# Patient Record
Sex: Female | Born: 1945 | Race: White | Hispanic: No | Smoking: Former smoker
Health system: Southern US, Community
[De-identification: ages and names within clinical notes are randomized; demographics above are authoritative.]

## PROBLEM LIST (undated history)

## (undated) DIAGNOSIS — R519 Headache, unspecified: Secondary | ICD-10-CM

## (undated) DIAGNOSIS — K219 Gastro-esophageal reflux disease without esophagitis: Secondary | ICD-10-CM

## (undated) DIAGNOSIS — H409 Unspecified glaucoma: Secondary | ICD-10-CM

## (undated) DIAGNOSIS — E785 Hyperlipidemia, unspecified: Secondary | ICD-10-CM

## (undated) DIAGNOSIS — I4719 Other supraventricular tachycardia: Secondary | ICD-10-CM

## (undated) DIAGNOSIS — H04129 Dry eye syndrome of unspecified lacrimal gland: Secondary | ICD-10-CM

## (undated) DIAGNOSIS — I10 Essential (primary) hypertension: Secondary | ICD-10-CM

## (undated) DIAGNOSIS — Z8489 Family history of other specified conditions: Secondary | ICD-10-CM

## (undated) DIAGNOSIS — I471 Supraventricular tachycardia: Secondary | ICD-10-CM

## (undated) DIAGNOSIS — M5134 Other intervertebral disc degeneration, thoracic region: Secondary | ICD-10-CM

## (undated) DIAGNOSIS — C539 Malignant neoplasm of cervix uteri, unspecified: Secondary | ICD-10-CM

## (undated) DIAGNOSIS — Z923 Personal history of irradiation: Secondary | ICD-10-CM

## (undated) DIAGNOSIS — C4490 Unspecified malignant neoplasm of skin, unspecified: Secondary | ICD-10-CM

## (undated) HISTORY — PX: ESOPHAGOGASTRODUODENOSCOPY: SHX1529

## (undated) HISTORY — PX: TONSILLECTOMY: SUR1361

## (undated) HISTORY — PX: EYE SURGERY: SHX253

## (undated) HISTORY — PX: CATARACT EXTRACTION W/ INTRAOCULAR LENS  IMPLANT, BILATERAL: SHX1307

## (undated) HISTORY — PX: BREAST CYST ASPIRATION: SHX578

## (undated) HISTORY — PX: BREAST BIOPSY: SHX20

## (undated) HISTORY — PX: OOPHORECTOMY: SHX86

## (undated) HISTORY — PX: ABDOMINAL HYSTERECTOMY: SHX81

---

## 1986-05-09 DIAGNOSIS — C539 Malignant neoplasm of cervix uteri, unspecified: Secondary | ICD-10-CM

## 1986-05-09 HISTORY — DX: Malignant neoplasm of cervix uteri, unspecified: C53.9

## 2012-05-09 HISTORY — PX: SHOULDER SURGERY: SHX246

## 2016-02-05 IMAGING — US US EXTREM UP*L* LTD
1 series · 8 of 8 positions shown · non-contrast
Comparison: None.

CLINICAL DATA: Left hand pain and swelling since a cat bite
yesterday.

EXAM:
ULTRASOUND LEFT UPPER EXTREMITY LIMITED
TECHNIQUE: Ultrasound examination of the upper extremity soft tissues was
performed in the area of clinical concern.

[Series 1: us extrem up*left* ltd · 0.07mm/px · 8 of 8 slices shown]
[im 1/8]
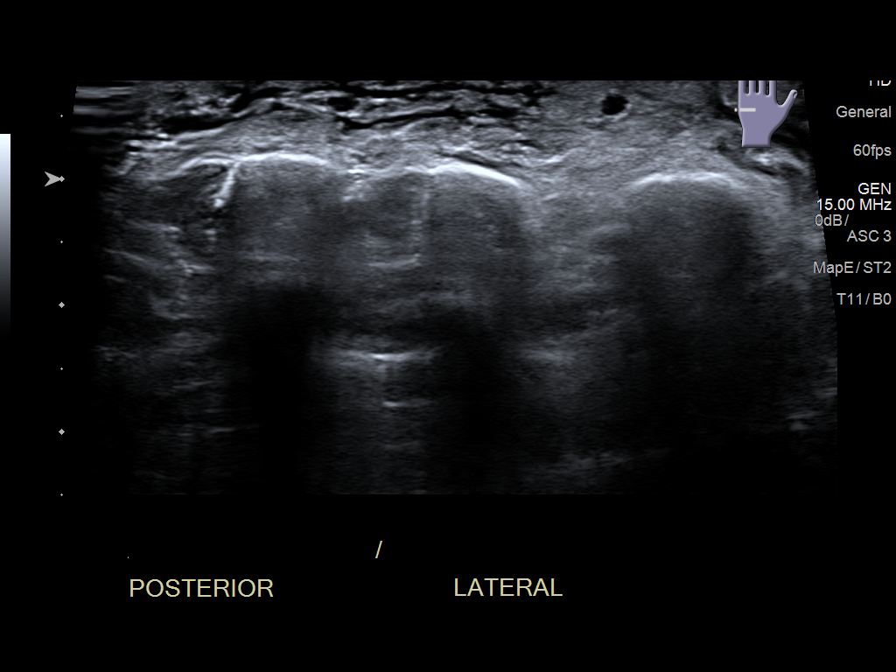
[im 2/8]
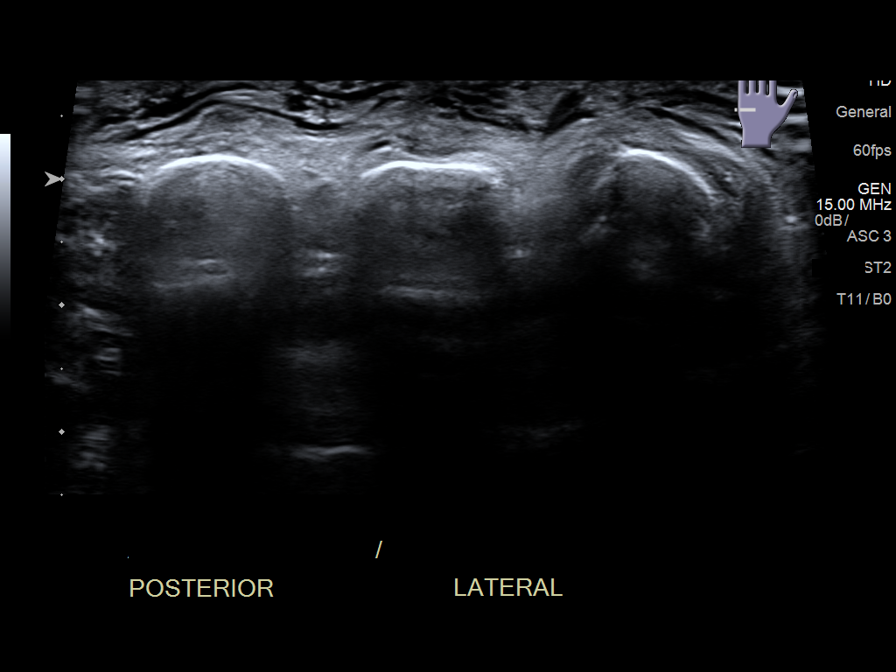
[im 3/8]
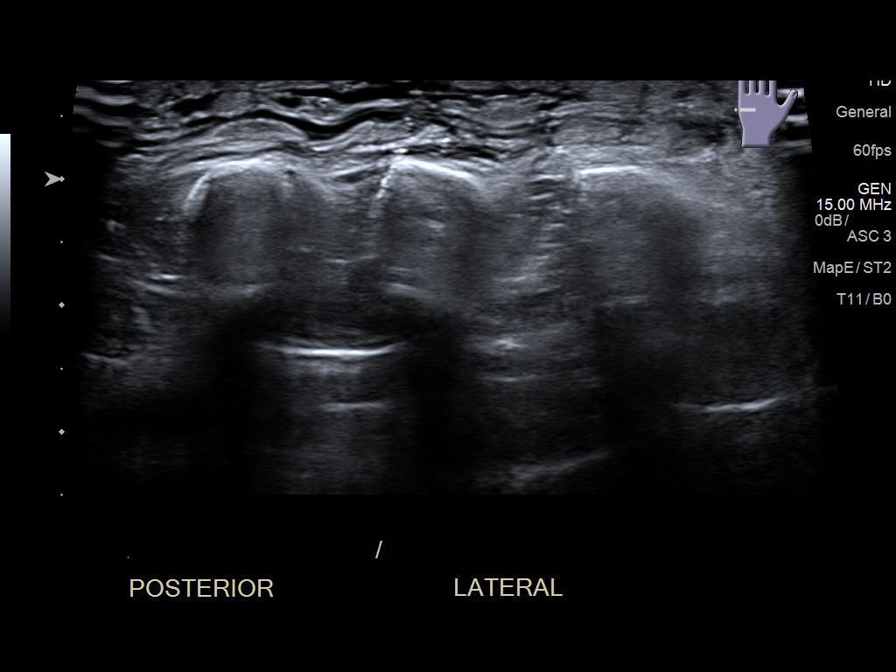
[im 4/8]
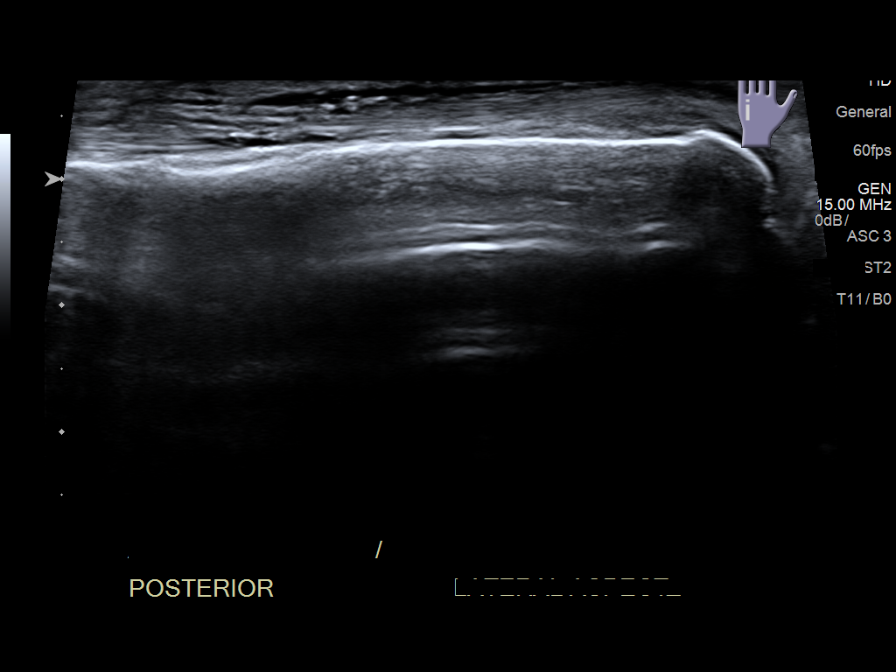
[im 5/8]
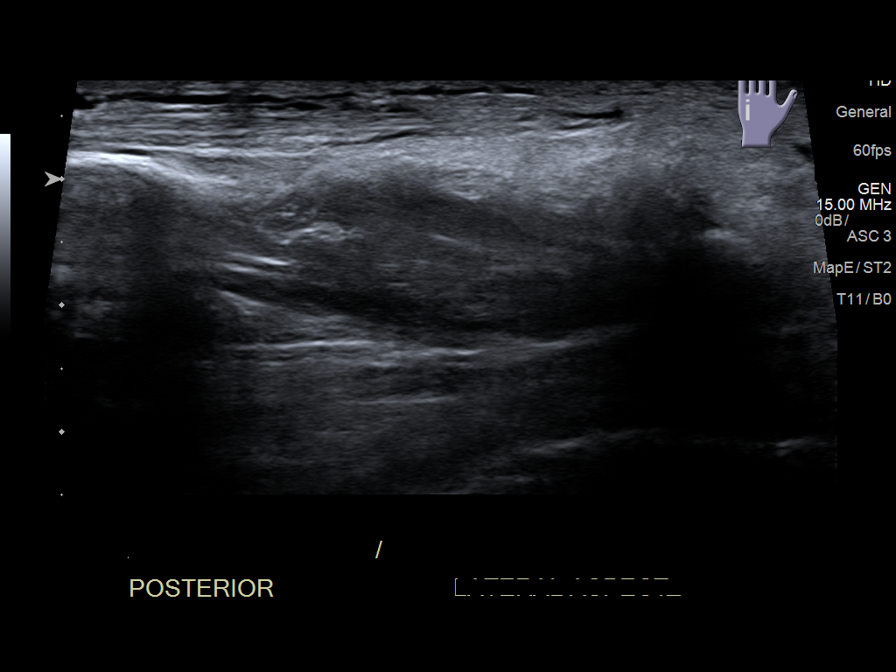
[im 6/8]
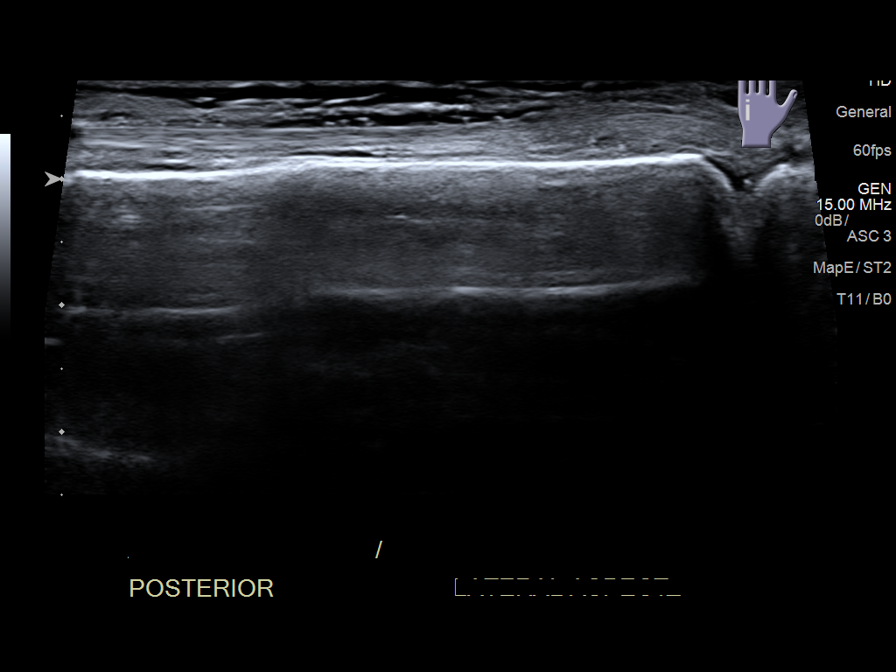
[im 7/8]
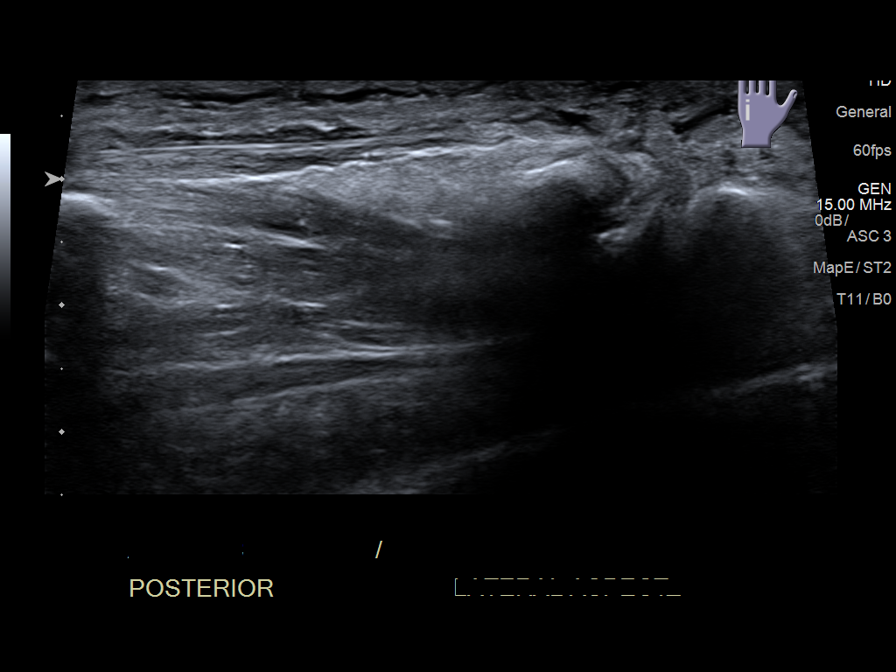
[im 8/8]
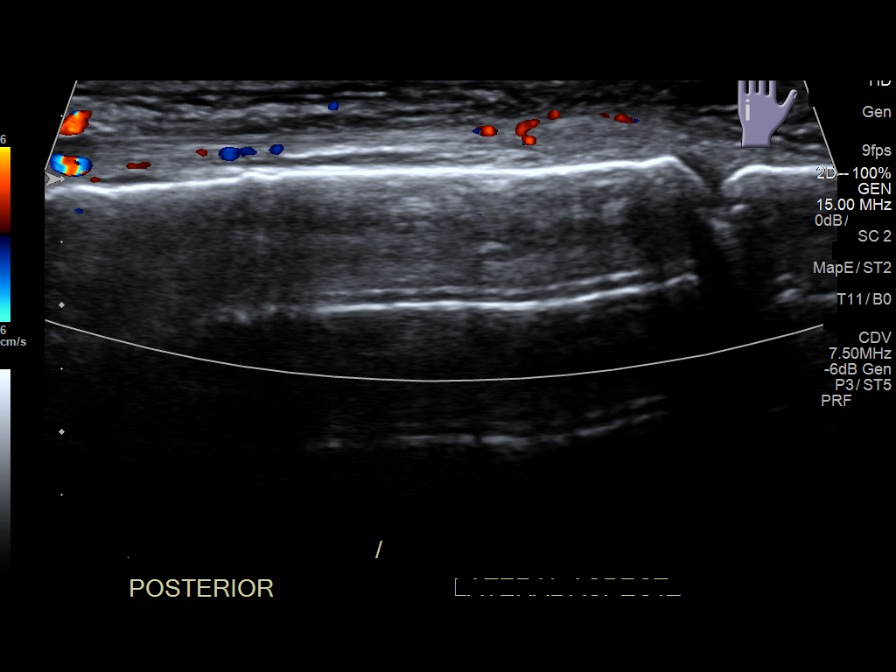

[8 of 8 positions shown; findings below may reference images not displayed]

FINDINGS: Extensive subcutaneous edema is present but no focal fluid
collection is identified.
IMPRESSION: Extensive subcutaneous edema consistent with cellulitis. Negative
for abscess.

## 2016-05-09 HISTORY — PX: COLONOSCOPY: SHX174

## 2016-10-06 DIAGNOSIS — E785 Hyperlipidemia, unspecified: Secondary | ICD-10-CM | POA: Insufficient documentation

## 2016-10-06 DIAGNOSIS — E782 Mixed hyperlipidemia: Secondary | ICD-10-CM | POA: Insufficient documentation

## 2016-10-12 DIAGNOSIS — M545 Low back pain, unspecified: Secondary | ICD-10-CM | POA: Insufficient documentation

## 2016-10-12 DIAGNOSIS — M533 Sacrococcygeal disorders, not elsewhere classified: Secondary | ICD-10-CM | POA: Insufficient documentation

## 2017-05-16 ENCOUNTER — Emergency Department: Payer: Medicare Other

## 2017-05-16 ENCOUNTER — Encounter: Payer: Self-pay | Admitting: Intensive Care

## 2017-05-16 ENCOUNTER — Observation Stay
Admission: EM | Admit: 2017-05-16 | Discharge: 2017-05-17 | Disposition: A | Discharge status: Home / Self Care | Payer: Medicare Other | Attending: Internal Medicine | Admitting: Internal Medicine

## 2017-05-16 DIAGNOSIS — I10 Essential (primary) hypertension: Secondary | ICD-10-CM | POA: Insufficient documentation

## 2017-05-16 DIAGNOSIS — L03114 Cellulitis of left upper limb: Principal | ICD-10-CM | POA: Insufficient documentation

## 2017-05-16 DIAGNOSIS — M1990 Unspecified osteoarthritis, unspecified site: Secondary | ICD-10-CM | POA: Diagnosis not present

## 2017-05-16 DIAGNOSIS — W5501XA Bitten by cat, initial encounter: Secondary | ICD-10-CM | POA: Diagnosis not present

## 2017-05-16 DIAGNOSIS — Z79899 Other long term (current) drug therapy: Secondary | ICD-10-CM | POA: Diagnosis not present

## 2017-05-16 DIAGNOSIS — Z880 Allergy status to penicillin: Secondary | ICD-10-CM | POA: Insufficient documentation

## 2017-05-16 DIAGNOSIS — Z91030 Bee allergy status: Secondary | ICD-10-CM | POA: Insufficient documentation

## 2017-05-16 DIAGNOSIS — Z888 Allergy status to other drugs, medicaments and biological substances status: Secondary | ICD-10-CM | POA: Diagnosis not present

## 2017-05-16 DIAGNOSIS — Z87891 Personal history of nicotine dependence: Secondary | ICD-10-CM | POA: Insufficient documentation

## 2017-05-16 DIAGNOSIS — Y939 Activity, unspecified: Secondary | ICD-10-CM | POA: Insufficient documentation

## 2017-05-16 DIAGNOSIS — Z91041 Radiographic dye allergy status: Secondary | ICD-10-CM | POA: Diagnosis not present

## 2017-05-16 DIAGNOSIS — L0390 Cellulitis, unspecified: Secondary | ICD-10-CM | POA: Diagnosis present

## 2017-05-16 DIAGNOSIS — M5134 Other intervertebral disc degeneration, thoracic region: Secondary | ICD-10-CM | POA: Insufficient documentation

## 2017-05-16 DIAGNOSIS — Z8541 Personal history of malignant neoplasm of cervix uteri: Secondary | ICD-10-CM | POA: Insufficient documentation

## 2017-05-16 DIAGNOSIS — L03119 Cellulitis of unspecified part of limb: Secondary | ICD-10-CM | POA: Diagnosis present

## 2017-05-16 HISTORY — DX: Other intervertebral disc degeneration, thoracic region: M51.34

## 2017-05-16 HISTORY — DX: Malignant neoplasm of cervix uteri, unspecified: C53.9

## 2017-05-16 HISTORY — DX: Essential (primary) hypertension: I10

## 2017-05-16 LAB — LACTIC ACID, PLASMA: LACTIC ACID, VENOUS: 0.8 mmol/L (ref 0.5–1.9)

## 2017-05-16 LAB — COMPREHENSIVE METABOLIC PANEL
ALT: 18 U/L (ref 14–54)
AST: 35 U/L (ref 15–41)
Albumin: 4.6 g/dL (ref 3.5–5.0)
Alkaline Phosphatase: 68 U/L (ref 38–126)
Anion gap: 11 (ref 5–15)
BUN: 17 mg/dL (ref 6–20)
CHLORIDE: 94 mmol/L — AB (ref 101–111)
CO2: 25 mmol/L (ref 22–32)
CREATININE: 0.77 mg/dL (ref 0.44–1.00)
Calcium: 9.1 mg/dL (ref 8.9–10.3)
Glucose, Bld: 105 mg/dL — ABNORMAL HIGH (ref 65–99)
Potassium: 3.4 mmol/L — ABNORMAL LOW (ref 3.5–5.1)
Sodium: 130 mmol/L — ABNORMAL LOW (ref 135–145)
TOTAL PROTEIN: 7.8 g/dL (ref 6.5–8.1)
Total Bilirubin: 1.2 mg/dL (ref 0.3–1.2)

## 2017-05-16 LAB — CBC WITH DIFFERENTIAL/PLATELET
BASOS ABS: 0.1 10*3/uL (ref 0–0.1)
Basophils Relative: 1 %
EOS ABS: 0 10*3/uL (ref 0–0.7)
EOS PCT: 0 %
HCT: 38.8 % (ref 35.0–47.0)
Hemoglobin: 13.2 g/dL (ref 12.0–16.0)
LYMPHS ABS: 2.1 10*3/uL (ref 1.0–3.6)
Lymphocytes Relative: 19 %
MCH: 29.7 pg (ref 26.0–34.0)
MCHC: 33.9 g/dL (ref 32.0–36.0)
MCV: 87.6 fL (ref 80.0–100.0)
Monocytes Absolute: 0.7 10*3/uL (ref 0.2–0.9)
Monocytes Relative: 7 %
Neutro Abs: 8 10*3/uL — ABNORMAL HIGH (ref 1.4–6.5)
Neutrophils Relative %: 73 %
PLATELETS: 339 10*3/uL (ref 150–440)
RBC: 4.43 MIL/uL (ref 3.80–5.20)
RDW: 14 % (ref 11.5–14.5)
WBC: 10.9 10*3/uL (ref 3.6–11.0)

## 2017-05-16 MED ORDER — CLINDAMYCIN PHOSPHATE 300 MG/2ML IJ SOLN: 600.0000 mg | Freq: Once | INTRAMUSCULAR | Status: DC

## 2017-05-16 MED ORDER — CLINDAMYCIN PHOSPHATE 600 MG/50ML IV SOLN
600.0000 mg | Freq: Once | INTRAVENOUS | Status: AC
  Administered 2017-05-16: 600 mg via INTRAVENOUS
  Filled 2017-05-16: qty 50

## 2017-05-16 MED ORDER — SODIUM CHLORIDE 0.9 % IV BOLUS (SEPSIS)
1000.0000 mL | Freq: Once | INTRAVENOUS | Status: AC
  Administered 2017-05-16: 1000 mL via INTRAVENOUS

## 2017-05-16 NOTE — ED Provider Notes (Signed)
[facility] Emergency Department Provider Note  ____________________________________________  Time seen: Approximately 10:17 PM  I have reviewed the triage vital signs and the nursing notes.   HISTORY  Chief Complaint Animal Bite    HPI Megan Montgomery is a 72 y.o. female resents the emergency department complaining of worsening infection to her left hand.  Patient reports that she was bitten by her own cat yesterday, experienced pain and swelling and went to urgent care.  Patient reports that she is allergic to penicillin antibiotics and was placed on doxycycline and Flagyl for her cat bite.  Patient reports that today the swelling is worsening, doubling in size.  Patient reports that she has somewhat limited range of motion of her fingers just due to swelling.  She reports that there is some pain but it is not significant at this time.  She denies any fevers or chills, chest pain, shortness of breath, abdominal pain, nausea vomiting.  Patient is taken her dosing of antibiotics both last night as well as this morning.  She did not take her nighttime dose of antibiotics.  No other medications for this complaint.  No other complaints at this time.  Past Medical History:  Diagnosis Date  . Cervical cancer ([place])   . Degenerative disc disease, thoracic   . Hypertension     There are no active problems to display for this patient.   History reviewed. No pertinent surgical history.  Prior to Admission medications   Not on File    Allergies Bee venom; Ivp dye [iodinated diagnostic agents]; Penicillins; and Septra [sulfamethoxazole-trimethoprim]  History reviewed. No pertinent family history.  Social History Social History   Tobacco Use  . Smoking status: Former Smoker    Types: Cigarettes  . Smokeless tobacco: Never Used  Substance Use Topics  . Alcohol use: Yes    Comment: rarely  . Drug use: No     Review of Systems  Constitutional: No  fever/chills Eyes: No visual changes. No discharge ENT: No upper respiratory complaints. Cardiovascular: no chest pain. Respiratory: no cough. No SOB. Gastrointestinal: No abdominal pain.  No nausea, no vomiting.   Musculoskeletal: Negative for musculoskeletal pain. Skin: Positive for cat Bite to the left hand with surrounding erythema, edema.  Area is painful. Neurological: Negative for headaches, focal weakness or numbness. 10-point ROS otherwise negative.  ____________________________________________   PHYSICAL EXAM:  VITAL SIGNS: ED Triage Vitals [05/16/17 1850]  Enc Vitals Group     BP (!) 160/74     Pulse Rate 74     Resp 18     Temp (!) 97.1 F (36.2 C)     Temp Source Oral     SpO2 96 %     Weight      Height      Head Circumference      Peak Flow      Pain Score      Pain Loc      Pain Edu?      Excl. in [place]?      Constitutional: Alert and oriented. Well appearing and in no acute distress. Eyes: Conjunctivae are normal. PERRL. EOMI. Head: Atraumatic. Neck: No stridor.   Hematological/Lymphatic/Immunilogical: No cervical lymphadenopathy. Cardiovascular: Normal rate, regular rhythm. Normal S1 and S2.  Good peripheral circulation. Respiratory: Normal respiratory effort without tachypnea or retractions. Lungs CTAB. Good air entry to the bases with no decreased or absent breath sounds. Musculoskeletal: Full range of motion to all extremities. No gross deformities appreciated. Neurologic:  Normal speech and language. No gross focal neurologic deficits are appreciated.  Skin:  Skin is warm, dry and intact. No rash noted.  #4 punctate lesions noted to the left hand both to the dorsal and palmar aspect.  This is consistent with reported cat bite.  Area has significant erythema and edema to the palmar and dorsal aspect of the hand.  This is marked in indelible marker on arrival.  Erythema and edema extends from the proximal phalanx of the fourth, fifth, third digits  through the hand to the distal wrist region.  Radial pulse intact.  Station and cap refill intact all digits.  There is no fluctuance or induration.  No pustular drainage.  Punctate lesions have scabbing over them. Psychiatric: Mood and affect are normal. Speech and behavior are normal. Patient exhibits appropriate insight and judgement.   ____________________________________________   LABS (all labs ordered are listed, but only abnormal results are displayed)  Labs Reviewed  COMPREHENSIVE METABOLIC PANEL - Abnormal; Notable for the following components:      Result Value   Sodium 130 (*)    Potassium 3.4 (*)    Chloride 94 (*)    Glucose, Bld 105 (*)    All other components within normal limits  CBC WITH DIFFERENTIAL/PLATELET - Abnormal; Notable for the following components:   Neutro Abs 8.0 (*)    All other components within normal limits  CULTURE, BLOOD (ROUTINE X 2)  CULTURE, BLOOD (ROUTINE X 2)  LACTIC ACID, PLASMA  LACTIC ACID, PLASMA   ____________________________________________  EKG   ____________________________________________  RADIOLOGY Diamantina Providence , personally viewed and evaluated these images (plain radiographs) as part of my medical decision making, as well as reviewing the written report by the radiologist.  [place] Lt Upper Extrem Ltd Soft Tissue Non Vascular  Result Date: 05/16/2017 CLINICAL DATA:  Left hand pain and swelling since a cat bite yesterday. EXAM: ULTRASOUND LEFT UPPER EXTREMITY LIMITED TECHNIQUE: Ultrasound examination of the upper extremity soft tissues was performed in the area of clinical concern. COMPARISON:  None. FINDINGS: Extensive subcutaneous edema is present but no focal fluid collection is identified. IMPRESSION: Extensive subcutaneous edema consistent with cellulitis. Negative for abscess. Electronically Signed   By: Inge Rise M.D.   On: 05/16/2017 21:25     ____________________________________________    PROCEDURES  Procedure(s) performed:    Procedures    Medications  sodium chloride 0.9 % bolus 1,000 mL (1,000 mLs Intravenous New Bag/Given 05/16/17 2028)  clindamycin (CLEOCIN) IVPB 600 mg (600 mg Intravenous New Bag/Given 05/16/17 2028)     ____________________________________________   INITIAL IMPRESSION / ASSESSMENT AND PLAN / ED COURSE  Pertinent labs & imaging results that were available during my care of the patient were reviewed by me and considered in my medical decision making (see chart for details).  Review of the [place] CSRS was performed in accordance of the [place] prior to dispensing any controlled drugs.     Patient's diagnosis is consistent with a cat bite resulting in significant cellulitis of the left hand.  Patient presented with worsening infection to the left hand status post cat bite.  Patient was on doxycycline and Flagyl as she was allergic to penicillins.  After drawing cultures, place the patient on IV clindamycin.  Ultrasound reveals no deep appreciable abscess.  At this time, in the 2 hours that patient had sent after IV antibiotics, cellulitis was worsening past markings on hand.  At this time, I feel that patient would best be suited  with IV antibiotics until there is improvement of cellulitis.  Discussed the case with my attending provider, Dr. Cinda Quest who agrees on course and hospital admission.  Hospitalist is advised of the patient and is agreeable for admission for further IV antibiotics.  Patient care is turned over to hospitalist service..      ____________________________________________  FINAL CLINICAL IMPRESSION(S) / ED DIAGNOSES  Final diagnoses:  Cellulitis  Cat bite, initial encounter  Cellulitis of hand      NEW MEDICATIONS STARTED DURING THIS VISIT:  ED Discharge Orders    None          This chart was dictated using voice recognition software/Dragon. Despite best  efforts to proofread, errors can occur which can change the meaning. Any change was purely unintentional.    Darletta Moll, PA-C 05/16/17 2227    Nena Polio, MD 05/16/17 [phone]

## 2017-05-16 NOTE — ED Triage Notes (Signed)
Patient reports getting bit by her cat yesterday and experienced swelling yesterday and went to urgent care and was prescribed doxycycline and metronidazole. Patient here today because she reports her swelling is getting worse. Reports cat has all shots up to date

## 2017-05-17 ENCOUNTER — Other Ambulatory Visit: Payer: Self-pay

## 2017-05-17 DIAGNOSIS — L03114 Cellulitis of left upper limb: Secondary | ICD-10-CM | POA: Diagnosis not present

## 2017-05-17 DIAGNOSIS — L0390 Cellulitis, unspecified: Secondary | ICD-10-CM | POA: Diagnosis present

## 2017-05-17 LAB — CBC
HCT: 35.6 % (ref 35.0–47.0)
HEMOGLOBIN: 12 g/dL (ref 12.0–16.0)
MCH: 29.9 pg (ref 26.0–34.0)
MCHC: 33.8 g/dL (ref 32.0–36.0)
MCV: 88.3 fL (ref 80.0–100.0)
Platelets: 299 10*3/uL (ref 150–440)
RBC: 4.03 MIL/uL (ref 3.80–5.20)
RDW: 14.1 % (ref 11.5–14.5)
WBC: 7.3 10*3/uL (ref 3.6–11.0)

## 2017-05-17 LAB — BASIC METABOLIC PANEL
ANION GAP: 8 (ref 5–15)
BUN: 12 mg/dL (ref 6–20)
CALCIUM: 8.5 mg/dL — AB (ref 8.9–10.3)
CHLORIDE: 100 mmol/L — AB (ref 101–111)
CO2: 27 mmol/L (ref 22–32)
Creatinine, Ser: 0.74 mg/dL (ref 0.44–1.00)
GFR calc non Af Amer: >60 mL/min (ref >60)
Glucose, Bld: 97 mg/dL (ref 65–99)
Potassium: 3.3 mmol/L — ABNORMAL LOW (ref 3.5–5.1)
Sodium: 135 mmol/L (ref 135–145)

## 2017-05-17 MED ORDER — GLUCOSAMINE CHOND MSM FORMULA PO TABS: 1.0000 | ORAL_TABLET | Freq: Every day | ORAL | Status: DC

## 2017-05-17 MED ORDER — HYDROCHLOROTHIAZIDE 25 MG PO TABS
25.0000 mg | ORAL_TABLET | Freq: Every day | ORAL | Status: DC
  Administered 2017-05-17: 09:00:00 25 mg via ORAL
  Filled 2017-05-17: qty 1

## 2017-05-17 MED ORDER — HEPARIN SODIUM (PORCINE) 5000 UNIT/ML IJ SOLN
5000.0000 [IU] | Freq: Three times a day (TID) | INTRAMUSCULAR | Status: DC
  Administered 2017-05-17: 5000 [IU] via SUBCUTANEOUS
  Filled 2017-05-17: qty 1

## 2017-05-17 MED ORDER — BISACODYL 5 MG PO TBEC: 5.0000 mg | DELAYED_RELEASE_TABLET | Freq: Every day | ORAL | Status: DC | PRN

## 2017-05-17 MED ORDER — DOCUSATE SODIUM 100 MG PO CAPS
100.0000 mg | ORAL_CAPSULE | Freq: Two times a day (BID) | ORAL | Status: DC
  Administered 2017-05-17: 09:00:00 100 mg via ORAL
  Filled 2017-05-17: qty 1

## 2017-05-17 MED ORDER — CLINDAMYCIN PHOSPHATE 300 MG/50ML IV SOLN
300.0000 mg | Freq: Four times a day (QID) | INTRAVENOUS | Status: DC
  Administered 2017-05-17 (×2): 300 mg via INTRAVENOUS
  Filled 2017-05-17 (×4): qty 50

## 2017-05-17 MED ORDER — SIMVASTATIN 20 MG PO TABS: 10.0000 mg | ORAL_TABLET | Freq: Every day | ORAL | Status: DC

## 2017-05-17 MED ORDER — ACETAMINOPHEN 650 MG RE SUPP
650.0000 mg | Freq: Four times a day (QID) | RECTAL | Status: DC | PRN
Start: 2017-05-17 — End: 2017-05-17

## 2017-05-17 MED ORDER — IBANDRONATE SODIUM 150 MG PO TABS: 150.0000 mg | ORAL_TABLET | ORAL | Status: DC

## 2017-05-17 MED ORDER — AMOXICILLIN-POT CLAVULANATE 875-125 MG PO TABS: 1.0000 | ORAL_TABLET | Freq: Two times a day (BID) | ORAL | 0 refills | Status: AC

## 2017-05-17 MED ORDER — TRAZODONE HCL 50 MG PO TABS: 25.0000 mg | ORAL_TABLET | Freq: Every evening | ORAL | Status: DC | PRN

## 2017-05-17 MED ORDER — NAPROXEN SODIUM 220 MG PO TABS: 220.0000 mg | ORAL_TABLET | Freq: Every day | ORAL | Status: DC | PRN

## 2017-05-17 MED ORDER — CALCIUM CARBONATE-VITAMIN D 500-200 MG-UNIT PO TABS
1.0000 | ORAL_TABLET | Freq: Two times a day (BID) | ORAL | Status: DC
  Administered 2017-05-17: 1 via ORAL
  Filled 2017-05-17: qty 1

## 2017-05-17 MED ORDER — HYDROCODONE-ACETAMINOPHEN 5-325 MG PO TABS: 1.0000 | ORAL_TABLET | ORAL | Status: DC | PRN

## 2017-05-17 MED ORDER — ONDANSETRON HCL 4 MG/2ML IJ SOLN: 4.0000 mg | Freq: Four times a day (QID) | INTRAMUSCULAR | Status: DC | PRN

## 2017-05-17 MED ORDER — MELATONIN 5 MG PO TABS
1.0000 | ORAL_TABLET | Freq: Every day | ORAL | Status: DC | PRN
  Filled 2017-05-17: qty 1

## 2017-05-17 MED ORDER — DEXTROSE 5 % IV SOLN
1.0000 g | INTRAVENOUS | Status: DC
  Administered 2017-05-17: 04:00:00 1 g via INTRAVENOUS
  Filled 2017-05-17 (×2): qty 10

## 2017-05-17 MED ORDER — LISINOPRIL 20 MG PO TABS
20.0000 mg | ORAL_TABLET | Freq: Every day | ORAL | Status: DC
  Administered 2017-05-17: 20 mg via ORAL
  Filled 2017-05-17: qty 1

## 2017-05-17 MED ORDER — CLINDAMYCIN HCL 150 MG PO CAPS: 150.0000 mg | ORAL_CAPSULE | Freq: Four times a day (QID) | ORAL | 0 refills | Status: DC

## 2017-05-17 MED ORDER — ONDANSETRON HCL 4 MG PO TABS: 4.0000 mg | ORAL_TABLET | Freq: Four times a day (QID) | ORAL | Status: DC | PRN

## 2017-05-17 MED ORDER — ACETAMINOPHEN 325 MG PO TABS: 650.0000 mg | ORAL_TABLET | Freq: Four times a day (QID) | ORAL | Status: DC | PRN

## 2017-05-17 MED ORDER — ADULT MULTIVITAMIN W/MINERALS CH
1.0000 | ORAL_TABLET | Freq: Every day | ORAL | Status: DC
  Administered 2017-05-17: 1 via ORAL
  Filled 2017-05-17: qty 1

## 2017-05-17 MED ORDER — NAPROXEN 250 MG PO TABS
250.0000 mg | ORAL_TABLET | Freq: Every day | ORAL | Status: DC | PRN
  Filled 2017-05-17: qty 1

## 2017-05-17 MED ORDER — PANTOPRAZOLE SODIUM 40 MG PO TBEC
40.0000 mg | DELAYED_RELEASE_TABLET | Freq: Every day | ORAL | Status: DC
  Administered 2017-05-17: 09:00:00 40 mg via ORAL
  Filled 2017-05-17: qty 1

## 2017-05-17 NOTE — ED Notes (Signed)
Pt brought a sandwich and has a drink. Pt in NAD at this time.

## 2017-05-17 NOTE — Plan of Care (Signed)
  Clinical Measurements: Ability to avoid or minimize complications of infection will improve 05/17/2017 0609 - Progressing by Jeffie Pollock, RN   Skin Integrity: Skin integrity will improve 05/17/2017 0609 - Progressing by Jeffie Pollock, RN   Education: Knowledge of General Education information will improve 05/17/2017 [phone] - Progressing by Jeffie Pollock, RN   Pain Managment: General experience of comfort will improve 05/17/2017 0609 - Progressing by Jeffie Pollock, RN

## 2017-05-17 NOTE — Discharge Instructions (Signed)
Animal Bite Animal bite wounds can get infected. It is important to get proper medical treatment. Ask your doctor if you need rabies treatment. Follow these instructions at home: Wound care  Follow instructions from your doctor about how to take care of your wound. Make sure you: ? Wash your hands with soap and water before you change your bandage (dressing). If you cannot use soap and water, use hand sanitizer. ? Change your bandage as told by your doctor. ? Leave stitches (sutures), skin glue, or skin tape (adhesive) strips in place. They may need to stay in place for 2 weeks or longer. If tape strips get loose and curl up, you may trim the loose edges. Do not remove tape strips completely unless your doctor says it is okay.  Check your wound every day for signs of infection. Watch for: ? Redness, swelling, or pain that gets worse. ? Fluid, blood, or pus. General instructions  Take or apply over-the-counter and prescription medicines only as told by your doctor.  If you were prescribed an antibiotic, take or apply it as told by your doctor. Do not stop using the antibiotic even if your condition improves.  Keep the injured area raised (elevated) above the level of your heart while you are sitting or lying down.  If directed, apply ice to the injured area. ? Put ice in a plastic bag. ? Place a towel between your skin and the bag. ? Leave the ice on for 20 minutes, 2-3 times per day.  Keep all follow-up visits as told by your doctor. This is important. Contact a doctor if:  You have redness, swelling, or pain that gets worse.  You have a general feeling of sickness (malaise).  You feel sick to your stomach (nauseous).  You throw up (vomit).  You have pain that does not get better. Get help right away if:  You have a red streak going away from your wound.  You have fluid, blood, or pus coming from your wound.  You have a fever or chills.  You have trouble moving your  injured area.  You have numbness or tingling anywhere on your body. This information is not intended to replace advice given to you by your health care provider. Make sure you discuss any questions you have with your health care provider. Document Released: 04/25/2005 Document Revised: 10/01/2015 Document Reviewed: 09/10/2014 Elsevier Interactive Patient Education  2018 Elsevier Inc.  

## 2017-05-17 NOTE — H&P (Signed)
[facility] [place] NAME: Megan Montgomery    MR#:  [id number]  DATE OF BIRTH:  04-14-1946  DATE OF ADMISSION:  05/16/2017  PRIMARY CARE PHYSICIAN: Valera Castle, MD   REQUESTING/REFERRING PHYSICIAN:   CHIEF COMPLAINT:   Chief Complaint  Patient presents with  . Animal Bite    HISTORY OF PRESENT ILLNESS: Megan Montgomery  is a 72 y.o. female with a known history of HTN and osteoarthritis. Patient presented to emergency room status post cat bite at the left hand, that happened 2 days ago, at home.  Patient initially went to urgent care yesterday and she was prescribed Flagyl and doxycycline.  Unfortunately, the swelling and redness at the left hand and forearm worsened overnight and patient decided to present to emergency room for further evaluation.  Left upper extremity ultrasound done in the emergency room showed extensive subcutaneous edema consistent with cellulitis no abscess.  Patient is admitted for IV antibiotics.  PAST MEDICAL HISTORY:   Past Medical History:  Diagnosis Date  . Cervical cancer ([place])   . Degenerative disc disease, thoracic   . Hypertension     PAST SURGICAL HISTORY: History reviewed. No pertinent surgical history.  SOCIAL HISTORY:  Social History   Tobacco Use  . Smoking status: Former Smoker    Types: Cigarettes  . Smokeless tobacco: Never Used  Substance Use Topics  . Alcohol use: Yes    Comment: rarely    FAMILY HISTORY: History reviewed. No pertinent family history.  DRUG ALLERGIES:  Allergies  Allergen Reactions  . Bee Venom   . Ivp Dye [Iodinated Diagnostic Agents]   . Penicillins   . Septra [Sulfamethoxazole-Trimethoprim]     REVIEW OF SYSTEMS:   CONSTITUTIONAL: No fever, fatigue or weakness.  EYES: No blurred or double vision.  EARS, NOSE, AND THROAT: No tinnitus or ear pain.  RESPIRATORY: No cough, shortness of breath, wheezing or hemoptysis.  CARDIOVASCULAR: No chest pain,  orthopnea, edema.  GASTROINTESTINAL: No nausea, vomiting, diarrhea or abdominal pain.  GENITOURINARY: No dysuria, hematuria.  ENDOCRINE: No polyuria, nocturia,  HEMATOLOGY: No anemia, easy bruising or bleeding SKIN: No rash or lesion. MUSCULOSKELETAL: There is tenderness, erythema and edema at the left hand, extending to the left forearm.  No bleeding no drainage. NEUROLOGIC: No tingling, numbness, weakness.  PSYCHIATRY: No anxiety or depression.   MEDICATIONS AT HOME:  Prior to Admission medications   Medication Sig Start Date End Date Taking? Authorizing Provider  calcium-vitamin D (OSCAL WITH D) 500-200 MG-UNIT TABS tablet Take 1 tablet by mouth 2 (two) times daily. 11/11/16  Yes [provider]  doxycycline (VIBRAMYCIN) 100 MG capsule Take 1 capsule by mouth 2 (two) times daily. 05/15/17  Yes [provider]  hydrochlorothiazide (HYDRODIURIL) 25 MG tablet Take 1 tablet by mouth daily. 02/22/17  Yes [provider]  ibandronate (BONIVA) 150 MG tablet Take 1 tablet by mouth every 30 (thirty) days. 02/22/17  Yes [provider]  lisinopril (PRINIVIL,ZESTRIL) 20 MG tablet Take 1 tablet by mouth daily. 02/22/17  Yes [provider]  Melatonin 5 MG TABS Take 1 tablet by mouth daily as needed.   Yes [provider]  metroNIDAZOLE (FLAGYL) 500 MG tablet Take 1 tablet by mouth 3 (three) times daily. 05/15/17  Yes [provider]  Misc Natural Products ([place] MSM FORMULA) TABS Take 1 tablet by mouth daily.   Yes [provider]  Multiple Vitamins-Minerals (CENTRUM SILVER 50+WOMEN) TABS Take 1  tablet by mouth daily.   Yes [provider]  naproxen sodium (ALEVE) 220 MG tablet Take 220 mg by mouth daily as needed.   Yes [provider]  omeprazole (PRILOSEC) 40 MG capsule Take 1 capsule by mouth daily. 03/23/17  Yes [provider]  simvastatin (ZOCOR) 10 MG tablet Take 1 tablet by mouth daily.  03/08/17  Yes [provider]      PHYSICAL EXAMINATION:   VITAL SIGNS: Blood pressure (!) 149/66, pulse 71, temperature 98.3 F (36.8 C), temperature source Oral, resp. rate 18, height 5\' 6"  (1.676 m), weight 66.7 kg (147 lb), SpO2 99 %.  GENERAL:  72 y.o.-year-old patient lying in the bed with no acute distress.  EYES: Pupils equal, round, reactive to light and accommodation. No scleral icterus. Extraocular muscles intact.  HEENT: Head atraumatic, normocephalic. Oropharynx and nasopharynx clear.  NECK:  Supple, no jugular venous distention. No thyroid enlargement, no tenderness.  LUNGS: Normal breath sounds bilaterally, no wheezing, rales,rhonchi or crepitation. No use of accessory muscles of respiration.  CARDIOVASCULAR: S1, S2 normal. No murmurs, rubs, or gallops.  ABDOMEN: Soft, nontender, nondistended. Bowel sounds present. No organomegaly or mass.  EXTREMITIES: There is tenderness, erythema and edema at the left hand, extending to the left forearm.  No bleeding/drainage. NEUROLOGIC: Cranial nerves II through XII are intact. Muscle strength 5/5 in all extremities. Sensation intact. Gait not checked.  PSYCHIATRIC: The patient is alert and oriented x 3.  SKIN: No obvious rash, lesion, or ulcer.   LABORATORY PANEL:   CBC Recent Labs  Lab 05/16/17 1944  WBC 10.9  HGB 13.2  HCT 38.8  PLT 339  MCV 87.6  MCH 29.7  MCHC 33.9  RDW 14.0  LYMPHSABS 2.1  MONOABS 0.7  EOSABS 0.0  BASOSABS 0.1   ------------------------------------------------------------------------------------------------------------------  Chemistries  Recent Labs  Lab 05/16/17 1944  NA 130*  K 3.4*  CL 94*  CO2 25  GLUCOSE 105*  BUN 17  CREATININE 0.77  CALCIUM 9.1  AST 35  ALT 18  ALKPHOS 68  BILITOT 1.2   ------------------------------------------------------------------------------------------------------------------ estimated creatinine clearance is 59.5 mL/min (by C-G formula  based on SCr of 0.77 mg/dL). ------------------------------------------------------------------------------------------------------------------ No results for input(s): TSH, T4TOTAL, T3FREE, THYROIDAB in the last 72 hours.  Invalid input(s): FREET3   Coagulation profile No results for input(s): INR, PROTIME in the last 168 hours. ------------------------------------------------------------------------------------------------------------------- No results for input(s): DDIMER in the last 72 hours. -------------------------------------------------------------------------------------------------------------------  Cardiac Enzymes No results for input(s): CKMB, TROPONINI, MYOGLOBIN in the last 168 hours.  Invalid input(s): CK ------------------------------------------------------------------------------------------------------------------ Invalid input(s): POCBNP  ---------------------------------------------------------------------------------------------------------------  Urinalysis No results found for: COLORURINE, APPEARANCEUR, LABSPEC, [place], GLUCOSEU, HGBUR, BILIRUBINUR, KETONESUR, PROTEINUR, UROBILINOGEN, NITRITE, LEUKOCYTESUR   RADIOLOGY: [place] Lt Upper Extrem Ltd Soft Tissue Non Vascular  Result Date: 05/16/2017 CLINICAL DATA:  Left hand pain and swelling since a cat bite yesterday. EXAM: ULTRASOUND LEFT UPPER EXTREMITY LIMITED TECHNIQUE: Ultrasound examination of the upper extremity soft tissues was performed in the area of clinical concern. COMPARISON:  None. FINDINGS: Extensive subcutaneous edema is present but no focal fluid collection is identified. IMPRESSION: Extensive subcutaneous edema consistent with cellulitis. Negative for abscess. Electronically Signed   By: Inge Rise M.D.   On: 05/16/2017 21:25    EKG: No orders found for this or any previous visit.  IMPRESSION AND PLAN:   1.  Acute left upper extremity cellulitis, status post cat bite.  Patient failed  p.o. antibiotic treatment, as outpatient.  Will admit for IV antibiotics, started  on ceftriaxone and clindamycin.  We will continue to monitor clinically closely. 2.  Hypertension stable restart home medications.   All the records are reviewed and case discussed with ED provider. Management plans discussed with the patient, family and they are in agreement.  CODE STATUS:    Code Status Orders  (From admission, onward)        Start     Ordered   05/17/17 0216  Full code  Continuous     05/17/17 0215    Code Status History    Date Active Date Inactive Code Status Order ID Comments User Context   This patient has a current code status but no historical code status.    Advance Directive Documentation     Most Recent Value  Type of Advance Directive  Living will, Healthcare Power of Attorney  Pre-existing out of facility DNR order (yellow form or pink MOST form)  No data  "MOST" Form in Place?  No data       TOTAL TIME TAKING CARE OF THIS PATIENT: 30 minutes.    Amelia Jo M.D on 05/17/2017 at 4:40 AM  Between 7am to 6pm - Pager - [phone]  After 6pm go to [email or web address] - password [email or web address] [facility]  Office  [phone]  CC: Primary care physician; Valera Castle, MD

## 2017-05-17 NOTE — Progress Notes (Signed)
Received MD order to discharge patient to home reviewed homes meds discharge instructions and follow up appointment with patient and patient verbalized understanding

## 2017-05-17 NOTE — ED Notes (Signed)
Pt updated on status that nurse is waiting on admitting orders for room. Pt stating that a doctor did come by and see her. Nurse stating that she will check in on status. Pt up to restroom at this time. Pt in NAD and able to ambulate without assist.

## 2017-05-17 NOTE — ED Notes (Signed)
Told that they were not getting that patient. That bed placement was going to move them to a new floor.

## 2017-05-17 NOTE — Care Management CC44 (Signed)
Condition Code 44 Documentation Completed  Patient Details  Name: Megan Montgomery MRN: [id number] Date of Birth: [birth year]   Condition Code 44 given:  Yes Patient signature on Condition Code 44 notice:  Yes Documentation of 2 MD's agreement:  Yes Code 44 added to claim:  Yes    Shelbie Ammons, RN 05/17/2017, 11:46 AM

## 2017-05-17 NOTE — Progress Notes (Signed)
PHARMACIST - PHYSICIAN ORDER COMMUNICATION  CONCERNING: P&T Medication Policy on Herbal Medications  DESCRIPTION:  This patient's order for:  Glucosamine-chondroitin MSM  has been noted.  This product(s) is classified as an "herbal" or natural product. Due to a lack of definitive safety studies or FDA approval, nonstandard manufacturing practices, plus the potential risk of unknown drug-drug interactions while on inpatient medications, the Pharmacy and Therapeutics Committee does not permit the use of "herbal" or natural products of this type within [facility].   ACTION TAKEN: The pharmacy department is unable to verify this order at this time. Please reevaluate patient's clinical condition at discharge and address if the herbal or natural product(s) should be resumed at that time.

## 2017-05-17 NOTE — Discharge Summary (Signed)
[facility] NAME: Megan Montgomery    MR#:  [id number]  DATE OF BIRTH:  [birth year]  DATE OF ADMISSION:  05/16/2017   ADMITTING PHYSICIAN: Amelia Jo, MD  DATE OF DISCHARGE: 05/17/2017  1:24 PM  PRIMARY CARE PHYSICIAN: Valera Castle, MD   ADMISSION DIAGNOSIS:  Cellulitis of hand [A35.573] Cellulitis [L03.90] Cat bite, initial encounter [W55.01XA] DISCHARGE DIAGNOSIS:  Active Problems:   Cellulitis  SECONDARY DIAGNOSIS:   Past Medical History:  Diagnosis Date  . Cervical cancer ([place])   . Degenerative disc disease, thoracic   . Hypertension    HOSPITAL COURSE:  72 y.o. female with a known history of HTN and osteoarthritis admitted status post cat bite at the left hand, that happened 2 days ago, at home.  Patient initially went to urgent care yesterday and she was prescribed Flagyl and doxycycline.  Unfortunately, the swelling and redness at the left hand and forearm worsened overnight and patient decided to present to emergency room for further evaluation.  Left upper extremity ultrasound done in the emergency room showed extensive subcutaneous edema consistent with cellulitis no abscess.    Patient received IV antibiotics with significant improvement in her swelling and redness was completely resolved.  She remained afebrile with no leukocytosis.  After long discussion with patient and her son at bedside the choose to go home on oral antibiotics. DISCHARGE CONDITIONS:  stable CONSULTS OBTAINED:   DRUG ALLERGIES:   Allergies  Allergen Reactions  . Bee Venom   . Ivp Dye [Iodinated Diagnostic Agents]   . Penicillins   . Septra [Sulfamethoxazole-Trimethoprim]    DISCHARGE MEDICATIONS:   Allergies as of 05/17/2017      Reactions   Bee Venom    Ivp Dye [iodinated Diagnostic Agents]    Penicillins    Septra [sulfamethoxazole-trimethoprim]       Medication List    STOP taking these medications     doxycycline 100 MG capsule Commonly known as:  VIBRAMYCIN   metroNIDAZOLE 500 MG tablet Commonly known as:  FLAGYL     TAKE these medications   amoxicillin-clavulanate 875-125 MG tablet Commonly known as:  AUGMENTIN Take 1 tablet by mouth every 12 (twelve) hours for 7 days.   calcium-vitamin D 500-200 MG-UNIT Tabs tablet Commonly known as:  OSCAL WITH D Take 1 tablet by mouth 2 (two) times daily.   CENTRUM SILVER 50+WOMEN Tabs Take 1 tablet by mouth daily.   GLUCOSAMINE CHOND MSM FORMULA Tabs Take 1 tablet by mouth daily.   hydrochlorothiazide 25 MG tablet Commonly known as:  HYDRODIURIL Take 1 tablet by mouth daily.   ibandronate 150 MG tablet Commonly known as:  BONIVA Take 1 tablet by mouth every 30 (thirty) days.   lisinopril 20 MG tablet Commonly known as:  PRINIVIL,ZESTRIL Take 1 tablet by mouth daily.   Melatonin 5 MG Tabs Take 1 tablet by mouth daily as needed.   naproxen sodium 220 MG tablet Commonly known as:  ALEVE Take 220 mg by mouth daily as needed.   omeprazole 40 MG capsule Commonly known as:  PRILOSEC Take 1 capsule by mouth daily.   simvastatin 10 MG tablet Commonly known as:  ZOCOR Take 1 tablet by mouth daily.        DISCHARGE INSTRUCTIONS:   DIET:  Regular diet DISCHARGE CONDITION:  Good ACTIVITY:  Activity as tolerated OXYGEN:  Home Oxygen: No.  Oxygen Delivery: room air DISCHARGE  LOCATION:  home   If you experience worsening of your admission symptoms, develop shortness of breath, life threatening emergency, suicidal or homicidal thoughts you must seek medical attention immediately by calling 911 or calling your MD immediately  if symptoms less severe.  You Must read complete instructions/literature along with all the possible adverse reactions/side effects for all the Medicines you take and that have been prescribed to you. Take any new Medicines after you have completely understood and accpet all the possible adverse  reactions/side effects.   Please note  You were cared for by a hospitalist during your hospital stay. If you have any questions about your discharge medications or the care you received while you were in the hospital after you are discharged, you can call the unit and asked to speak with the hospitalist on call if the hospitalist that took care of you is not available. Once you are discharged, your primary care physician will handle any further medical issues. Please note that NO REFILLS for any discharge medications will be authorized once you are discharged, as it is imperative that you return to your primary care physician (or establish a relationship with a primary care physician if you do not have one) for your aftercare needs so that they can reassess your need for medications and monitor your lab values.    On the day of Discharge:  VITAL SIGNS:  Blood pressure 131/60, pulse 73, temperature 98.2 F (36.8 C), temperature source Oral, resp. rate 16, height 5\' 6"  (1.676 m), weight 66.7 kg (147 lb), SpO2 99 %. PHYSICAL EXAMINATION:  GENERAL:  72 y.o.-year-old patient lying in the bed with no acute distress.  EYES: Pupils equal, round, reactive to light and accommodation. No scleral icterus. Extraocular muscles intact.  HEENT: Head atraumatic, normocephalic. Oropharynx and nasopharynx clear.  NECK:  Supple, no jugular venous distention. No thyroid enlargement, no tenderness.  LUNGS: Normal breath sounds bilaterally, no wheezing, rales,rhonchi or crepitation. No use of accessory muscles of respiration.  CARDIOVASCULAR: S1, S2 normal. No murmurs, rubs, or gallops.  ABDOMEN: Soft, non-tender, non-distended. Bowel sounds present. No organomegaly or mass.  EXTREMITIES: No pedal edema, cyanosis, or clubbing.  NEUROLOGIC: Cranial nerves II through XII are intact. Muscle strength 5/5 in all extremities. Sensation intact. Gait not checked.  PSYCHIATRIC: The patient is alert and oriented x 3.  SKIN:  No obvious rash, lesion, or ulcer.  DATA REVIEW:   CBC Recent Labs  Lab 05/17/17 0537  WBC 7.3  HGB 12.0  HCT 35.6  PLT 299    Chemistries  Recent Labs  Lab 05/16/17 1944 05/17/17 0537  NA 130* 135  K 3.4* 3.3*  CL 94* 100*  CO2 25 27  GLUCOSE 105* 97  BUN 17 12  CREATININE 0.77 0.74  CALCIUM 9.1 8.5*  AST 35  --   ALT 18  --   ALKPHOS 68  --   BILITOT 1.2  --      Microbiology Results   [place] Lt Upper Extrem Ltd Soft Tissue Non Vascular  Result Date: 05/16/2017 CLINICAL DATA:  Left hand pain and swelling since a cat bite yesterday. EXAM: ULTRASOUND LEFT UPPER EXTREMITY LIMITED TECHNIQUE: Ultrasound examination of the upper extremity soft tissues was performed in the area of clinical concern. COMPARISON:  None. FINDINGS: Extensive subcutaneous edema is present but no focal fluid collection is identified. IMPRESSION: Extensive subcutaneous edema consistent with cellulitis. Negative for abscess. Electronically Signed   By: Inge Rise M.D.   On: 05/16/2017 21:25  Follow-up Information    Kym Groom Guy Begin, MD. Schedule an appointment as soon as possible for a visit on 05/24/2017.   Specialty:  Family Medicine Why:  Please keep your follow up appointment 1:00pm  Contact information: [place] [address] [phone]        Leonel Ramsay, MD. Schedule an appointment as soon as possible for a visit in 1 week.   Specialty:  Infectious Diseases Why:  Please make this appointment as soon as possible Contact information: [place] [address] [phone]            Management plans discussed with the patient, family and they are in agreement.  CODE STATUS: Prior   TOTAL TIME TAKING CARE OF THIS PATIENT: 45 minutes.    Max Sane M.D on 05/17/2017 at 5:51 PM  Between 7am to 6pm - Pager - [phone]  After 6pm go to [email or web address] - Proofreader  [facility]  Office   [phone]  CC: Primary care physician; Valera Castle, MD   Note: This dictation was prepared with Dragon dictation along with smaller phrase technology. Any transcriptional errors that result from this process are unintentional.

## 2017-05-21 LAB — CULTURE, BLOOD (ROUTINE X 2)
CULTURE: NO GROWTH
CULTURE: NO GROWTH
Special Requests: ADEQUATE

## 2017-08-08 ENCOUNTER — Other Ambulatory Visit: Payer: Self-pay | Admitting: Family Medicine

## 2017-08-08 DIAGNOSIS — Z1231 Encounter for screening mammogram for malignant neoplasm of breast: Secondary | ICD-10-CM

## 2017-08-16 ENCOUNTER — Ambulatory Visit
Admission: RE | Admit: 2017-08-16 | Discharge: 2017-08-16 | Disposition: A | Discharge status: Home / Self Care | Payer: Medicare Other | Source: Ambulatory Visit | Attending: Family Medicine | Admitting: Family Medicine

## 2017-08-16 DIAGNOSIS — Z1231 Encounter for screening mammogram for malignant neoplasm of breast: Secondary | ICD-10-CM | POA: Insufficient documentation

## 2017-08-16 IMAGING — MG MM DIGITAL SCREENING BILAT W/ CAD
4 series · 4 of 4 positions shown · non-contrast
Comparison: Previous exam(s).

CLINICAL DATA: Screening.

EXAM:
DIGITAL SCREENING BILATERAL MAMMOGRAM WITH CAD

[R CC]
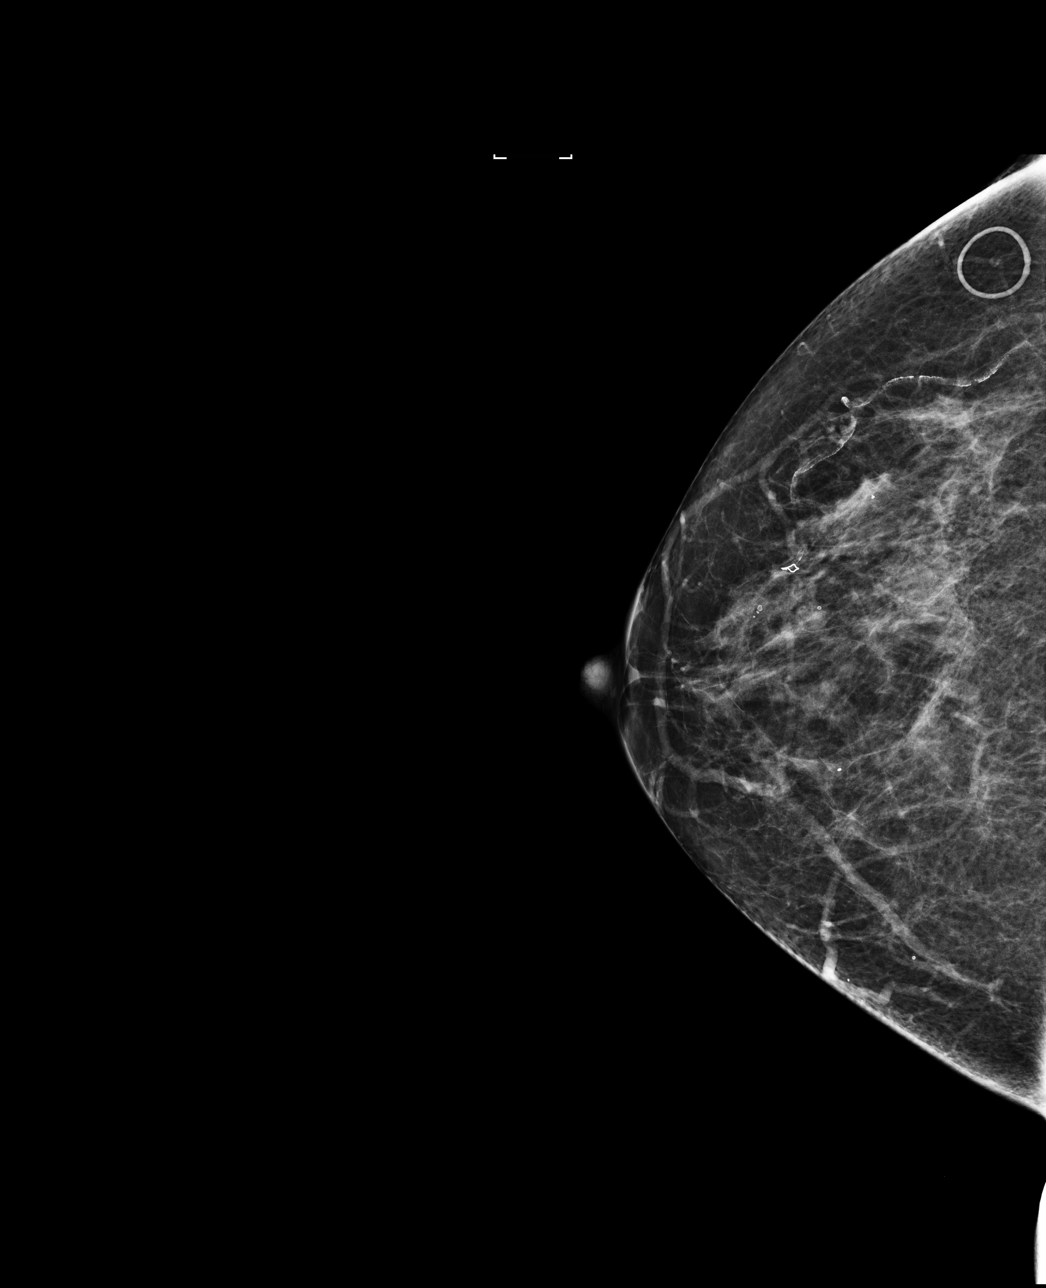

[L CC]
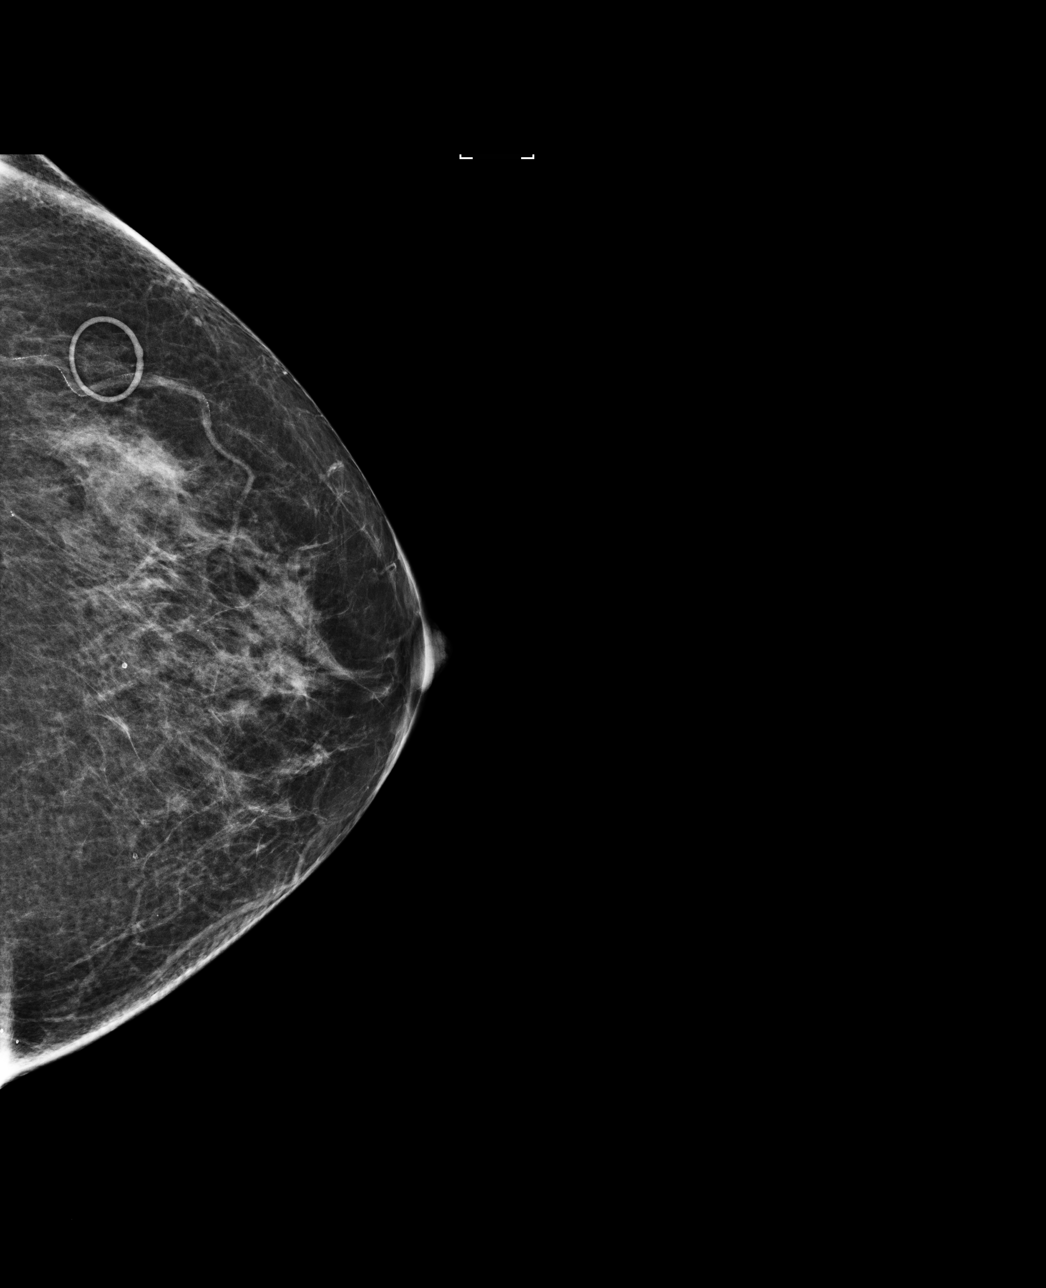

[L MLO]
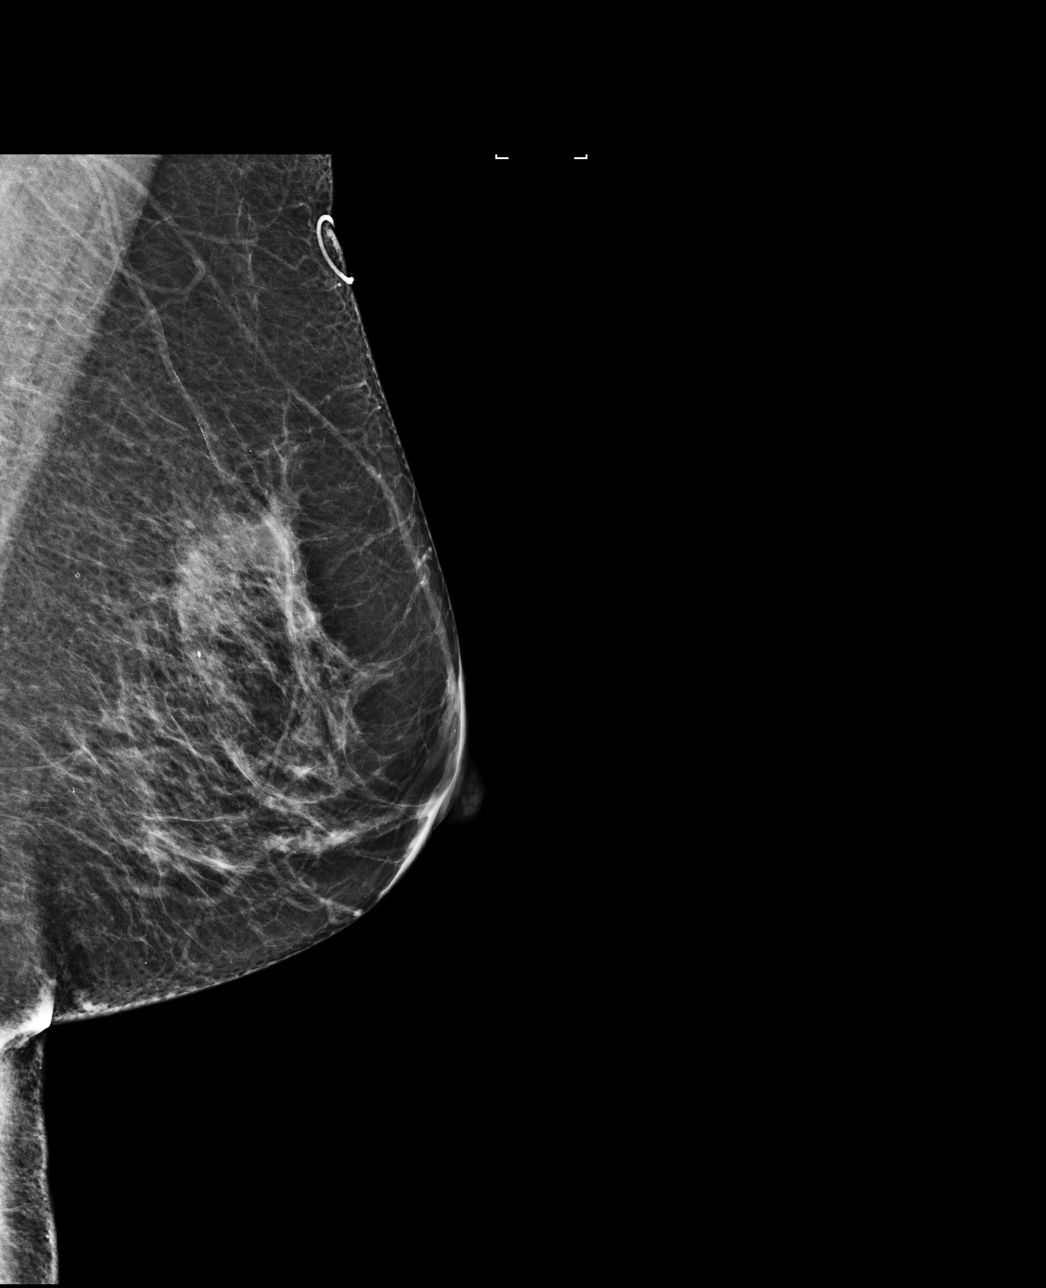

[R MLO]
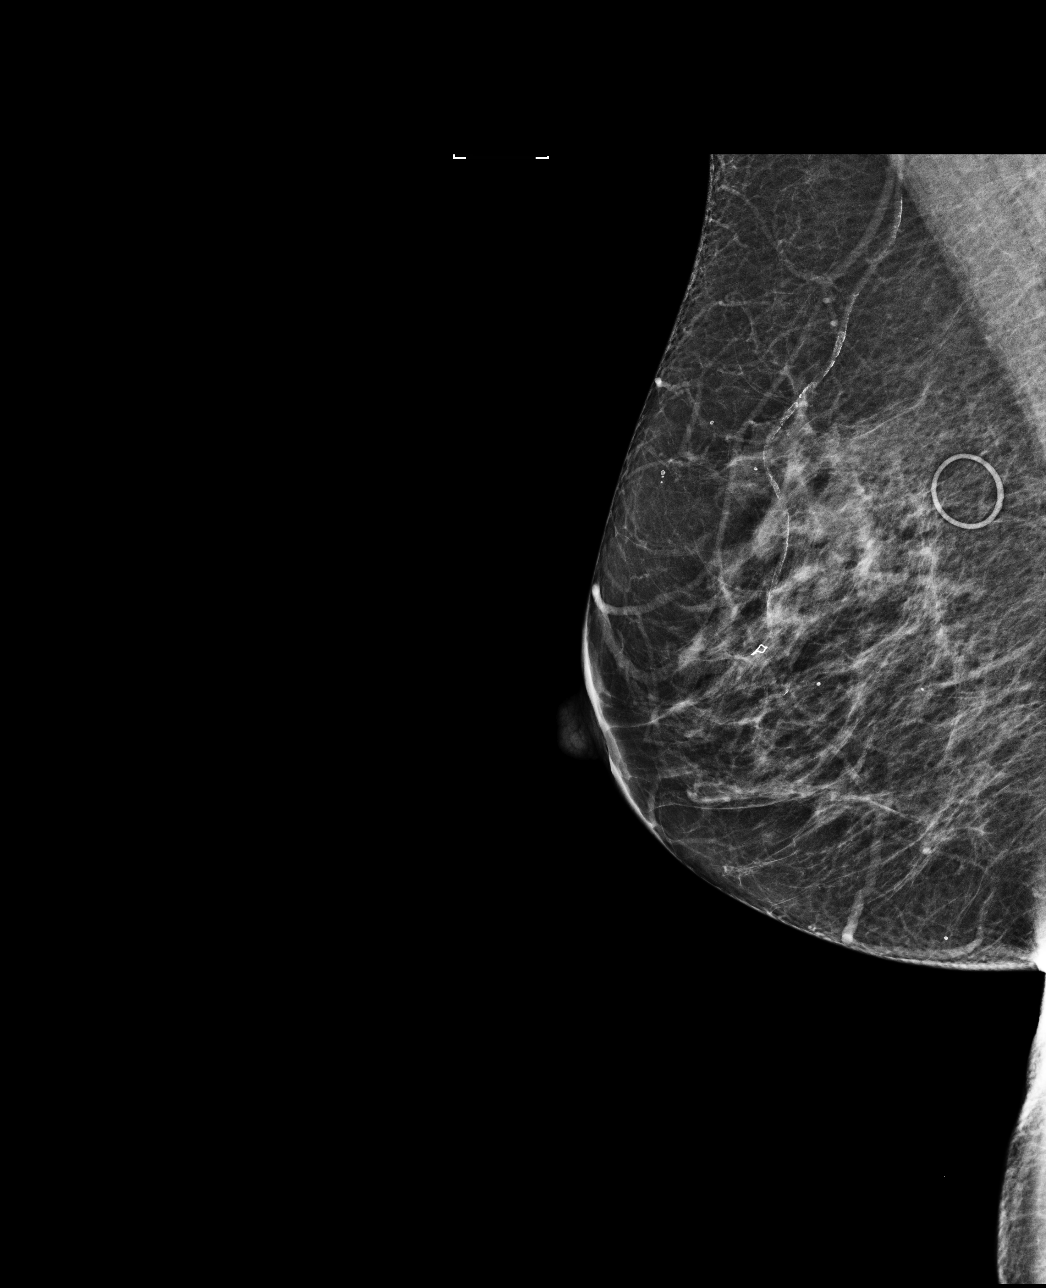

[4 of 4 positions shown; findings below may reference images not displayed]

ACR Breast Density Category c: The breast tissue is heterogeneously
dense, which may obscure small masses.
FINDINGS: There are no findings suspicious for malignancy. Images were
processed with CAD.
IMPRESSION: No mammographic evidence of malignancy. A result letter of this
screening mammogram will be mailed directly to the patient.

RECOMMENDATION:
Screening mammogram in one year. (Code:[0J])

BI-RADS CATEGORY  1: Negative.

## 2017-08-25 ENCOUNTER — Other Ambulatory Visit: Payer: Self-pay | Admitting: *Deleted

## 2017-08-25 ENCOUNTER — Inpatient Hospital Stay
Admission: RE | Admit: 2017-08-25 | Discharge: 2017-08-25 | Disposition: A | Discharge status: Home / Self Care | Payer: Self-pay | Source: Ambulatory Visit | Attending: *Deleted | Admitting: *Deleted

## 2017-08-25 DIAGNOSIS — Z9289 Personal history of other medical treatment: Secondary | ICD-10-CM

## 2018-07-23 ENCOUNTER — Other Ambulatory Visit: Payer: Self-pay | Admitting: Family Medicine

## 2018-07-23 DIAGNOSIS — Z1231 Encounter for screening mammogram for malignant neoplasm of breast: Secondary | ICD-10-CM

## 2018-07-24 ENCOUNTER — Other Ambulatory Visit (INDEPENDENT_AMBULATORY_CARE_PROVIDER_SITE_OTHER): Payer: Medicare Other

## 2018-07-24 ENCOUNTER — Other Ambulatory Visit (INDEPENDENT_AMBULATORY_CARE_PROVIDER_SITE_OTHER): Payer: Self-pay | Admitting: Physician Assistant

## 2018-07-24 ENCOUNTER — Other Ambulatory Visit: Payer: Self-pay

## 2018-07-24 DIAGNOSIS — I824Y2 Acute embolism and thrombosis of unspecified deep veins of left proximal lower extremity: Secondary | ICD-10-CM

## 2018-07-24 DIAGNOSIS — M660 Rupture of popliteal cyst: Secondary | ICD-10-CM | POA: Diagnosis not present

## 2018-07-25 ENCOUNTER — Other Ambulatory Visit: Payer: Self-pay | Admitting: Physician Assistant

## 2018-07-25 ENCOUNTER — Other Ambulatory Visit: Payer: Self-pay | Admitting: Internal Medicine

## 2018-07-25 DIAGNOSIS — M79662 Pain in left lower leg: Secondary | ICD-10-CM

## 2018-07-25 DIAGNOSIS — I824Y2 Acute embolism and thrombosis of unspecified deep veins of left proximal lower extremity: Secondary | ICD-10-CM

## 2018-07-25 DIAGNOSIS — M25562 Pain in left knee: Secondary | ICD-10-CM

## 2018-08-02 ENCOUNTER — Ambulatory Visit: Admission: RE | Admit: 2018-08-02 | Payer: Medicare Other | Source: Ambulatory Visit

## 2018-08-07 ENCOUNTER — Other Ambulatory Visit: Payer: Medicare Other

## 2018-08-20 ENCOUNTER — Ambulatory Visit: Payer: Medicare Other

## 2018-09-04 ENCOUNTER — Ambulatory Visit: Payer: Medicare Other

## 2018-09-05 ENCOUNTER — Other Ambulatory Visit: Payer: Self-pay

## 2018-09-05 ENCOUNTER — Ambulatory Visit
Admission: RE | Admit: 2018-09-05 | Discharge: 2018-09-05 | Disposition: A | Discharge status: Home / Self Care | Payer: Medicare Other | Source: Ambulatory Visit | Attending: Physician Assistant | Admitting: Physician Assistant

## 2018-09-05 DIAGNOSIS — M25562 Pain in left knee: Secondary | ICD-10-CM | POA: Insufficient documentation

## 2018-09-05 IMAGING — MR MRI OF THE LEFT KNEE WITHOUT CONTRAST
6 series · 40 of 40 positions shown · non-contrast
Comparison: None.

CLINICAL DATA: Acute left knee pain for 6 weeks.

EXAM:
MRI OF THE LEFT KNEE WITHOUT CONTRAST
TECHNIQUE: Multiplanar, multisequence MR imaging of the knee was performed. No
intravenous contrast was administered.

[Series 8: T2 fat-sat · axial · left · 4.0mm · 0.50mm/px · z∈[-99,+34]mm · 5 of 28 slices shown (1 of 3)]
[im 1/28]
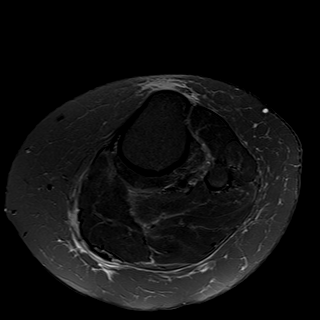
[im 7/28]
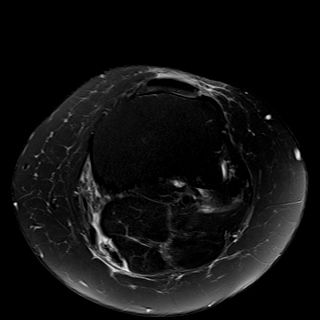
[im 14/28]
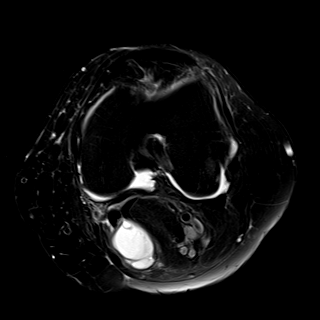
[im 21/28]
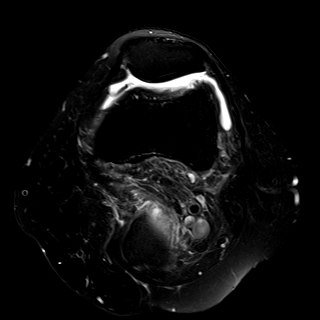
[im 28/28]
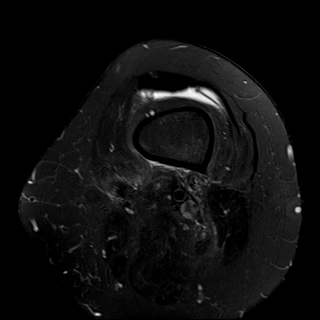

[Series 9: T1 · coronal · left · 4.0mm · 0.59mm/px · 7 of 30 slices shown]
[im 1/30]
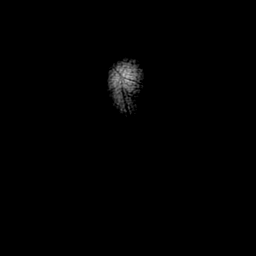
[im 5/30]
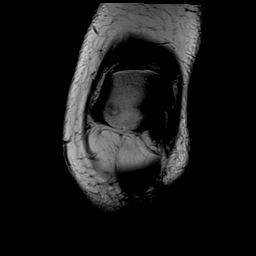
[im 10/30]
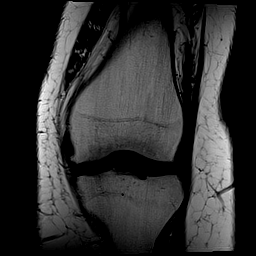
[im 15/30]
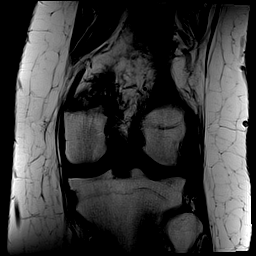
[im 20/30]
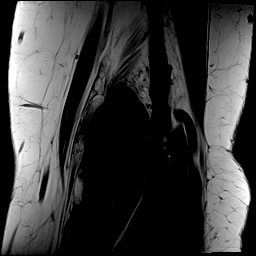
[im 25/30]
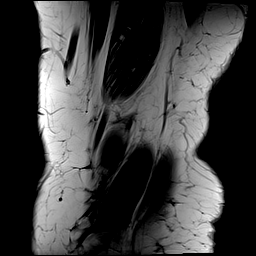
[im 30/30]
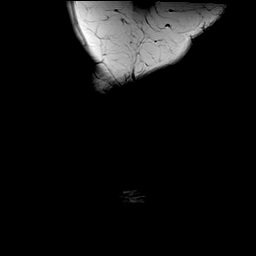

[Series 10: T2 fat-sat · coronal · left · 4.0mm · 0.59mm/px · 7 of 30 slices shown (2 of 3)]
[im 1/30]
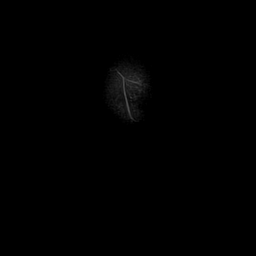
[im 5/30]
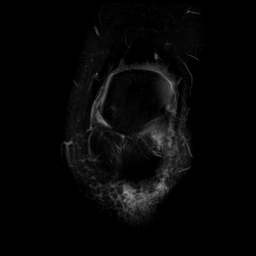
[im 10/30]
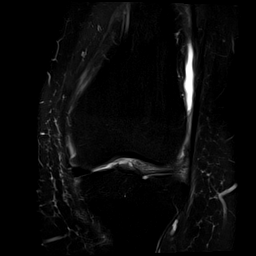
[im 15/30]
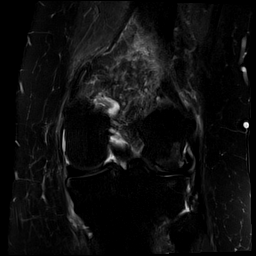
[im 20/30]
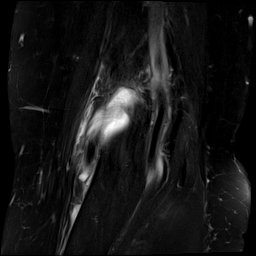
[im 25/30]
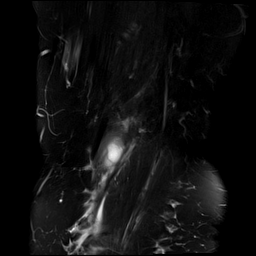
[im 30/30]
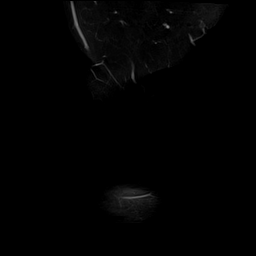

[Series 11: PD fat-sat · coronal · left · 4.0mm · 0.59mm/px · 7 of 30 slices shown (1 of 2)]
[im 1/30]
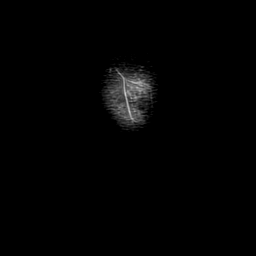
[im 5/30]
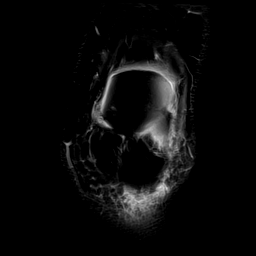
[im 10/30]
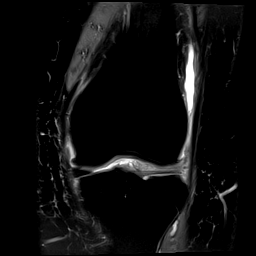
[im 15/30]
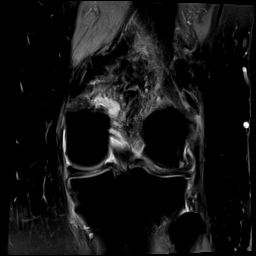
[im 20/30]
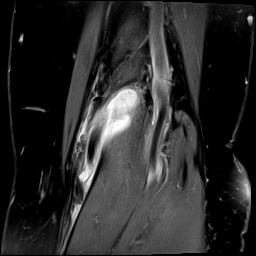
[im 25/30]
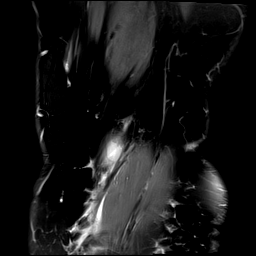
[im 30/30]
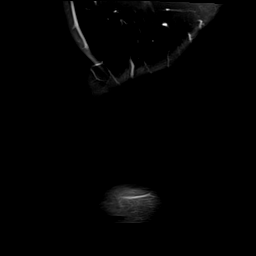

[Series 12: PD fat-sat · sagittal · left · 3.0mm · 0.59mm/px · 6 of 28 slices shown (2 of 2)]
[im 1/28]
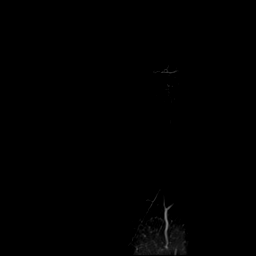
[im 6/28]
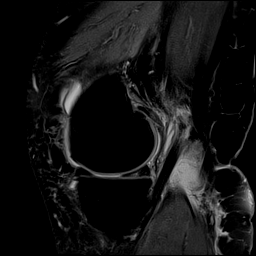
[im 11/28]
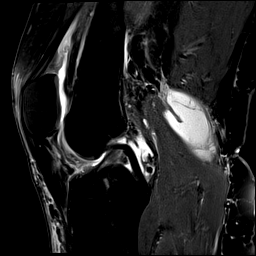
[im 17/28]
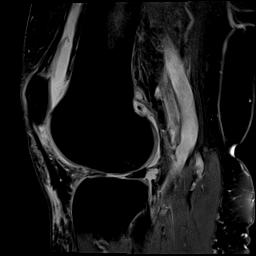
[im 22/28]
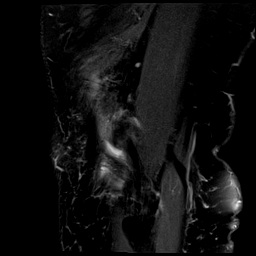
[im 28/28]
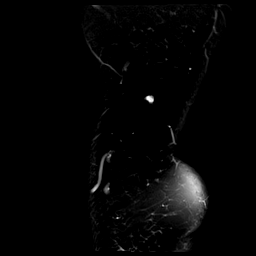

[Series 13: T2 fat-sat · sagittal · left · 3.0mm · 0.59mm/px · 8 of 36 slices shown (3 of 3)]
[im 1/36]
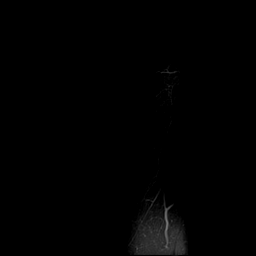
[im 6/36]
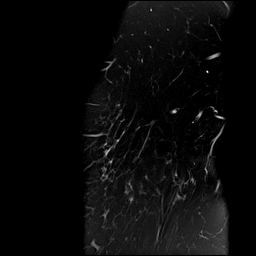
[im 11/36]
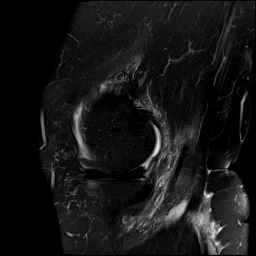
[im 16/36]
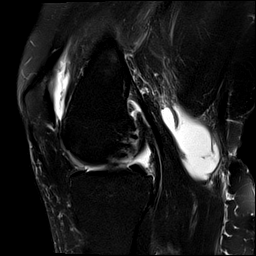
[im 21/36]
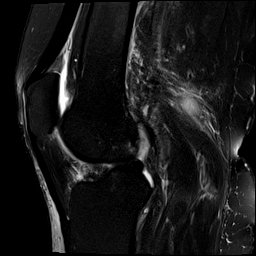
[im 26/36]
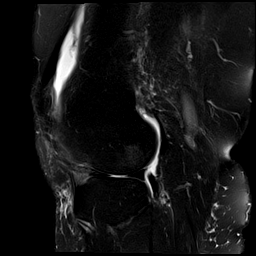
[im 31/36]
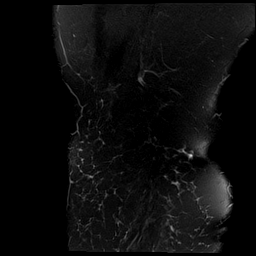
[im 36/36]
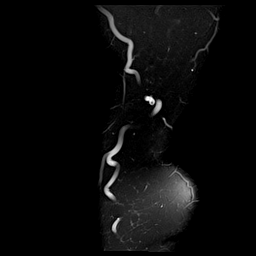

[40 of 40 positions shown; findings below may reference images not displayed]

FINDINGS: MENISCI

Medial meniscus: There is a small focal undersurface tear of the
posterior horn, best seen on images 13 and 14 of series 12.

Lateral meniscus: There is extensive degeneration of the anterior
horn. There is a horizontal tear of the midbody with displacement of
a portion of the meniscus into the superior gutter best seen on
image 18 of series 11. The lateral meniscus is peripherally subluxed
due to joint space narrowing.

LIGAMENTS

Cruciates:  Intact.  Slight degeneration of the ACL.

Collaterals:  Normal.

CARTILAGE

Patellofemoral: Slight thinning of the articular cartilage of
trochlear groove of the distal femur with small focal areas of
thinning of the articular cartilage of the medial and lateral facets
of the patella.

Medial:  Intact.

Lateral: Full-thickness cartilage loss in the central portion of the
femoral condyle and tibial plateau.

Joint: Moderate joint effusion. Thickened medial, lateral, and
suprapatellar plicae. Normal Hoffa's fat pad. 7 mm loose body in the
posterior aspect of the joint posterior to the posterior cruciate
ligament.

Popliteal Fossa: 6 x 2 x 3 cm leaking Baker's cyst. Intact popliteus
tendon.

Extensor Mechanism:  Normal.

Bones: Small marginal osteophytes in the medial and lateral
compartments.

Other: None
IMPRESSION: 1. Horizontal tear of the midbody of the lateral meniscus with a
fragment displaced into the superior gutter with degeneration of the
anterior horn.
2. Small focal undersurface tear of the posterior horn of the medial
meniscus.
3. A extensive cartilage loss in the lateral compartment with a
calcified loose body in the posterior aspect of the joint.
4. Leaking Baker's cyst.
5. Moderate joint effusion.
6. Multiple plicae.

## 2018-10-08 ENCOUNTER — Ambulatory Visit
Admission: RE | Admit: 2018-10-08 | Discharge: 2018-10-08 | Disposition: A | Discharge status: Home / Self Care | Payer: Medicare Other | Source: Ambulatory Visit | Attending: Family Medicine | Admitting: Family Medicine

## 2018-10-08 ENCOUNTER — Other Ambulatory Visit: Payer: Self-pay

## 2018-10-08 DIAGNOSIS — Z1231 Encounter for screening mammogram for malignant neoplasm of breast: Secondary | ICD-10-CM | POA: Diagnosis present

## 2018-10-08 IMAGING — MG DIGITAL SCREENING BILATERAL MAMMOGRAM WITH TOMO AND CAD
8 series · 8 of 24 positions shown · non-contrast
Comparison: Previous exam(s).

CLINICAL DATA: Screening.

EXAM:
DIGITAL SCREENING BILATERAL MAMMOGRAM WITH TOMO AND CAD

[R CC synth-2D]
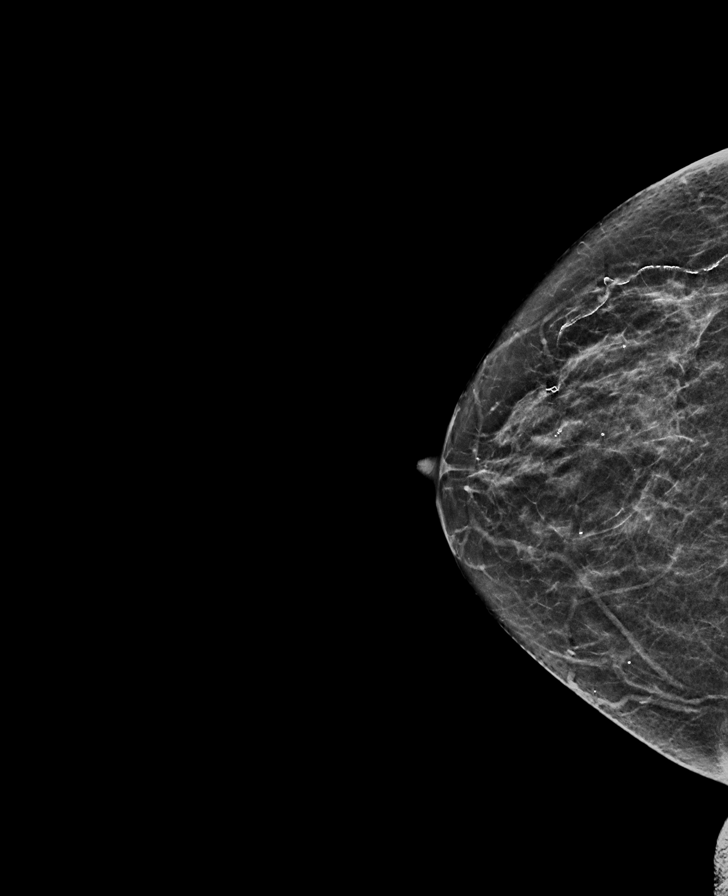

[L CC synth-2D]
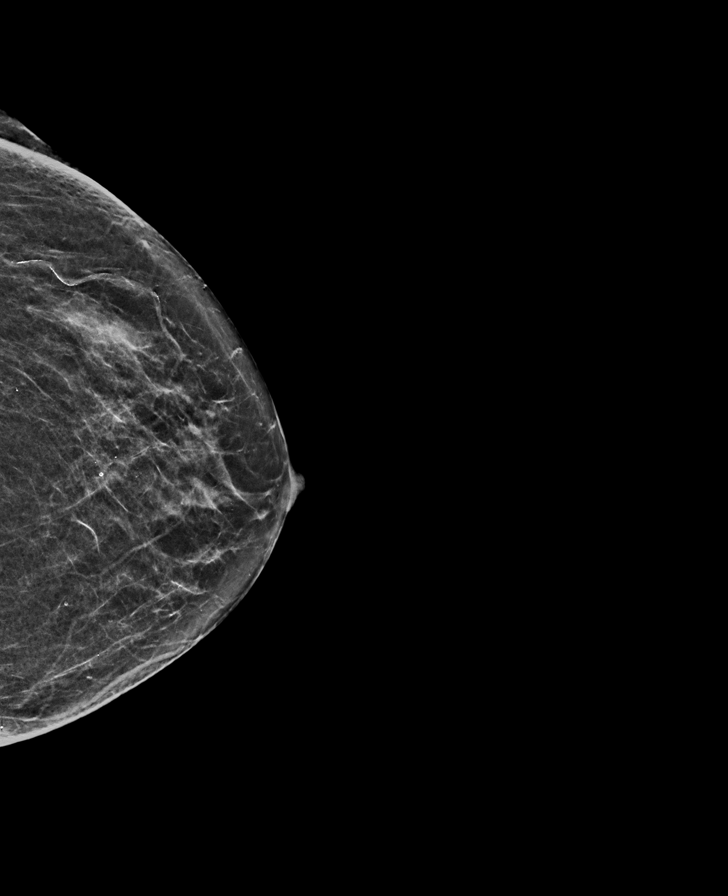

[L MLO synth-2D]
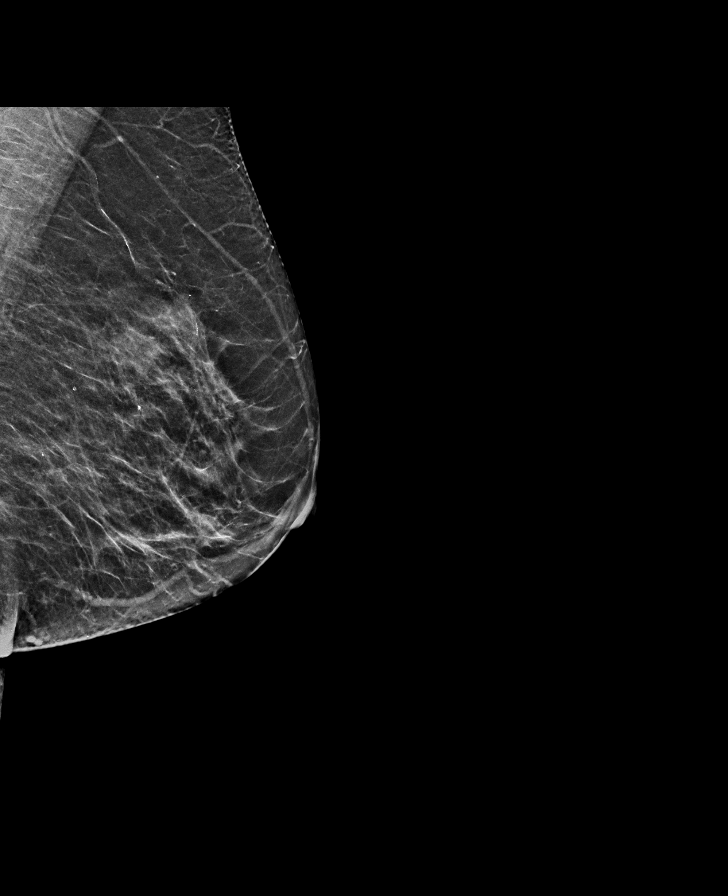

[R MLO synth-2D]
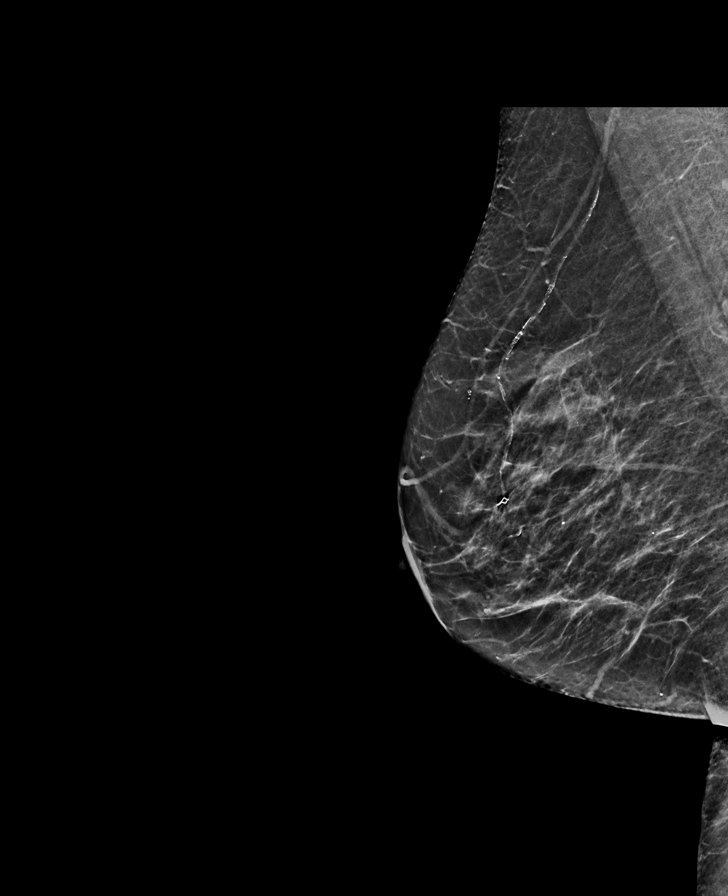

[L MLO tomo · tomo slice 27/54.0]
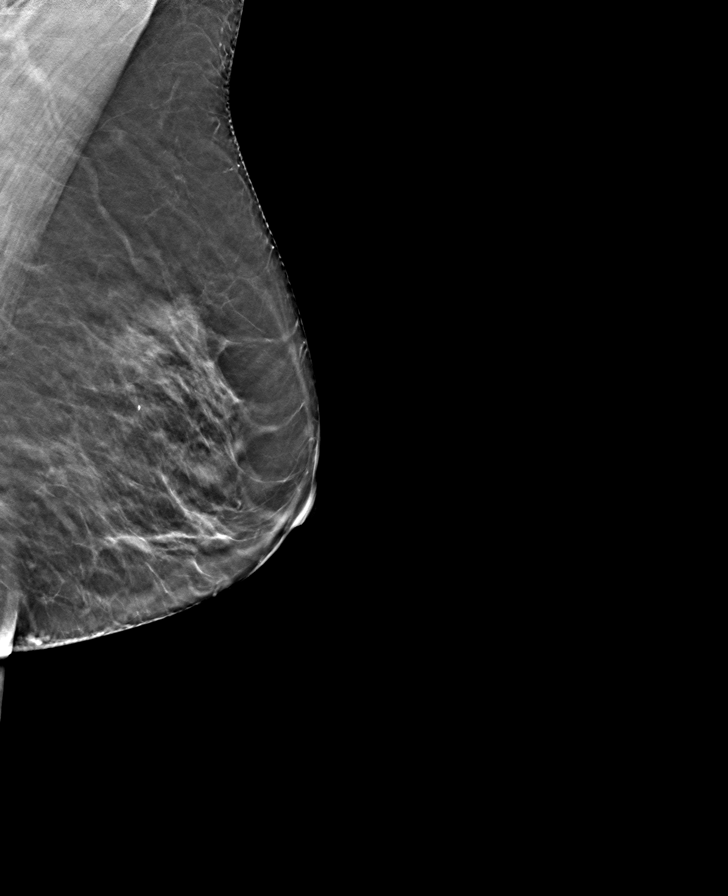

[L CC tomo · tomo slice 27/53.0]
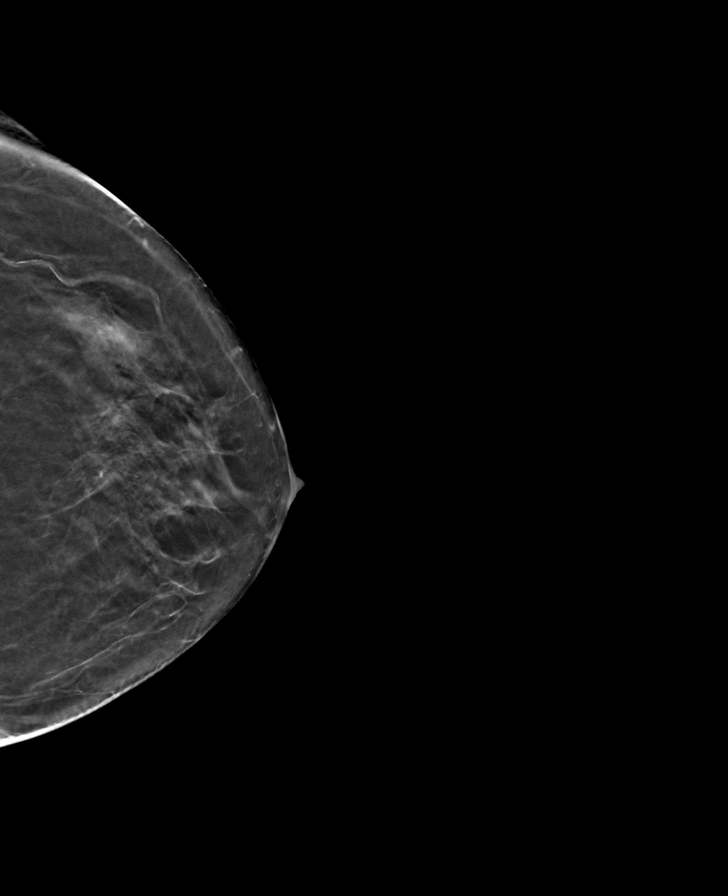

[R MLO tomo · tomo slice 29/56.0]
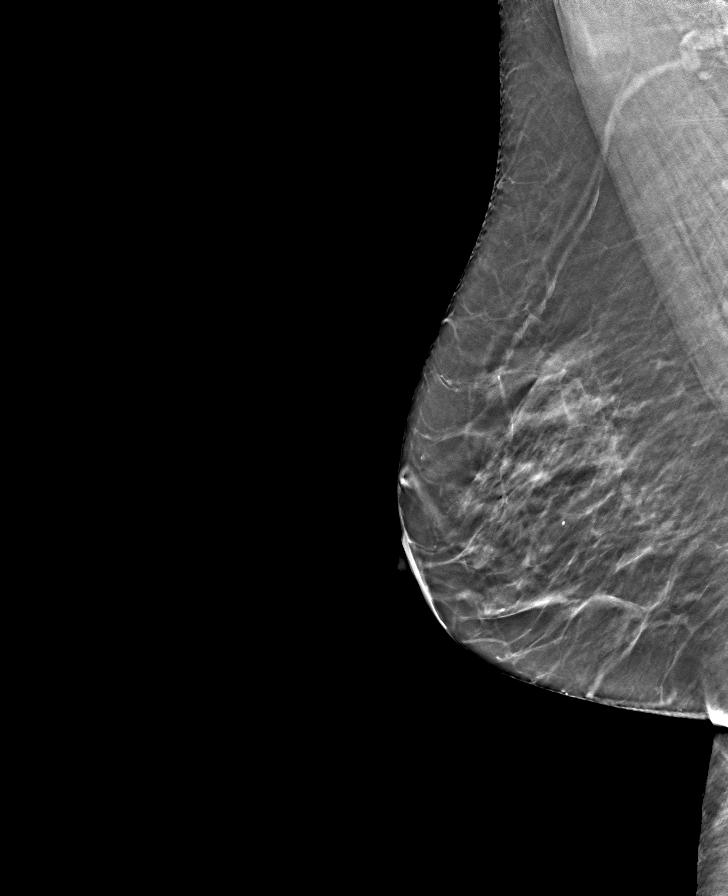

[R CC tomo · tomo slice 25/50.0]
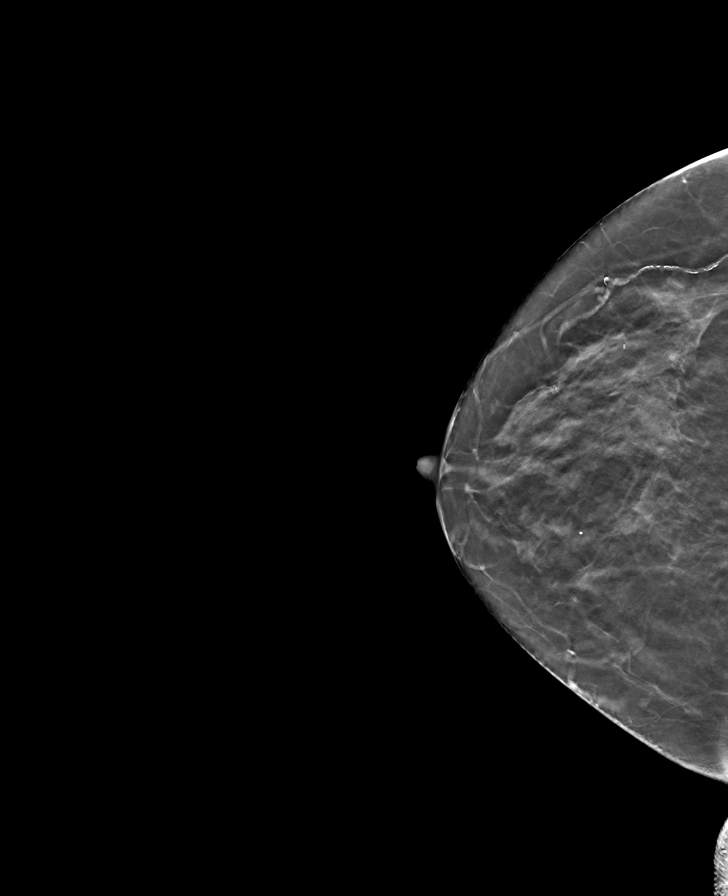

[8 of 24 positions shown; findings below may reference images not displayed]

ACR Breast Density Category b: There are scattered areas of
fibroglandular density.
FINDINGS: There are no findings suspicious for malignancy. Images were
processed with CAD.
IMPRESSION: No mammographic evidence of malignancy. A result letter of this
screening mammogram will be mailed directly to the patient.

RECOMMENDATION:
Screening mammogram in one year. (Code:[TQ])

BI-RADS CATEGORY  1: Negative.

## 2018-10-25 ENCOUNTER — Ambulatory Visit
Admission: RE | Admit: 2018-10-25 | Discharge: 2018-10-25 | Disposition: A | Discharge status: Home / Self Care | Payer: Medicare Other | Attending: Family Medicine | Admitting: Family Medicine

## 2018-10-25 ENCOUNTER — Ambulatory Visit
Admission: RE | Admit: 2018-10-25 | Discharge: 2018-10-25 | Disposition: A | Discharge status: Home / Self Care | Payer: Medicare Other | Source: Ambulatory Visit | Attending: Family Medicine | Admitting: Family Medicine

## 2018-10-25 ENCOUNTER — Other Ambulatory Visit: Payer: Self-pay

## 2018-10-25 ENCOUNTER — Other Ambulatory Visit: Payer: Self-pay | Admitting: Family Medicine

## 2018-10-25 DIAGNOSIS — W19XXXA Unspecified fall, initial encounter: Secondary | ICD-10-CM | POA: Insufficient documentation

## 2018-10-25 DIAGNOSIS — M533 Sacrococcygeal disorders, not elsewhere classified: Secondary | ICD-10-CM

## 2018-10-25 IMAGING — CR SACRUM AND COCCYX - 2+ VIEW
3 series · 4 of 4 positions shown · non-contrast
Comparison: None.

CLINICAL DATA: Pt fell on [REDACTED] injuring sacral/tail bone area
trying to SHANKS. Fell backwards. Has large bruise/
hematomas like area over left side of sacral area and most pain
lower sacral just above distal coccyx

EXAM:
SACRUM AND COCCYX - 2+ VIEW

[sacrum ap]
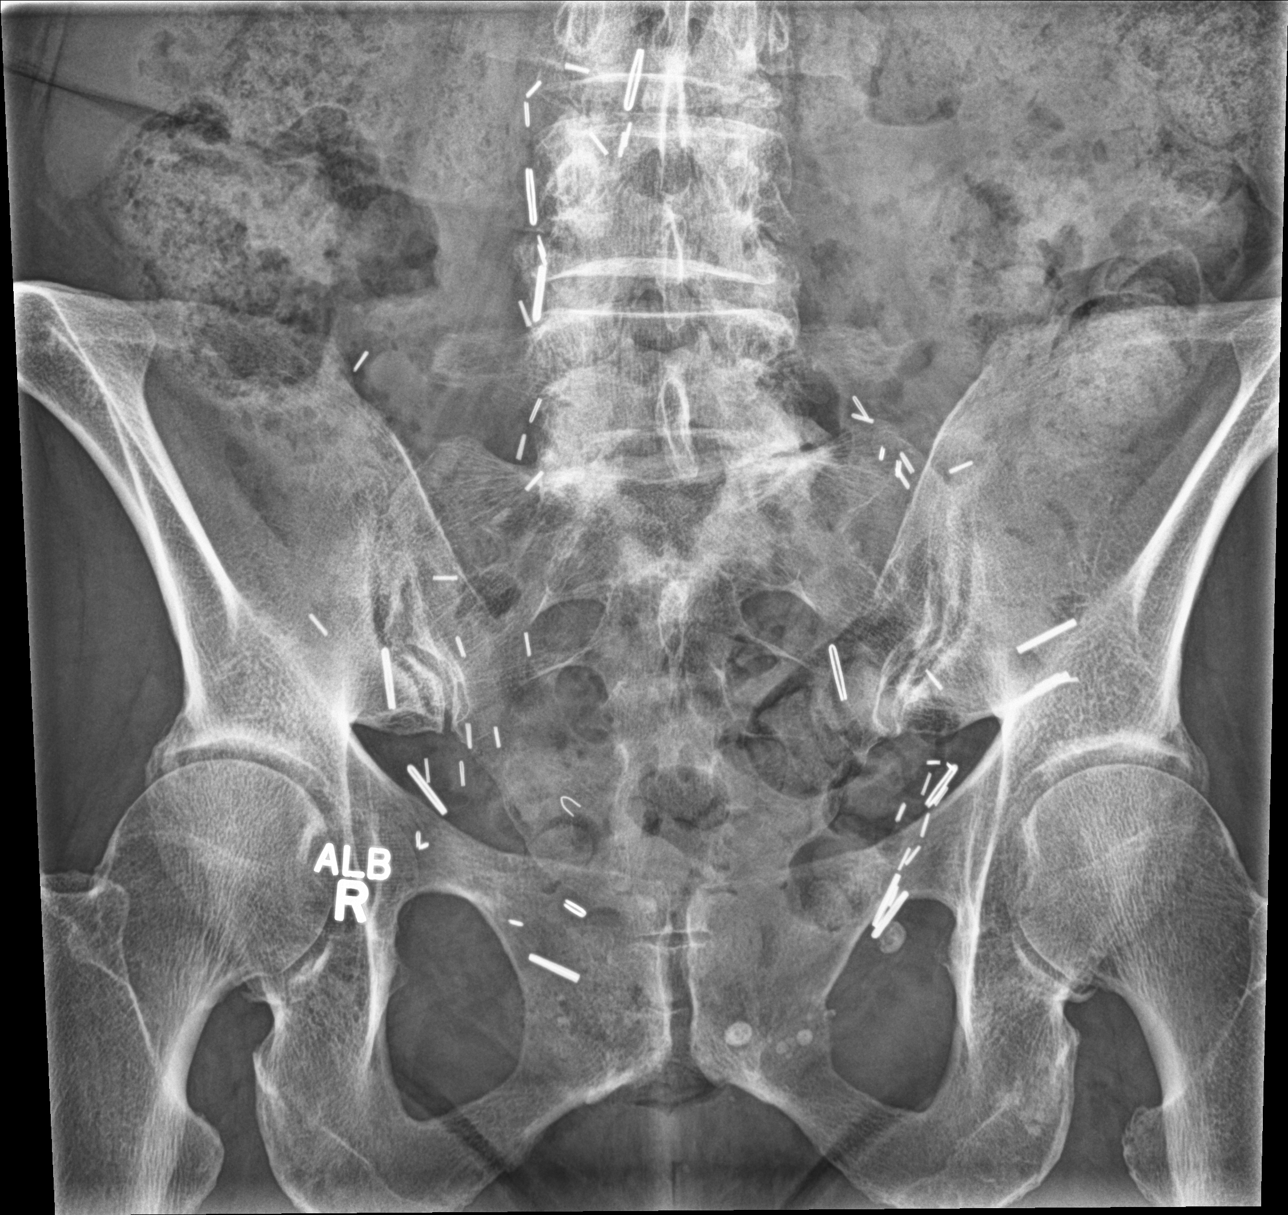

[coccyx ap]
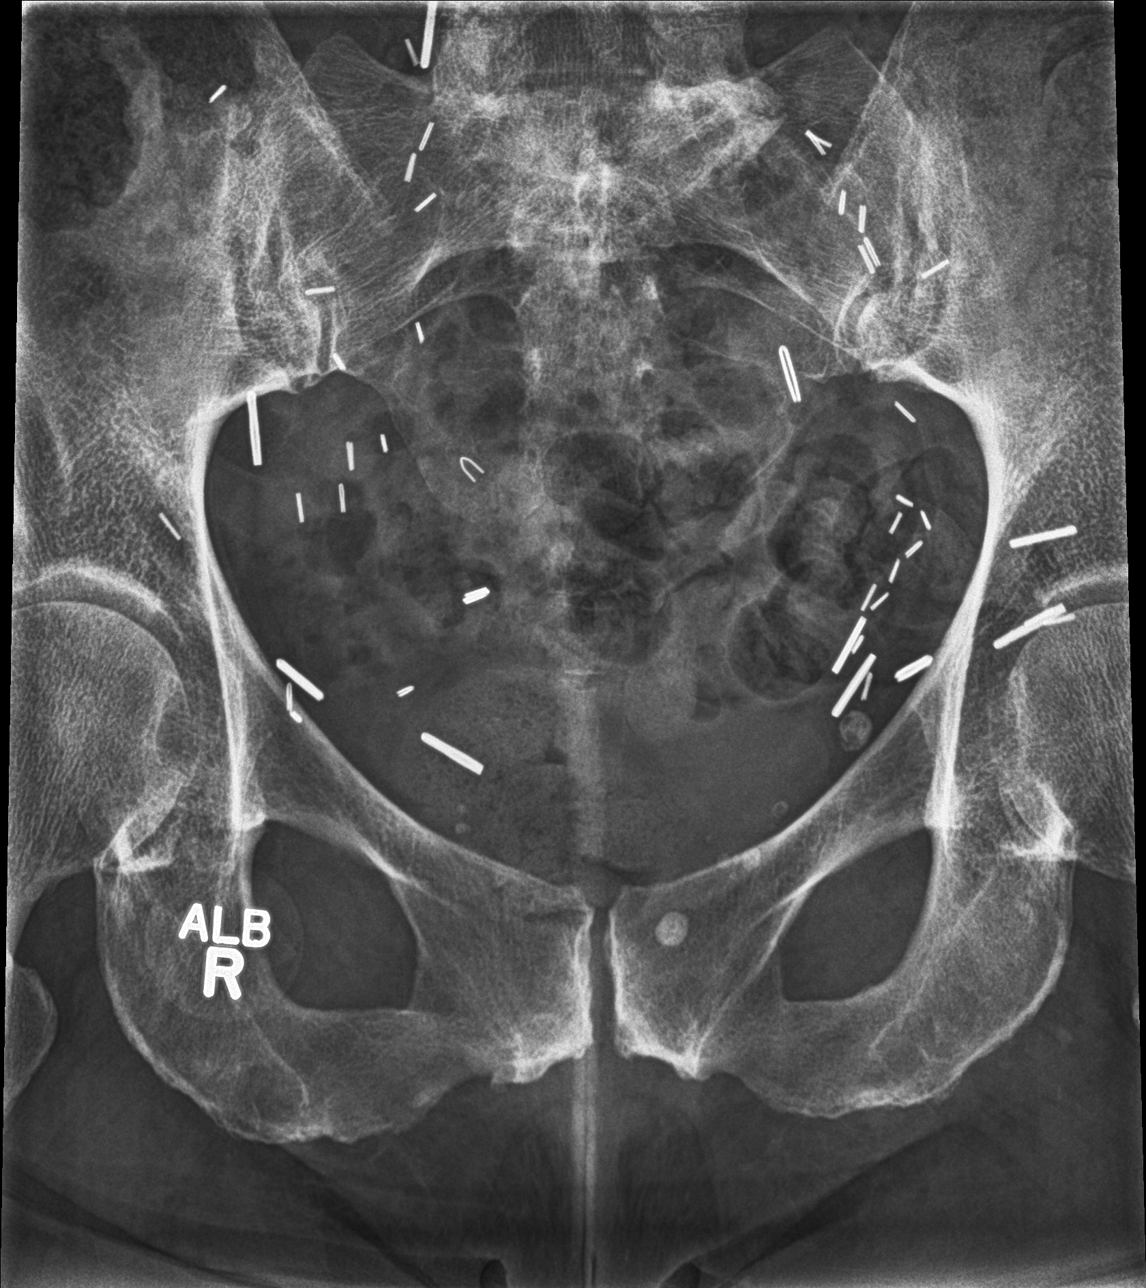

[Series 4: sacrum lat · 0.14mm/px · 2 of 2 slices shown]
[im 1/2]
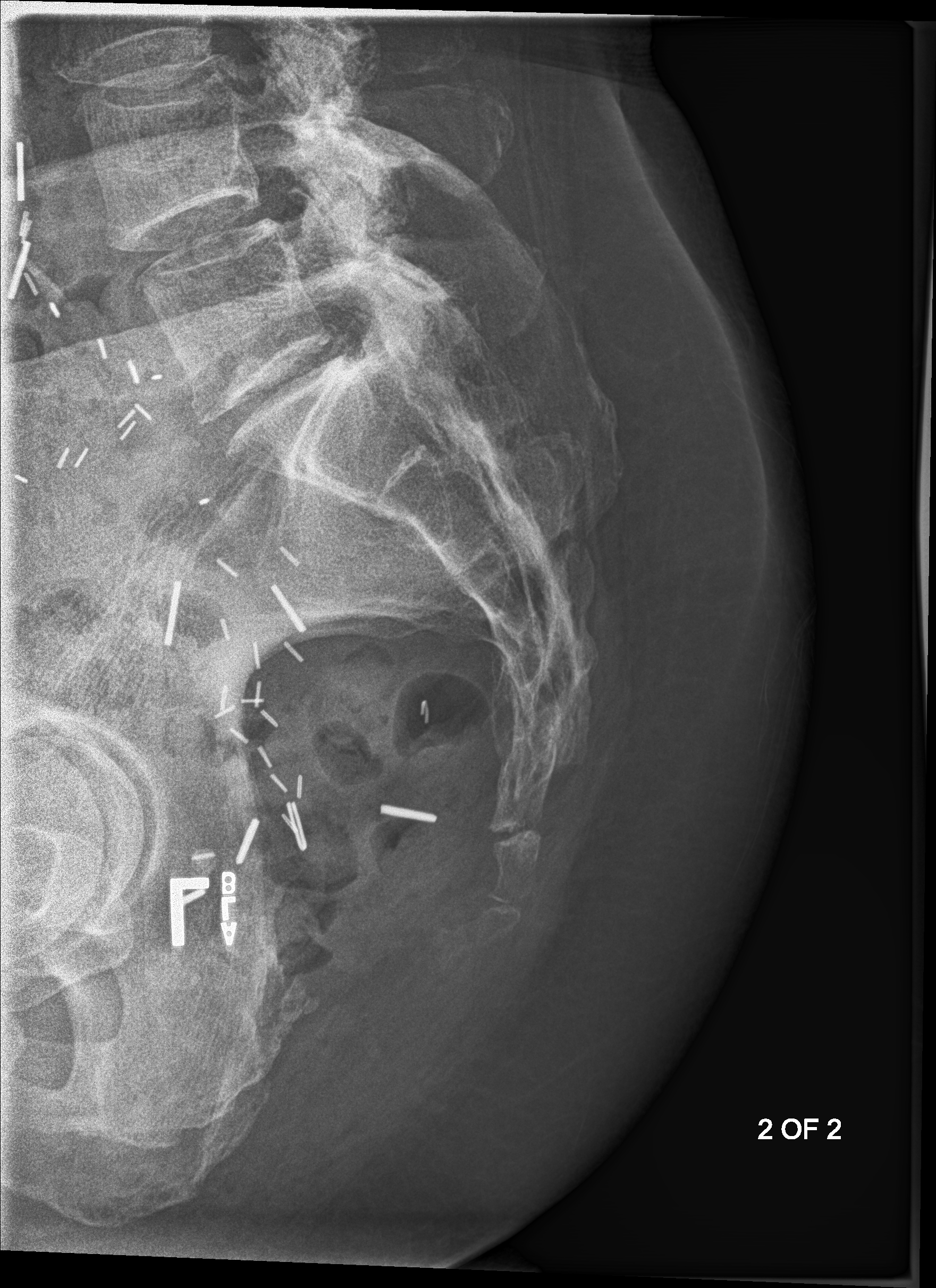
[im 2/2]
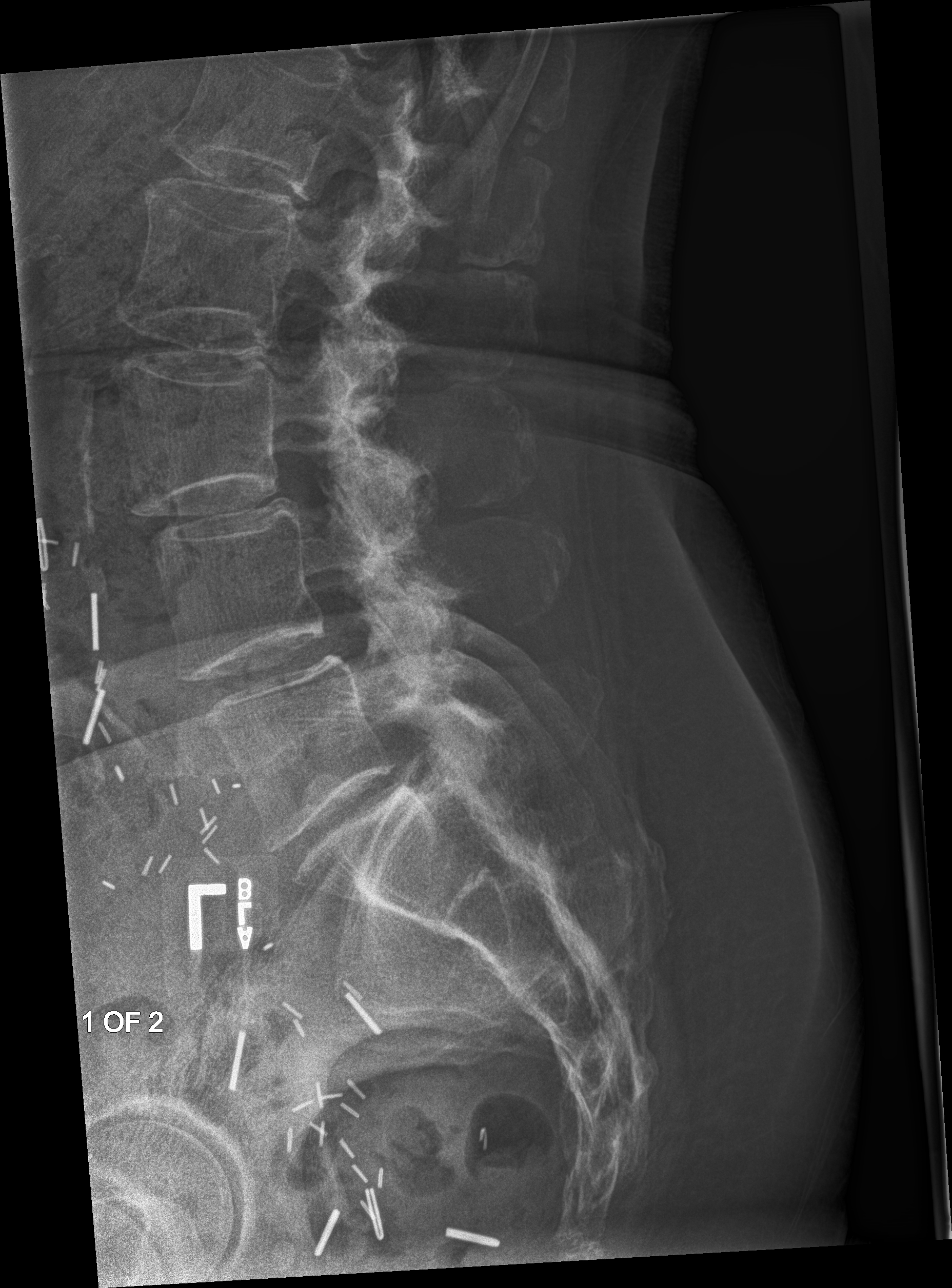

[4 of 4 positions shown; findings below may reference images not displayed]

FINDINGS: No fracture or bone lesion.

SI joints are normally spaced and aligned.

There are numerous surgical vascular clips in the pelvis and lower
abdomen. Soft tissues otherwise unremarkable.
IMPRESSION: No fracture, dislocation or acute finding

## 2019-09-13 ENCOUNTER — Other Ambulatory Visit: Payer: Self-pay | Admitting: Family Medicine

## 2019-09-13 DIAGNOSIS — Z1231 Encounter for screening mammogram for malignant neoplasm of breast: Secondary | ICD-10-CM

## 2019-10-15 ENCOUNTER — Other Ambulatory Visit: Payer: Self-pay

## 2019-10-15 ENCOUNTER — Ambulatory Visit
Admission: RE | Admit: 2019-10-15 | Discharge: 2019-10-15 | Disposition: A | Discharge status: Home / Self Care | Payer: Medicare Other | Source: Ambulatory Visit | Attending: Family Medicine | Admitting: Family Medicine

## 2019-10-15 DIAGNOSIS — Z1231 Encounter for screening mammogram for malignant neoplasm of breast: Secondary | ICD-10-CM | POA: Insufficient documentation

## 2019-10-15 IMAGING — MG DIGITAL SCREENING BILAT W/ TOMO W/ CAD
8 series · 8 of 24 positions shown · non-contrast
Comparison: Previous exam(s).

CLINICAL DATA: Screening.

EXAM:
DIGITAL SCREENING BILATERAL MAMMOGRAM WITH TOMO AND CAD

[L CC synth-2D]
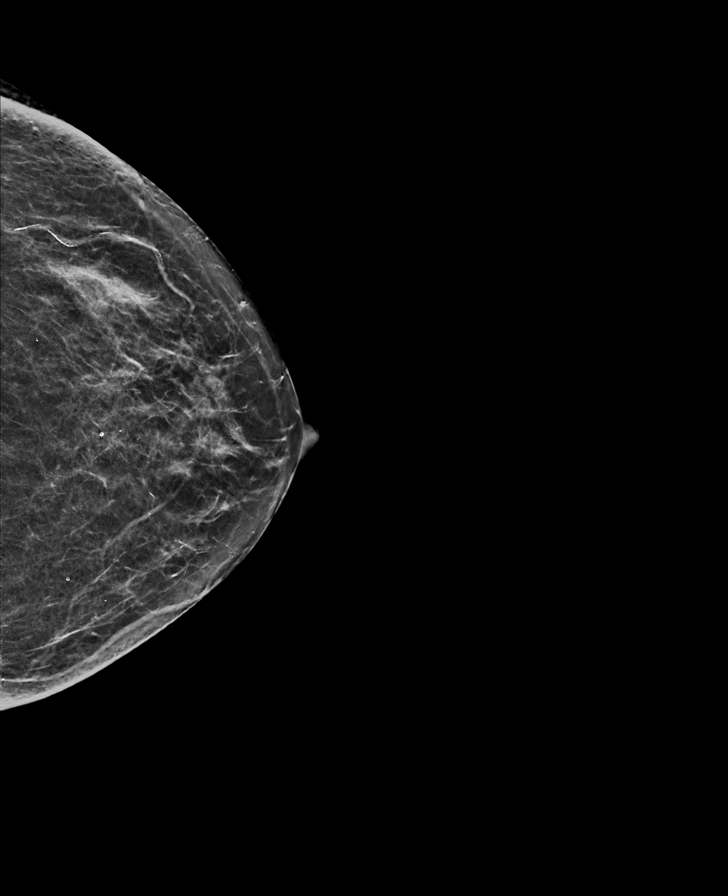

[R MLO synth-2D]
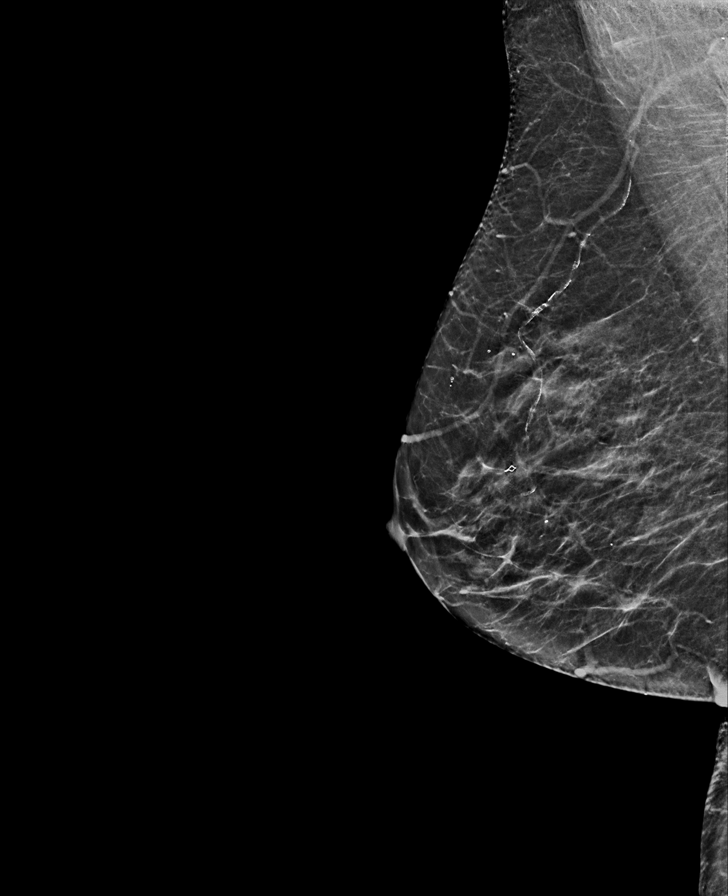

[R CC synth-2D]
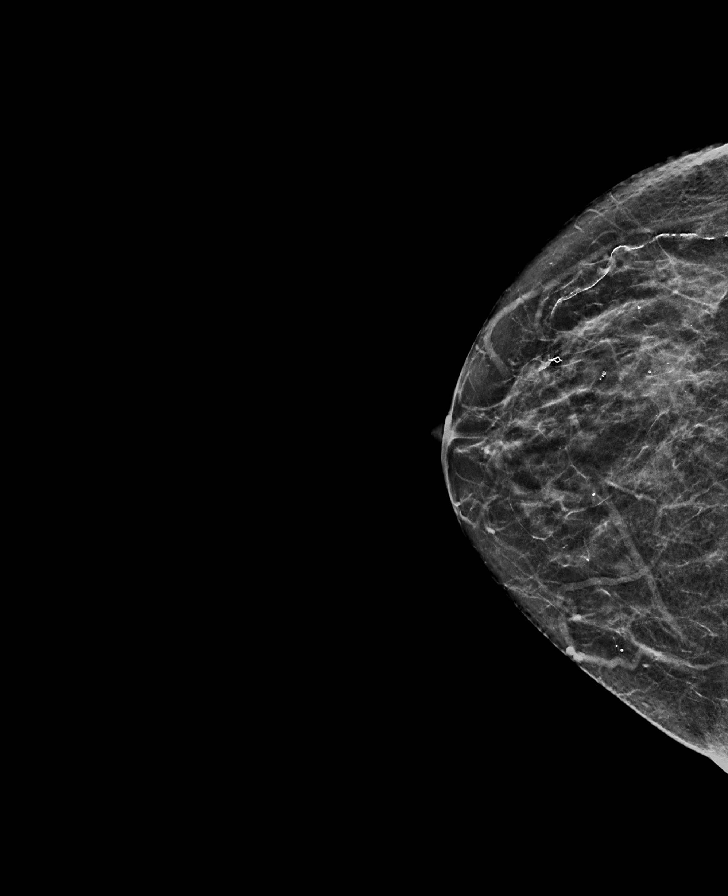

[L MLO synth-2D]
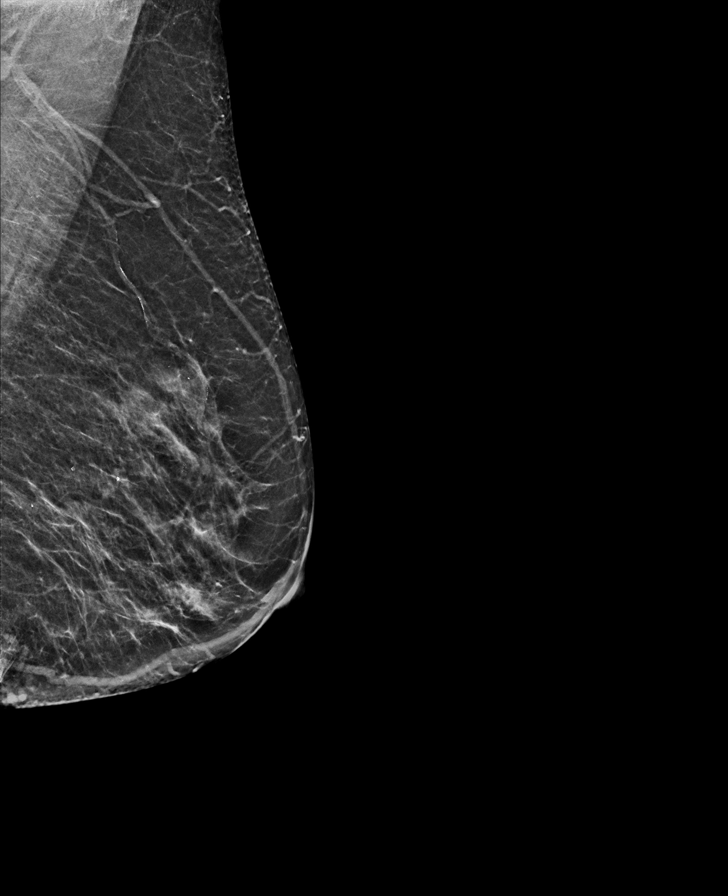

[R MLO tomo · tomo slice 29/57.0]
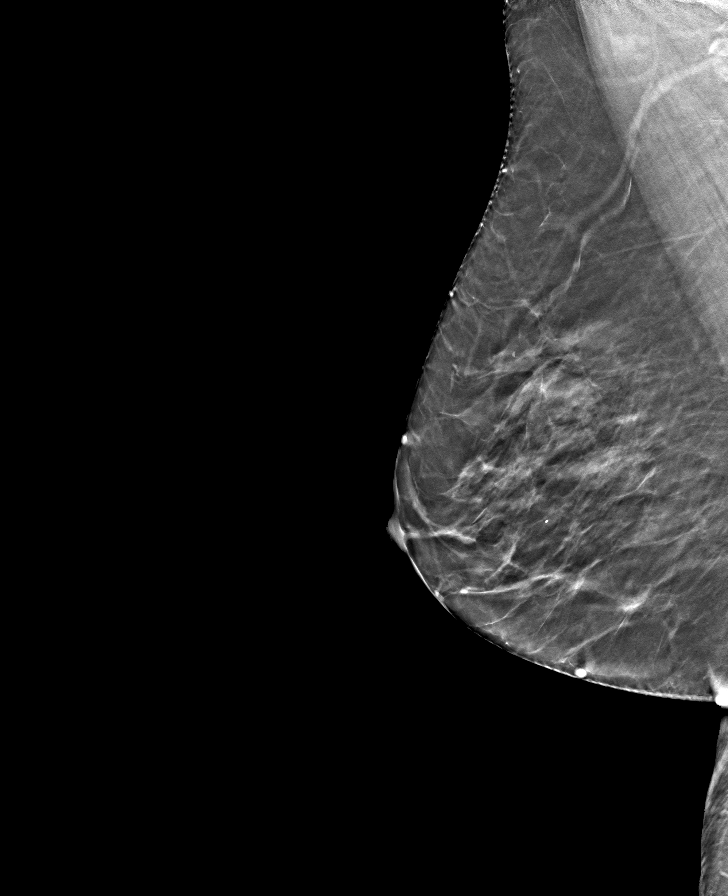

[L MLO tomo · tomo slice 29/56.0]
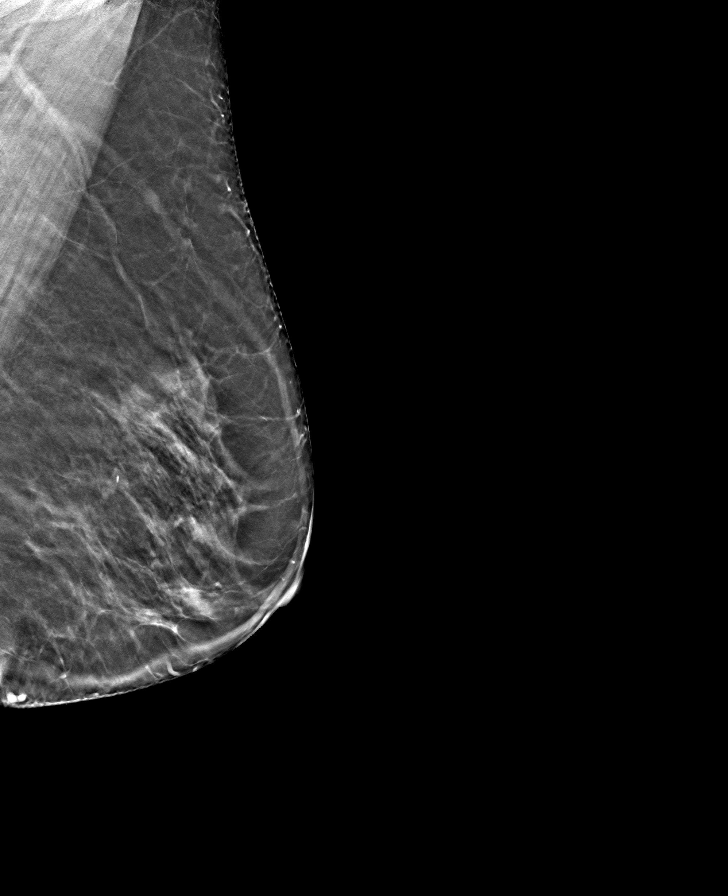

[R CC tomo · tomo slice 27/54.0]
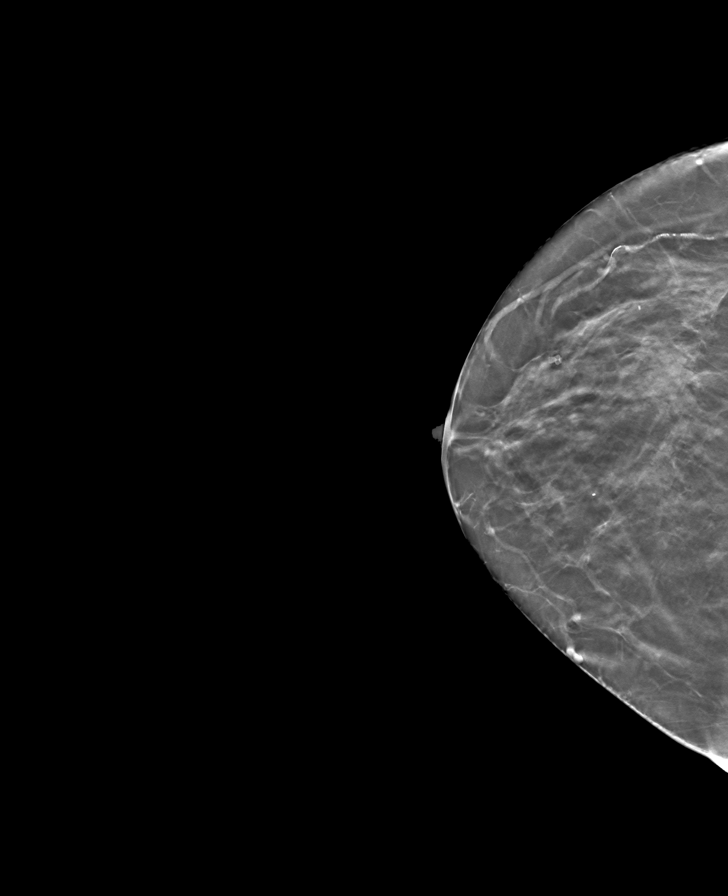

[L CC tomo · tomo slice 28/55.0]
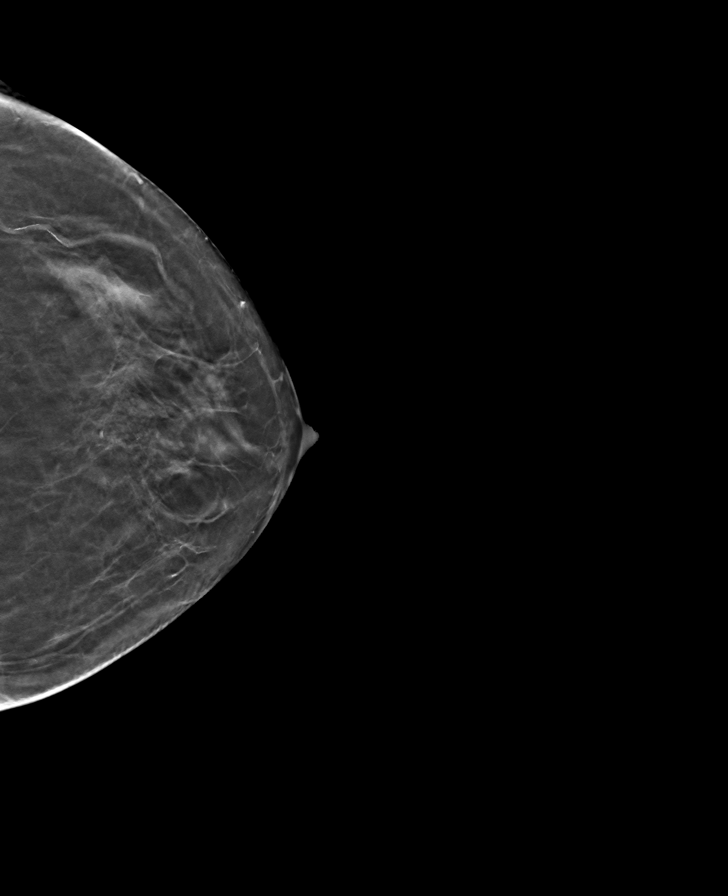

[8 of 24 positions shown; findings below may reference images not displayed]

ACR Breast Density Category b: There are scattered areas of
fibroglandular density.
FINDINGS: There are no findings suspicious for malignancy. Images were
processed with CAD.
IMPRESSION: No mammographic evidence of malignancy. A result letter of this
screening mammogram will be mailed directly to the patient.

RECOMMENDATION:
Screening mammogram in one year. (Code:[TQ])

BI-RADS CATEGORY  1: Negative.

## 2019-10-25 ENCOUNTER — Other Ambulatory Visit: Payer: Self-pay | Admitting: Physician Assistant

## 2019-10-25 DIAGNOSIS — M2391 Unspecified internal derangement of right knee: Secondary | ICD-10-CM

## 2019-11-12 ENCOUNTER — Other Ambulatory Visit: Payer: Self-pay

## 2019-11-12 ENCOUNTER — Ambulatory Visit
Admission: RE | Admit: 2019-11-12 | Discharge: 2019-11-12 | Disposition: A | Discharge status: Home / Self Care | Payer: Medicare Other | Source: Ambulatory Visit | Attending: Physician Assistant | Admitting: Physician Assistant

## 2019-11-12 DIAGNOSIS — M2391 Unspecified internal derangement of right knee: Secondary | ICD-10-CM

## 2019-11-12 IMAGING — MR MR KNEE*R* W/O CM
6 series · 40 of 40 positions shown · non-contrast
Comparison: None.

CLINICAL DATA: Medial knee pain for the last 2-3 months

EXAM:
MRI OF THE RIGHT KNEE WITHOUT CONTRAST
TECHNIQUE: Multiplanar, multisequence MR imaging of the knee was performed. No
intravenous contrast was administered.

[Series 8: T2 fat-sat · axial · right · 4.0mm · 0.50mm/px · z∈[+6,+131]mm · 5 of 26 slices shown (1 of 3)]
[im 1/26]
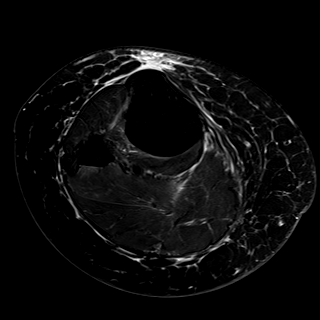
[im 7/26]
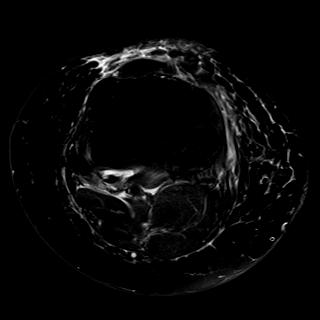
[im 13/26]
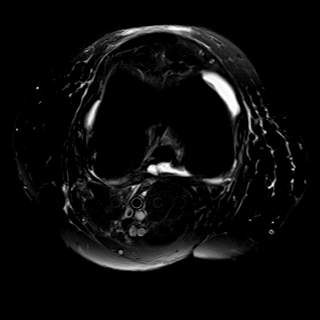
[im 19/26]
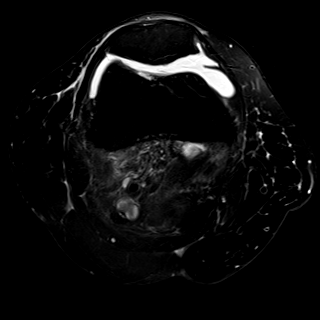
[im 26/26]
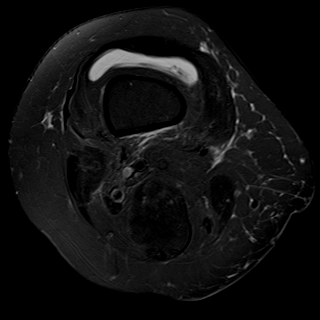

[Series 9: T1 · coronal · right · 4.0mm · 0.39mm/px · 7 of 32 slices shown]
[im 1/32]
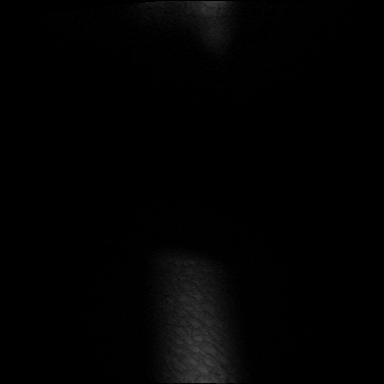
[im 6/32]
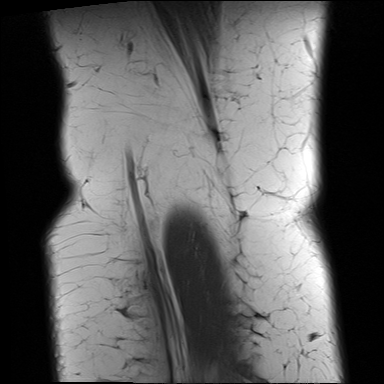
[im 11/32]
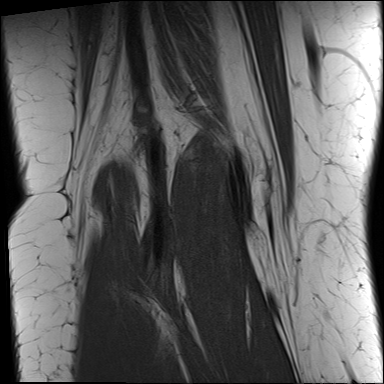
[im 16/32]
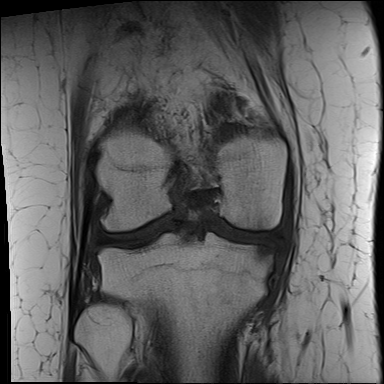
[im 21/32]
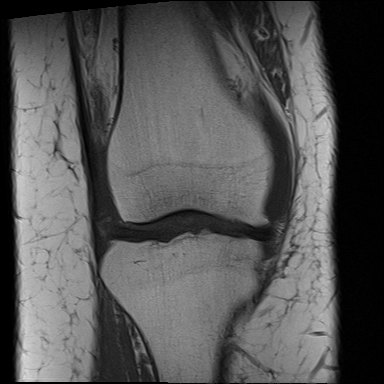
[im 26/32]
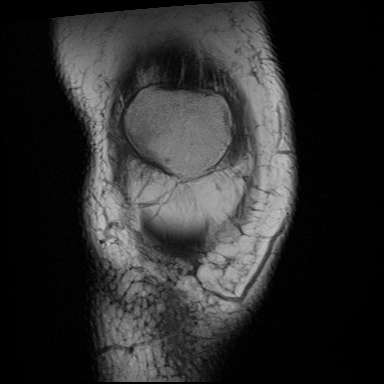
[im 32/32]
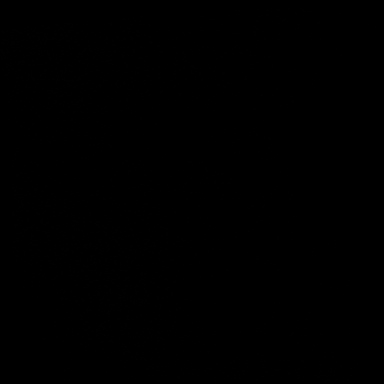

[Series 10: PD fat-sat · sagittal · right · 3.0mm · 0.59mm/px · 6 of 30 slices shown (1 of 2)]
[im 1/30]
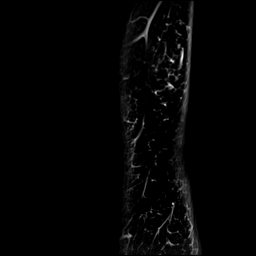
[im 6/30]
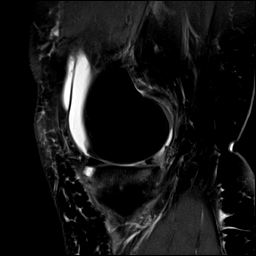
[im 12/30]
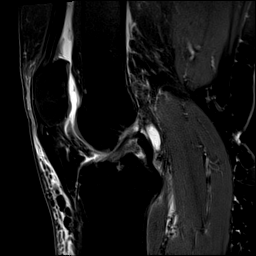
[im 18/30]
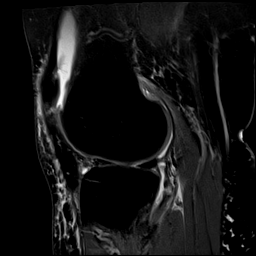
[im 24/30]
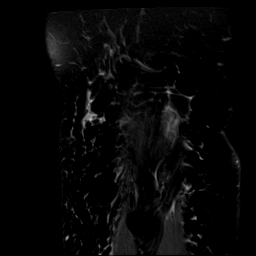
[im 30/30]
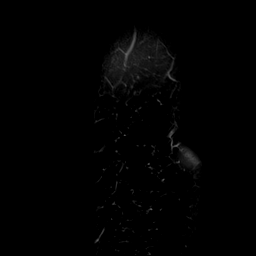

[Series 11: T2 fat-sat · coronal · right · 4.0mm · 0.59mm/px · 7 of 31 slices shown (2 of 3)]
[im 1/31]
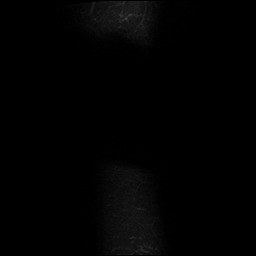
[im 6/31]
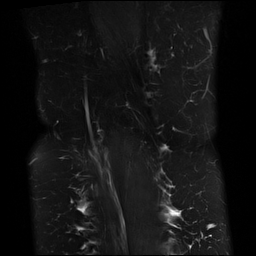
[im 11/31]
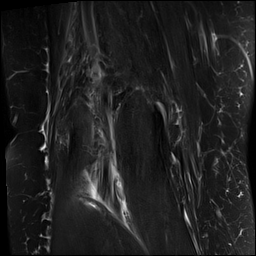
[im 16/31]
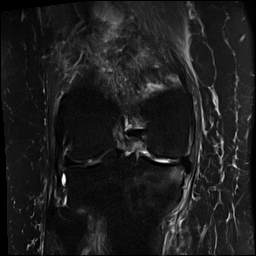
[im 21/31]
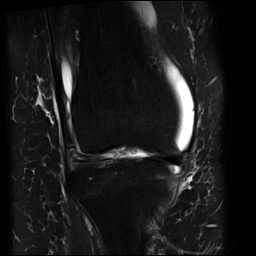
[im 26/31]
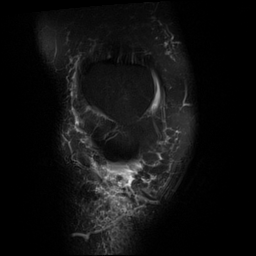
[im 31/31]
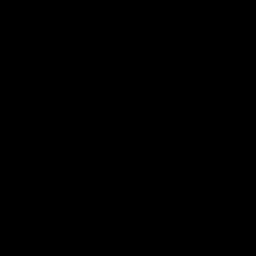

[Series 12: PD fat-sat · coronal · right · 4.0mm · 0.59mm/px · 7 of 32 slices shown (2 of 2)]
[im 1/32]
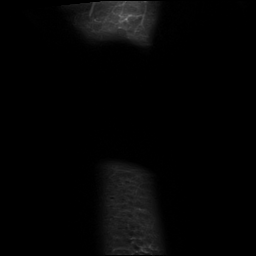
[im 6/32]
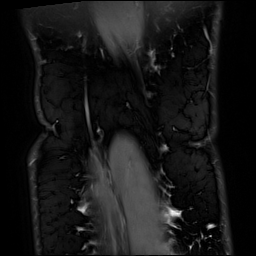
[im 11/32]
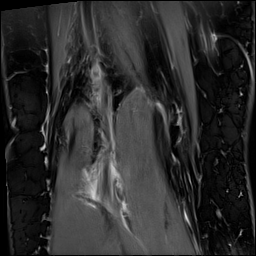
[im 16/32]
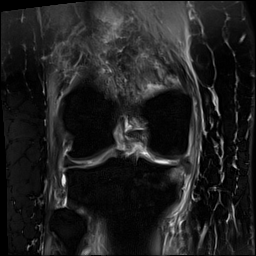
[im 21/32]
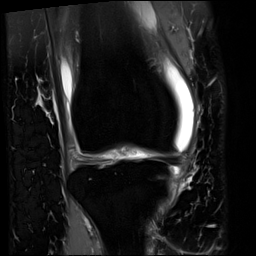
[im 26/32]
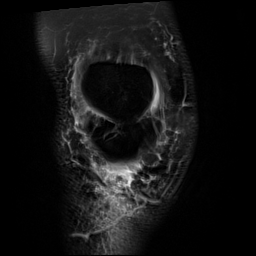
[im 32/32]
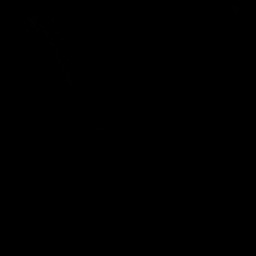

[Series 13: T2 fat-sat · sagittal · right · 3.0mm · 0.59mm/px · 8 of 35 slices shown (3 of 3)]
[im 1/35]
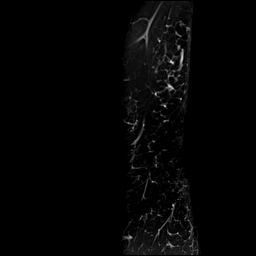
[im 5/35]
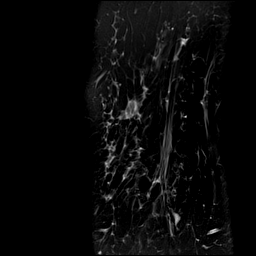
[im 10/35]
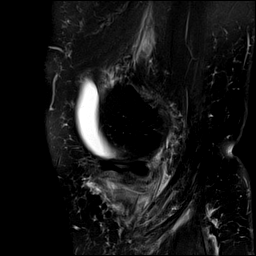
[im 15/35]
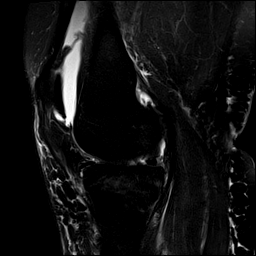
[im 20/35]
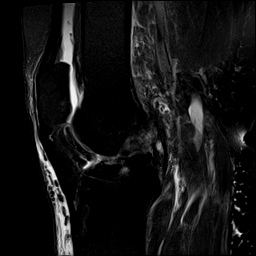
[im 25/35]
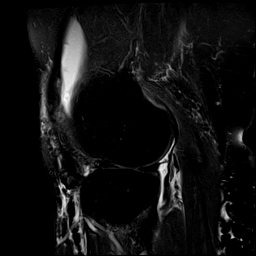
[im 30/35]
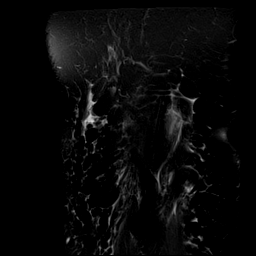
[im 35/35]
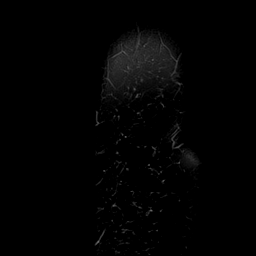

[40 of 40 positions shown; findings below may reference images not displayed]

FINDINGS: MENISCI

Medial: There is a complex tear of the posterior horn of medial
meniscus with a radial component at the posterior horn mid body
junction. There appears to be a meniscal fragment flipped upward in
the intracondylar notch at the posterior horn root junction, series
10, image 21 measuring 1.2 cm. Adjacent to the anterior horn of the
medial meniscus there is a 6 mm T2 bright cystic lesion.

Lateral: There is a nondisplaced peripheral horizontal tear of the
anterior horn of the lateral meniscus.

LIGAMENTS

Cruciates: The ACL is intact. The PCL is intact.

Collaterals: Increased signal seen around the superficial fibers of
the MCL, however it is intact. The lateral collateral ligamentous
complex is intact.

CARTILAGE

Patellofemoral: Areas of chondral fissuring and thinning seen within
the central patellar apex and medial patellar facet.

Medial compartment: There is deep chondral fissuring with near
complete cartilage loss seen at the weight-bearing surface of medial
femoral condyle and the medial tibial plateau with underlying
subchondral marrow signal change.

Lateral compartment: Chondral fissuring and thinning seen in the
weight-bearing surface of the lateral femoral condyle and the
lateral tibial plateau.

BONES: Increased marrow signal seen throughout the medial tibial
plateau. No osseous fracture is seen. No avascular necrosis. No
pathologic marrow infiltration.

JOINT: There is a moderate knee joint effusion with scattered
debris. Normal ABARAH. No plical thickening.

EXTENSOR MECHANISM: The patellar and quadriceps tendon are intact.
The retinaculum is unremarkable.

POPLITEAL FOSSA: No popliteal cyst is seen. However there is edema
within the popliteal fossa.

OTHER: Increased feathery signal seen within the popliteus and
soleus musculature with a small amount of intramuscular fluid.
Prepatellar subcutaneous edema is noted.
IMPRESSION: 1. Complex tear with a radial component of the posterior medial
meniscus with a 1.2 cm meniscal fragment flipped into the
intracondylar notch posteriorly.
2. Nondisplaced tear of the anterolateral meniscus
3. Tricompartmental osteoarthritis, advanced within the medial
compartment
4. Osseous contusion/reactive marrow within the medial tibial
plateau. No osseous fracture
5. Grade 1 medial collateral ligamentous sprain
6. Intramuscular edema of the popliteus and soleus
7. Moderate knee joint effusion with synovitis

## 2019-11-21 ENCOUNTER — Other Ambulatory Visit: Payer: Self-pay | Admitting: Orthopedic Surgery

## 2019-11-21 ENCOUNTER — Other Ambulatory Visit: Payer: Self-pay

## 2019-11-21 ENCOUNTER — Other Ambulatory Visit
Admission: RE | Admit: 2019-11-21 | Discharge: 2019-11-21 | Disposition: A | Discharge status: Home / Self Care | Payer: Medicare Other | Source: Ambulatory Visit | Attending: Orthopedic Surgery | Admitting: Orthopedic Surgery

## 2019-11-21 DIAGNOSIS — Z20822 Contact with and (suspected) exposure to covid-19: Secondary | ICD-10-CM | POA: Diagnosis not present

## 2019-11-21 DIAGNOSIS — Z01812 Encounter for preprocedural laboratory examination: Secondary | ICD-10-CM | POA: Insufficient documentation

## 2019-11-21 LAB — SARS CORONAVIRUS 2 (TAT 6-24 HRS): SARS Coronavirus 2: NEGATIVE

## 2019-11-22 ENCOUNTER — Encounter
Admission: RE | Admit: 2019-11-22 | Discharge: 2019-11-22 | Disposition: A | Discharge status: Home / Self Care | Payer: Medicare Other | Source: Ambulatory Visit | Attending: Orthopedic Surgery | Admitting: Orthopedic Surgery

## 2019-11-22 DIAGNOSIS — I10 Essential (primary) hypertension: Secondary | ICD-10-CM | POA: Insufficient documentation

## 2019-11-22 DIAGNOSIS — Z01818 Encounter for other preprocedural examination: Secondary | ICD-10-CM | POA: Diagnosis present

## 2019-11-22 DIAGNOSIS — Z8679 Personal history of other diseases of the circulatory system: Secondary | ICD-10-CM | POA: Diagnosis not present

## 2019-11-22 HISTORY — DX: Unspecified malignant neoplasm of skin, unspecified: C44.90

## 2019-11-22 HISTORY — DX: Other supraventricular tachycardia: I47.19

## 2019-11-22 HISTORY — DX: Gastro-esophageal reflux disease without esophagitis: K21.9

## 2019-11-22 HISTORY — DX: Supraventricular tachycardia: I47.1

## 2019-11-22 LAB — BASIC METABOLIC PANEL
Anion gap: 10 (ref 5–15)
BUN: 26 mg/dL — ABNORMAL HIGH (ref 8–23)
CO2: 28 mmol/L (ref 22–32)
Calcium: 8.9 mg/dL (ref 8.9–10.3)
Chloride: 96 mmol/L — ABNORMAL LOW (ref 98–111)
Creatinine, Ser: 0.83 mg/dL (ref 0.44–1.00)
GFR calc Af Amer: >60 mL/min (ref >60)
GFR calc non Af Amer: >60 mL/min (ref >60)
Glucose, Bld: 102 mg/dL — ABNORMAL HIGH (ref 70–99)
Potassium: 3.8 mmol/L (ref 3.5–5.1)
Sodium: 134 mmol/L — ABNORMAL LOW (ref 135–145)

## 2019-11-22 LAB — CBC
HCT: 39.1 % (ref 36.0–46.0)
Hemoglobin: 13.2 g/dL (ref 12.0–15.0)
MCH: 30.1 pg (ref 26.0–34.0)
MCHC: 33.8 g/dL (ref 30.0–36.0)
MCV: 89.3 fL (ref 80.0–100.0)
Platelets: 381 10*3/uL (ref 150–400)
RBC: 4.38 MIL/uL (ref 3.87–5.11)
RDW: 13.8 % (ref 11.5–15.5)
WBC: 9.5 10*3/uL (ref 4.0–10.5)
nRBC: 0 % (ref 0.0–0.2)

## 2019-11-22 NOTE — Patient Instructions (Addendum)
Your procedure is scheduled on: Monday, July 19 Report to Day Surgery on the 2nd floor of the [facility]. To find out your arrival time, please call [phone] between 1PM - 3PM on: Friday, July 16  REMEMBER: Instructions that are not followed completely may result in serious medical risk, up to and including death; or upon the discretion of your surgeon and anesthesiologist your surgery may need to be rescheduled.  Do not eat food after midnight the night before surgery.  No gum chewing, lozengers or hard candies.  You may however, drink CLEAR liquids up to 2 hours before you are scheduled to arrive for your surgery. Do not drink anything within 2 hours of your scheduled arrival time.  Clear liquids include: - water  - apple juice without pulp - gatorade (not RED) - black coffee or tea (Do NOT add milk or creamers to the coffee or tea) Do NOT drink anything that is not on this list.  ENSURE PRE-SURGERY CARBOHYDRATE DRINK:  Complete drinking 2 hours prior to scheduled arrival time.  TAKE THESE MEDICATIONS THE MORNING OF SURGERY WITH A SIP OF WATER:  1.  Tylenol or Hydrocodone if needed for pain 2.  Omeprazole - (take one the night before and one on the morning of surgery - helps to prevent nausea after surgery.)  Stop MELOXICAM, Anti-inflammatories (NSAIDS) such as Advil, Aleve, Ibuprofen, Motrin, Naproxen, Naprosyn and Aspirin based products such as Excedrin, Goodys Powder, BC Powder. (May take Tylenol or Acetaminophen if needed.)  Stop ANY OVER THE COUNTER supplements until after surgery. (May continue Vitamin D, Vitamin B, and multivitamin.)  No Alcohol for 24 hours before or after surgery.  On the morning of surgery brush your teeth with toothpaste and water, you may rinse your mouth with mouthwash if you wish. Do not swallow any toothpaste or mouthwash.  Do not wear jewelry, make-up, hairpins, clips or nail polish.  Do not wear lotions, powders, or perfumes.   Do  not shave 48 hours prior to surgery.   Do not bring valuables to the hospital. [facility] is not responsible for any missing/lost belongings or valuables.   Use CHG Soap as directed on instruction sheet.  Notify your doctor if there is any change in your medical condition (cold, fever, infection).  Wear comfortable clothing (specific to your surgery type) to the hospital.  Plan for stool softeners for home use; pain medications have a tendency to cause constipation. You can also help prevent constipation by eating foods high in fiber such as fruits and vegetables and drinking plenty of fluids as your diet allows.  After surgery, you can help prevent lung complications by doing breathing exercises.  Take deep breaths and cough every 1-2 hours. Your doctor may order a device called an Incentive Spirometer to help you take deep breaths.  If you are being discharged the day of surgery, you will not be allowed to drive home. You will need a responsible adult (18 years or older) to drive you home and stay with you that night.   If you are taking public transportation, you will need to have a responsible adult (18 years or older) with you. Please confirm with your physician that it is acceptable to use public transportation.   Please call the [facility] at [phone] if you have any questions about these instructions.  Visitation Policy:  Patients undergoing a surgery or procedure may have one family member or support person with them as long as that  person is not COVID-19 positive or experiencing its symptoms.  That person may remain in the waiting area during the procedure.  As a reminder, masks are still required for all [facility] team members, patients and visitors in all [facility] facilities.   Systemwide, no visitors 17 or younger.

## 2019-11-25 ENCOUNTER — Encounter
Admission: RE | Disposition: A | Discharge status: Home / Self Care | Payer: Self-pay | Source: Home / Self Care | Attending: Orthopedic Surgery

## 2019-11-25 ENCOUNTER — Ambulatory Visit: Payer: Medicare Other

## 2019-11-25 ENCOUNTER — Ambulatory Visit: Payer: Medicare Other | Admitting: Anesthesiology

## 2019-11-25 ENCOUNTER — Encounter: Payer: Self-pay | Admitting: Orthopedic Surgery

## 2019-11-25 ENCOUNTER — Other Ambulatory Visit: Payer: Self-pay

## 2019-11-25 ENCOUNTER — Ambulatory Visit
Admission: RE | Admit: 2019-11-25 | Discharge: 2019-11-25 | Disposition: A | Discharge status: Home / Self Care | Payer: Medicare Other | Attending: Orthopedic Surgery | Admitting: Orthopedic Surgery

## 2019-11-25 DIAGNOSIS — Z419 Encounter for procedure for purposes other than remedying health state, unspecified: Secondary | ICD-10-CM

## 2019-11-25 DIAGNOSIS — Z7983 Long term (current) use of bisphosphonates: Secondary | ICD-10-CM | POA: Insufficient documentation

## 2019-11-25 DIAGNOSIS — M2341 Loose body in knee, right knee: Secondary | ICD-10-CM | POA: Insufficient documentation

## 2019-11-25 DIAGNOSIS — Z8541 Personal history of malignant neoplasm of cervix uteri: Secondary | ICD-10-CM | POA: Diagnosis not present

## 2019-11-25 DIAGNOSIS — Z79899 Other long term (current) drug therapy: Secondary | ICD-10-CM | POA: Insufficient documentation

## 2019-11-25 DIAGNOSIS — Z85828 Personal history of other malignant neoplasm of skin: Secondary | ICD-10-CM | POA: Diagnosis not present

## 2019-11-25 DIAGNOSIS — S83281A Other tear of lateral meniscus, current injury, right knee, initial encounter: Secondary | ICD-10-CM | POA: Diagnosis not present

## 2019-11-25 DIAGNOSIS — K219 Gastro-esophageal reflux disease without esophagitis: Secondary | ICD-10-CM | POA: Diagnosis not present

## 2019-11-25 DIAGNOSIS — S83231A Complex tear of medial meniscus, current injury, right knee, initial encounter: Secondary | ICD-10-CM | POA: Diagnosis not present

## 2019-11-25 DIAGNOSIS — M84461A Pathological fracture, right tibia, initial encounter for fracture: Secondary | ICD-10-CM | POA: Insufficient documentation

## 2019-11-25 DIAGNOSIS — M659 Synovitis and tenosynovitis, unspecified: Secondary | ICD-10-CM | POA: Diagnosis not present

## 2019-11-25 DIAGNOSIS — X58XXXA Exposure to other specified factors, initial encounter: Secondary | ICD-10-CM | POA: Diagnosis not present

## 2019-11-25 DIAGNOSIS — I10 Essential (primary) hypertension: Secondary | ICD-10-CM | POA: Diagnosis not present

## 2019-11-25 HISTORY — PX: KNEE ARTHROSCOPY WITH SUBCHONDROPLASTY: SHX6732

## 2019-11-25 IMAGING — XA DG KNEE 1-2V*R*
6 series · 6 of 6 positions shown · non-contrast
Comparison: None.

CLINICAL DATA: Right knee subchondroplasty

EXAM:
RIGHT KNEE - 1-2 VIEW; DG C-ARM 1-60 MIN

[Series 1: ortho standard · 1 of 1 slices shown (1 of 6)]
[im 1/1]
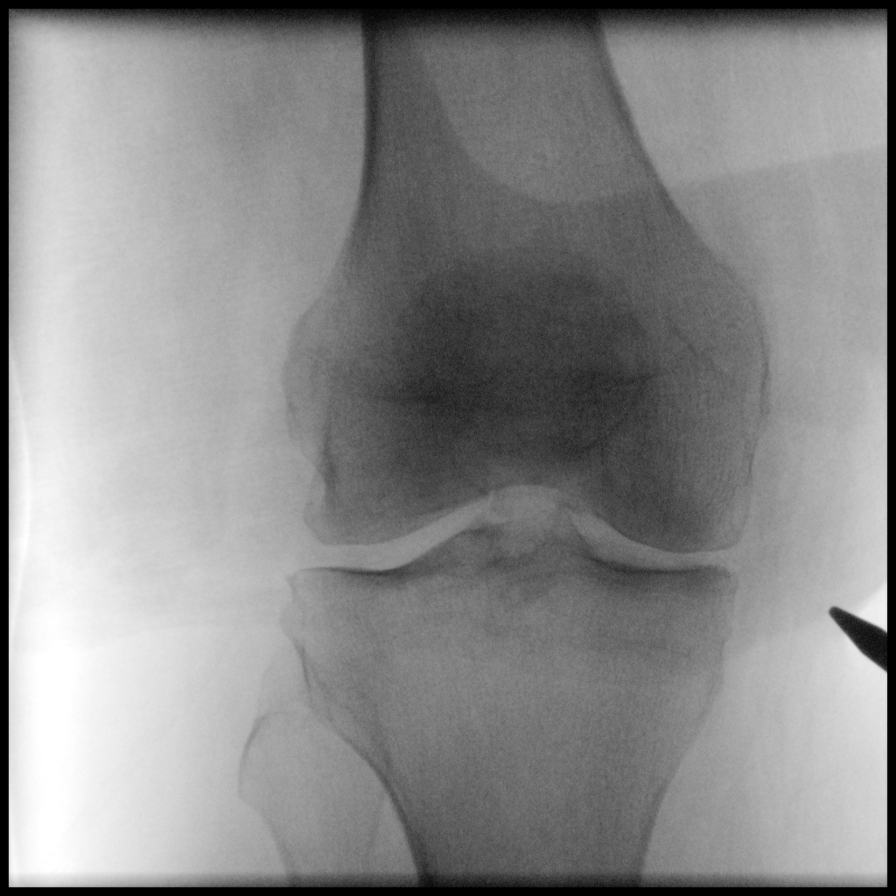

[Series 2: ortho standard · 1 of 1 slices shown (2 of 6)]
[im 1/1]
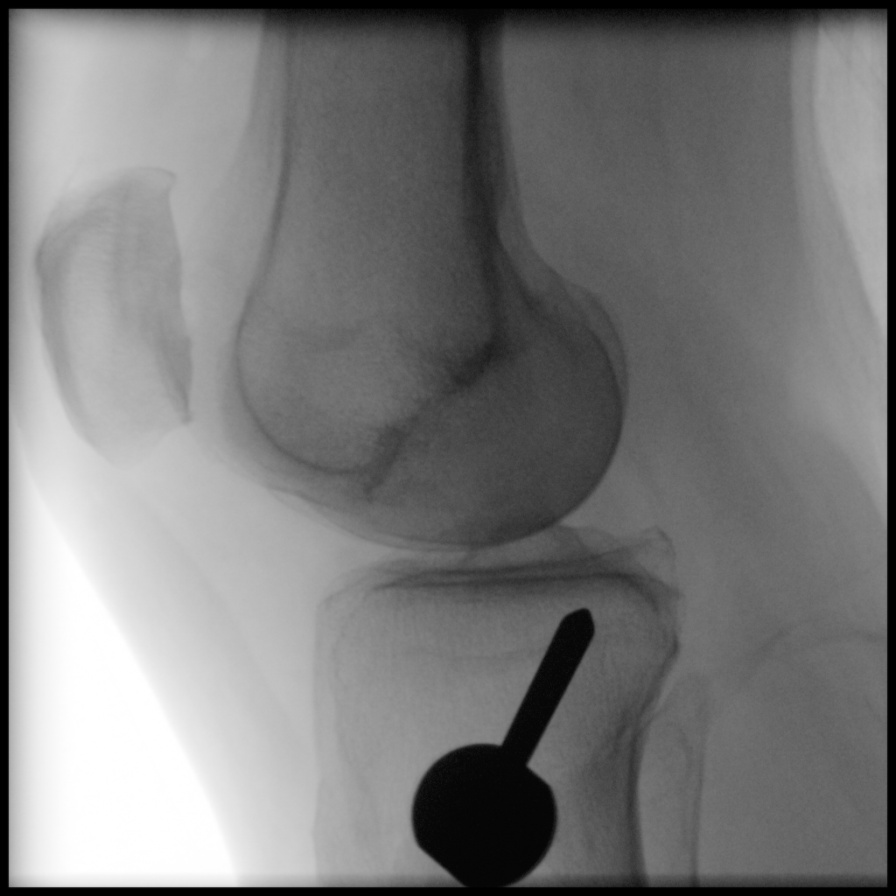

[Series 3: ortho standard · 1 of 1 slices shown (3 of 6)]
[im 1/1]
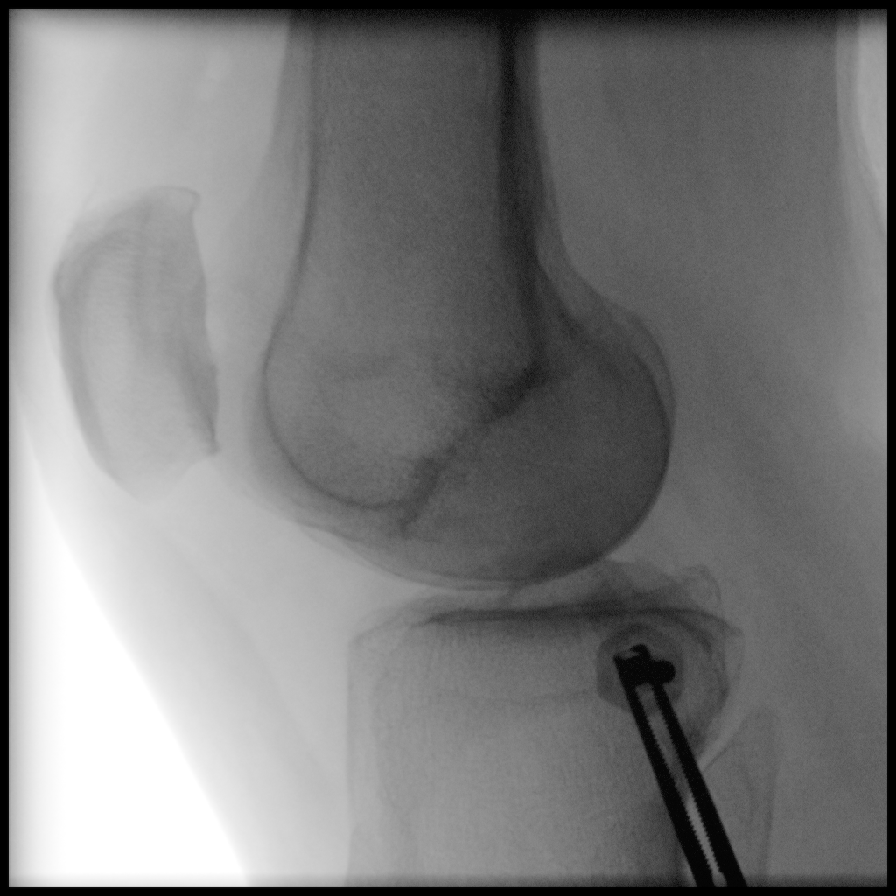

[Series 7: ortho standard · 1 of 1 slices shown (4 of 6)]
[im 1/1]
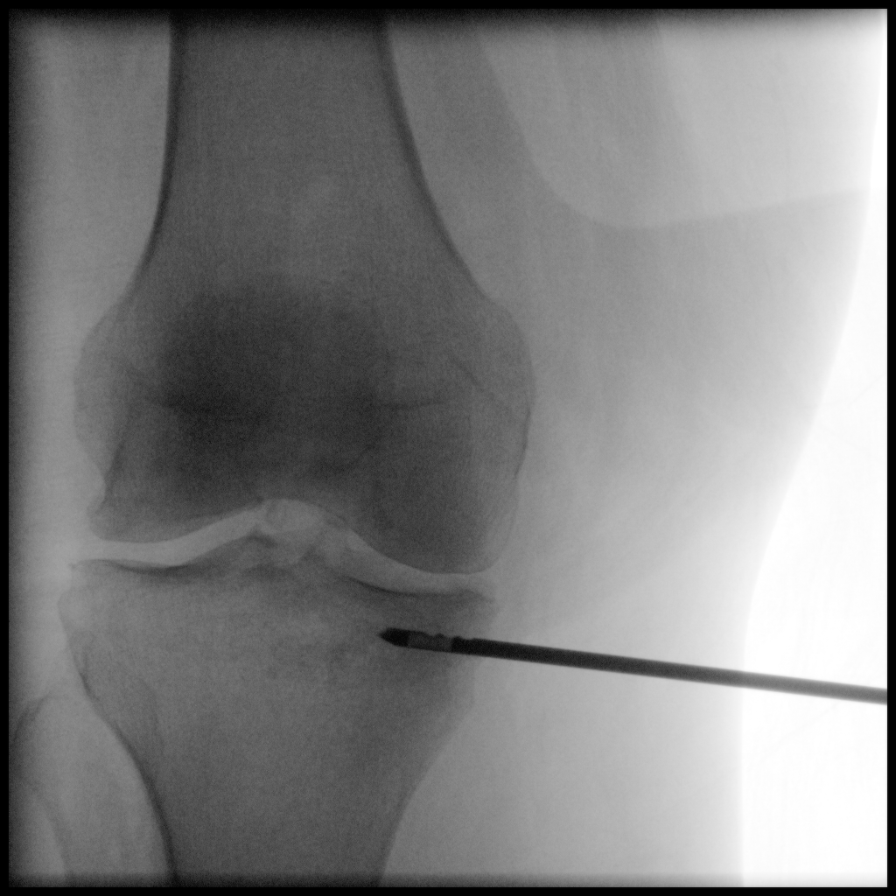

[Series 9: ortho standard · 1 of 1 slices shown (5 of 6)]
[im 1/1]
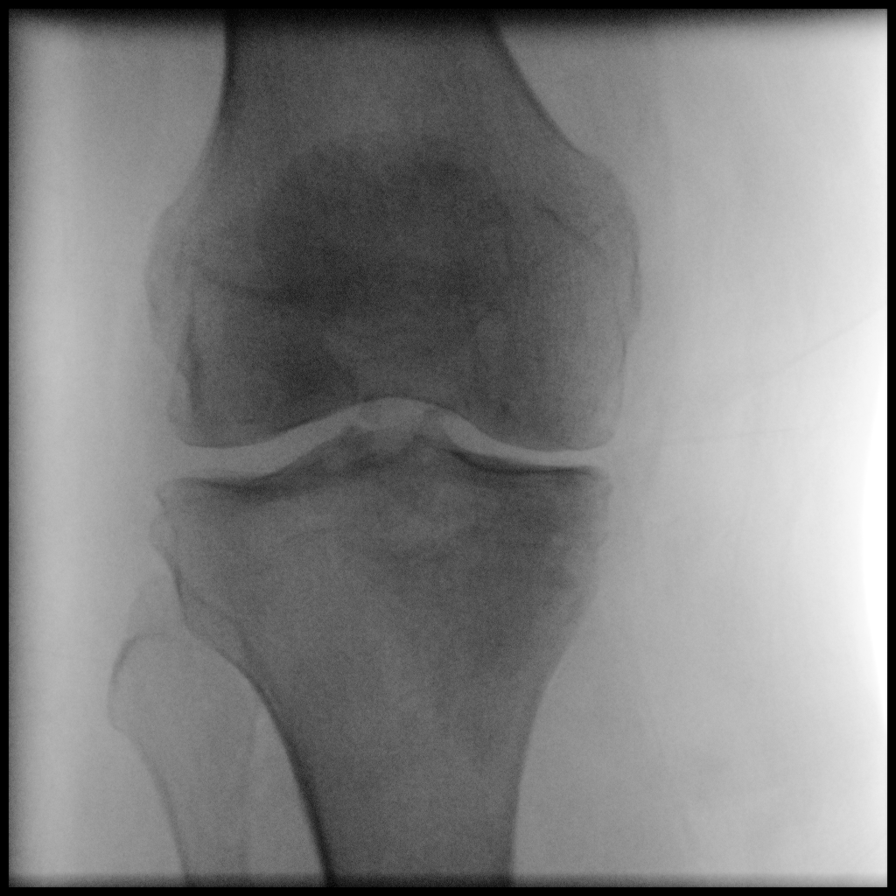

[Series 10: ortho standard · 1 of 1 slices shown (6 of 6)]
[im 1/1]
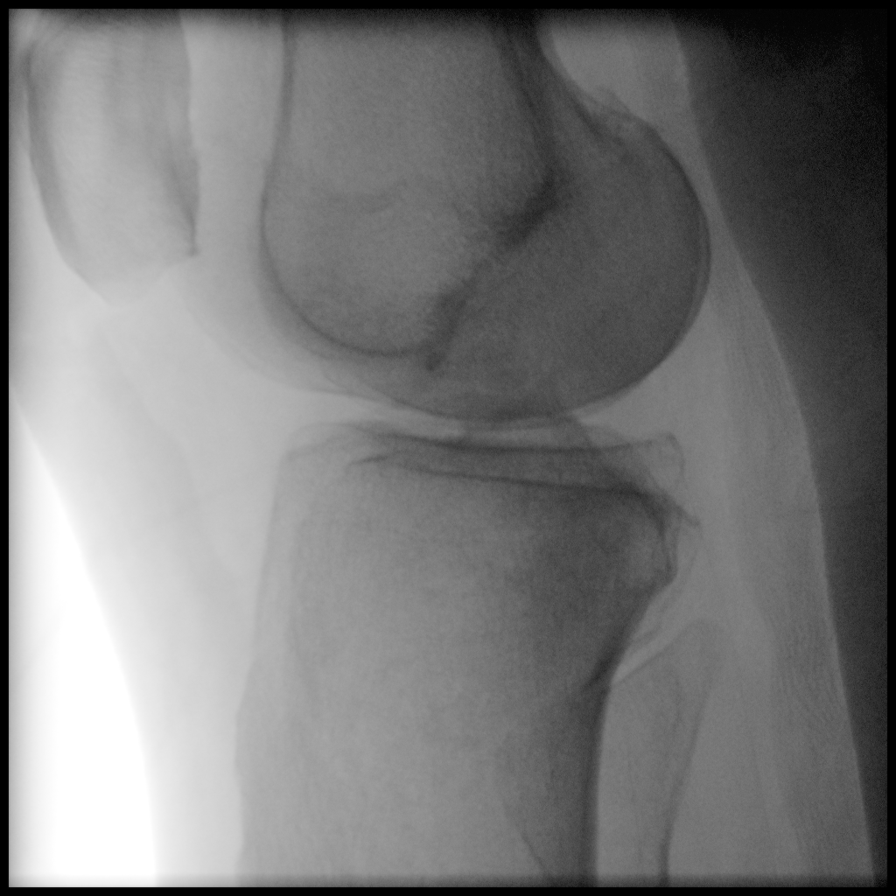

[6 of 6 positions shown; findings below may reference images not displayed]

FLUOROSCOPY TIME:  Fluoroscopy Time:  24 seconds

Radiation Exposure Index (if provided by the fluoroscopic device):
1.5 mGy

Number of Acquired Spot Images: 6
FINDINGS: Initial images demonstrate trocar needle in the medial soft tissues
adjacent to the proximal tibia. Injection needle was then placed
beneath the medial tibial plateau and calcium phosphate was then
injected a and distributed throughout the medial proximal tibia.
IMPRESSION: Medial tibial plateau subchondroplasty

## 2019-11-25 IMAGING — XA DG C-ARM 1-60 MIN
6 series · 6 of 6 positions shown · non-contrast
Comparison: None.

CLINICAL DATA: Right knee subchondroplasty

EXAM:
RIGHT KNEE - 1-2 VIEW; DG C-ARM 1-60 MIN

[Series 1: ortho standard · 1 of 1 slices shown (1 of 6)]
[im 1/1]
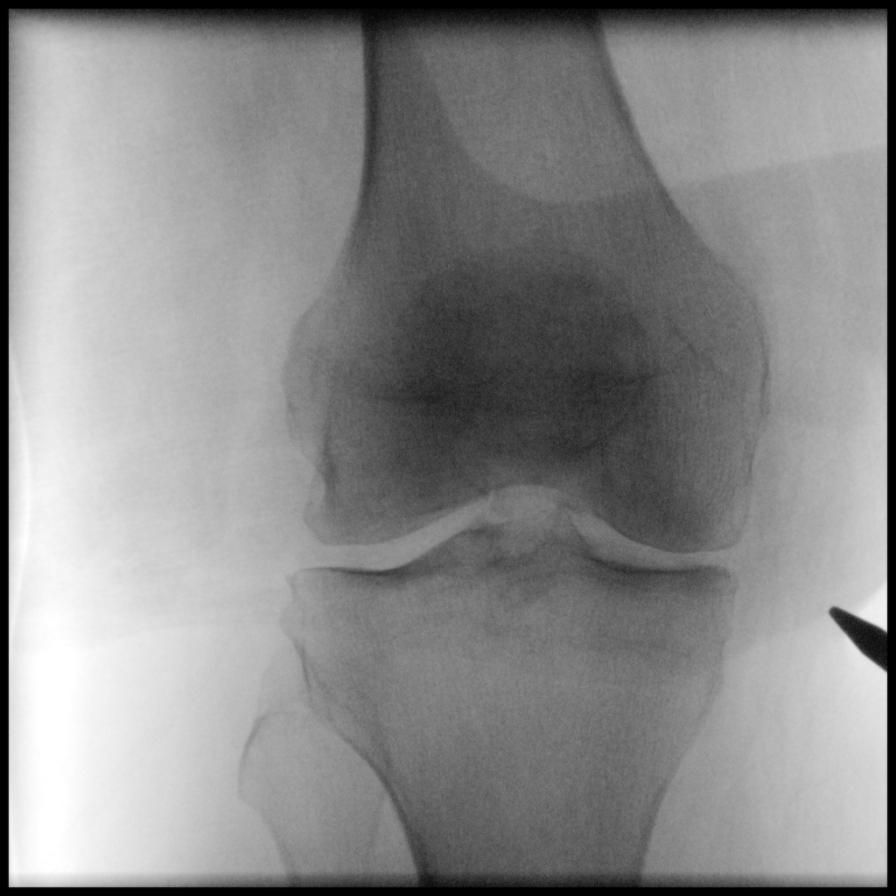

[Series 2: ortho standard · 1 of 1 slices shown (2 of 6)]
[im 1/1]
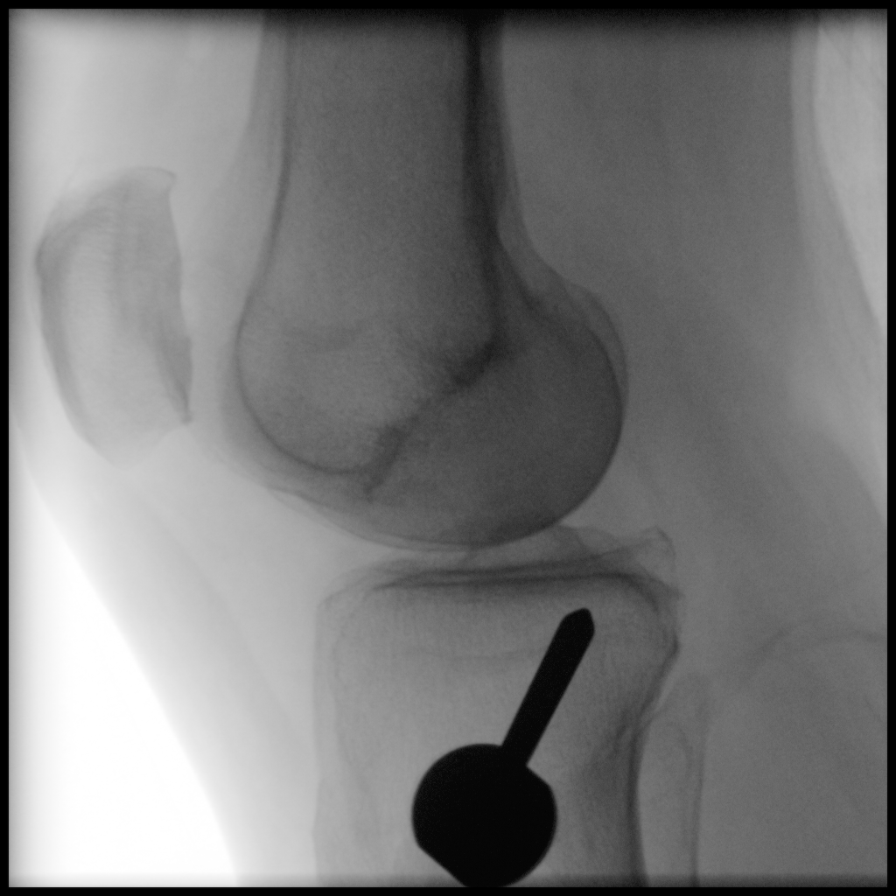

[Series 3: ortho standard · 1 of 1 slices shown (3 of 6)]
[im 1/1]
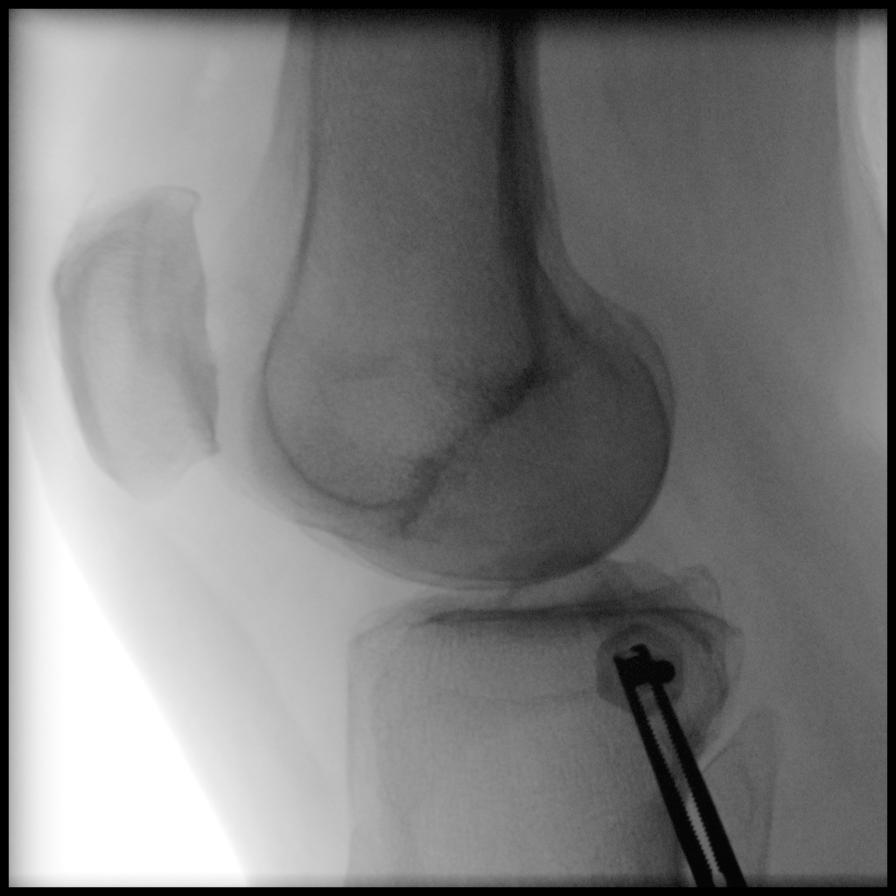

[Series 7: ortho standard · 1 of 1 slices shown (4 of 6)]
[im 1/1]
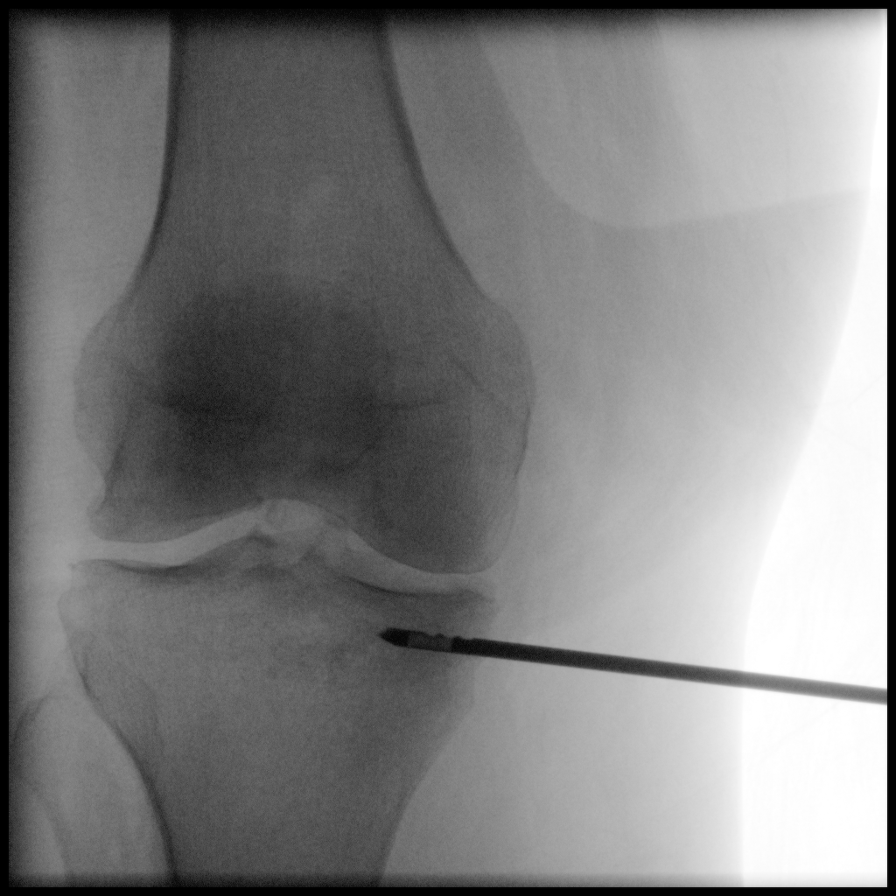

[Series 9: ortho standard · 1 of 1 slices shown (5 of 6)]
[im 1/1]
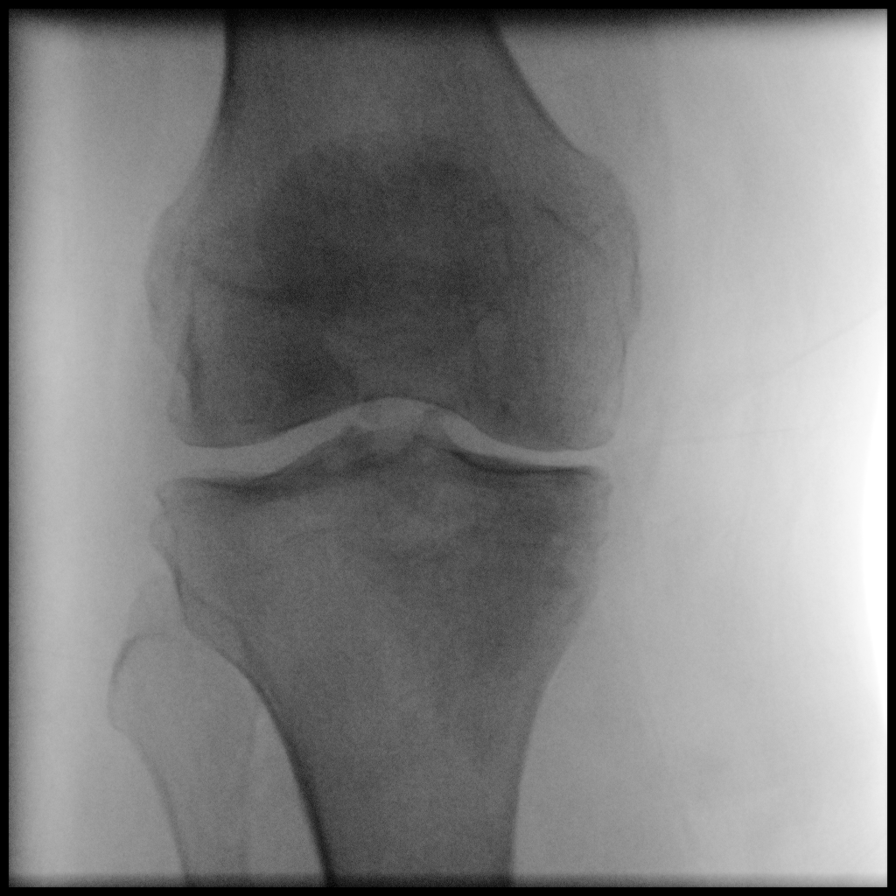

[Series 10: ortho standard · 1 of 1 slices shown (6 of 6)]
[im 1/1]
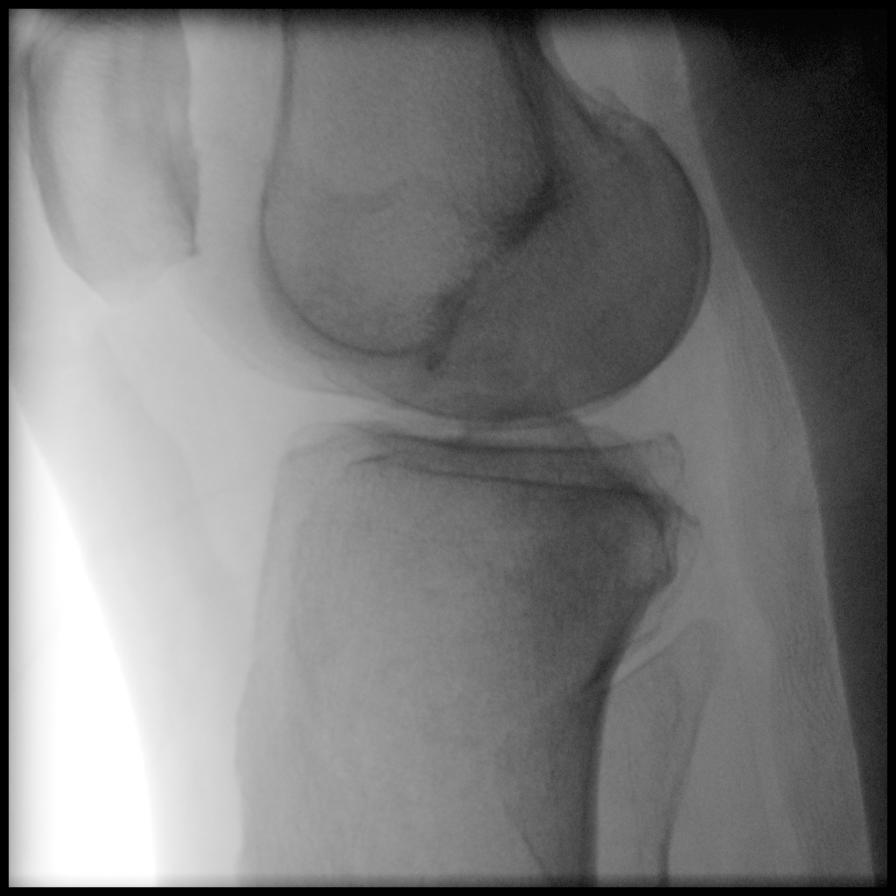

[6 of 6 positions shown; findings below may reference images not displayed]

FLUOROSCOPY TIME:  Fluoroscopy Time:  24 seconds

Radiation Exposure Index (if provided by the fluoroscopic device):
1.5 mGy

Number of Acquired Spot Images: 6
FINDINGS: Initial images demonstrate trocar needle in the medial soft tissues
adjacent to the proximal tibia. Injection needle was then placed
beneath the medial tibial plateau and calcium phosphate was then
injected a and distributed throughout the medial proximal tibia.
IMPRESSION: Medial tibial plateau subchondroplasty

## 2019-11-25 SURGERY — ARTHROSCOPY, KNEE, WITH SUBCHONDROPLASTY
Anesthesia: General | Site: Knee | Laterality: Right

## 2019-11-25 MED ORDER — FENTANYL CITRATE (PF) 100 MCG/2ML IJ SOLN
INTRAMUSCULAR | Status: AC
  Administered 2019-11-25: 25 ug via INTRAVENOUS
  Filled 2019-11-25: qty 2

## 2019-11-25 MED ORDER — EPINEPHRINE PF 1 MG/ML IJ SOLN
INTRAMUSCULAR | Status: AC
  Filled 2019-11-25: qty 1

## 2019-11-25 MED ORDER — ACETAMINOPHEN 500 MG PO TABS
1000.0000 mg | ORAL_TABLET | Freq: Three times a day (TID) | ORAL | 2 refills | Status: AC
Start: 2019-11-25 — End: 2020-11-24

## 2019-11-25 MED ORDER — ASPIRIN EC 325 MG PO TBEC
325.0000 mg | DELAYED_RELEASE_TABLET | Freq: Every day | ORAL | 0 refills | Status: AC
Start: 2019-11-25 — End: 2019-12-09

## 2019-11-25 MED ORDER — ONDANSETRON HCL 4 MG/2ML IJ SOLN: 4.0000 mg | Freq: Once | INTRAMUSCULAR | Status: DC | PRN

## 2019-11-25 MED ORDER — HYDROCODONE-ACETAMINOPHEN 5-325 MG PO TABS: 1.0000 | ORAL_TABLET | ORAL | 0 refills | Status: DC | PRN

## 2019-11-25 MED ORDER — FENTANYL CITRATE (PF) 100 MCG/2ML IJ SOLN
INTRAMUSCULAR | Status: AC
  Filled 2019-11-25: qty 2

## 2019-11-25 MED ORDER — LIDOCAINE-EPINEPHRINE 1 %-1:100000 IJ SOLN
INTRAMUSCULAR | Status: AC
  Filled 2019-11-25: qty 1

## 2019-11-25 MED ORDER — ACETAMINOPHEN 10 MG/ML IV SOLN
INTRAVENOUS | Status: DC | PRN
  Administered 2019-11-25: 1000 mg via INTRAVENOUS

## 2019-11-25 MED ORDER — OXYCODONE HCL 5 MG/5ML PO SOLN: 5.0000 mg | Freq: Once | ORAL | Status: AC | PRN

## 2019-11-25 MED ORDER — CLINDAMYCIN PHOSPHATE 900 MG/50ML IV SOLN
INTRAVENOUS | Status: AC
  Filled 2019-11-25: qty 50

## 2019-11-25 MED ORDER — ONDANSETRON HCL 4 MG/2ML IJ SOLN
INTRAMUSCULAR | Status: AC
  Filled 2019-11-25: qty 2

## 2019-11-25 MED ORDER — LIDOCAINE HCL (CARDIAC) PF 100 MG/5ML IV SOSY
PREFILLED_SYRINGE | INTRAVENOUS | Status: DC | PRN
  Administered 2019-11-25: 100 mg via INTRAVENOUS

## 2019-11-25 MED ORDER — ONDANSETRON HCL 4 MG/2ML IJ SOLN
INTRAMUSCULAR | Status: DC | PRN
  Administered 2019-11-25: 4 mg via INTRAVENOUS

## 2019-11-25 MED ORDER — LIDOCAINE-EPINEPHRINE 1 %-1:100000 IJ SOLN
INTRAMUSCULAR | Status: DC | PRN
  Administered 2019-11-25: 2 mL

## 2019-11-25 MED ORDER — OXYCODONE HCL 5 MG PO TABS: 5.0000 mg | ORAL_TABLET | Freq: Once | ORAL | Status: AC | PRN

## 2019-11-25 MED ORDER — LACTATED RINGERS IV SOLN: INTRAVENOUS | Status: DC

## 2019-11-25 MED ORDER — PROPOFOL 10 MG/ML IV BOLUS
INTRAVENOUS | Status: DC | PRN
  Administered 2019-11-25: 150 mg via INTRAVENOUS

## 2019-11-25 MED ORDER — CHLORHEXIDINE GLUCONATE 0.12 % MT SOLN: 15.0000 mL | Freq: Once | OROMUCOSAL | Status: AC

## 2019-11-25 MED ORDER — LACTATED RINGERS IV SOLN
INTRAVENOUS | Status: DC | PRN
  Administered 2019-11-25: 3000 mL

## 2019-11-25 MED ORDER — LIDOCAINE HCL (PF) 2 % IJ SOLN
INTRAMUSCULAR | Status: AC
  Filled 2019-11-25: qty 5

## 2019-11-25 MED ORDER — ORAL CARE MOUTH RINSE: 15.0000 mL | Freq: Once | OROMUCOSAL | Status: AC

## 2019-11-25 MED ORDER — DEXAMETHASONE SODIUM PHOSPHATE 10 MG/ML IJ SOLN
INTRAMUSCULAR | Status: DC | PRN
  Administered 2019-11-25: 10 mg via INTRAVENOUS

## 2019-11-25 MED ORDER — PROPOFOL 10 MG/ML IV BOLUS
INTRAVENOUS | Status: AC
  Filled 2019-11-25: qty 20

## 2019-11-25 MED ORDER — FENTANYL CITRATE (PF) 100 MCG/2ML IJ SOLN
INTRAMUSCULAR | Status: DC | PRN
  Administered 2019-11-25: 100 ug via INTRAVENOUS
  Administered 2019-11-25 (×3): 50 ug via INTRAVENOUS

## 2019-11-25 MED ORDER — CLINDAMYCIN PHOSPHATE 900 MG/50ML IV SOLN
900.0000 mg | INTRAVENOUS | Status: AC
  Administered 2019-11-25: 900 mg via INTRAVENOUS

## 2019-11-25 MED ORDER — ACETAMINOPHEN 10 MG/ML IV SOLN
INTRAVENOUS | Status: AC
  Filled 2019-11-25: qty 100

## 2019-11-25 MED ORDER — OXYCODONE HCL 5 MG PO TABS
ORAL_TABLET | ORAL | Status: AC
  Administered 2019-11-25: 5 mg via ORAL
  Filled 2019-11-25: qty 1

## 2019-11-25 MED ORDER — FENTANYL CITRATE (PF) 100 MCG/2ML IJ SOLN
25.0000 ug | INTRAMUSCULAR | Status: DC | PRN
  Administered 2019-11-25 (×3): 25 ug via INTRAVENOUS

## 2019-11-25 MED ORDER — DEXAMETHASONE SODIUM PHOSPHATE 10 MG/ML IJ SOLN
INTRAMUSCULAR | Status: AC
  Filled 2019-11-25: qty 1

## 2019-11-25 MED ORDER — BUPIVACAINE HCL (PF) 0.5 % IJ SOLN
INTRAMUSCULAR | Status: DC | PRN
  Administered 2019-11-25: 2 mL

## 2019-11-25 MED ORDER — FENTANYL CITRATE (PF) 250 MCG/5ML IJ SOLN
INTRAMUSCULAR | Status: AC
  Filled 2019-11-25: qty 5

## 2019-11-25 MED ORDER — ONDANSETRON 4 MG PO TBDP
4.0000 mg | ORAL_TABLET | Freq: Three times a day (TID) | ORAL | 0 refills | Status: DC | PRN
Start: 2019-11-25 — End: 2019-12-24

## 2019-11-25 MED ORDER — CHLORHEXIDINE GLUCONATE 0.12 % MT SOLN
OROMUCOSAL | Status: AC
Start: 2019-11-25 — End: 2019-11-25
  Administered 2019-11-25: 15 mL via OROMUCOSAL
  Filled 2019-11-25: qty 15

## 2019-11-25 SURGICAL SUPPLY — 49 items
ADAPTER IRRIG TUBE 2 SPIKE SOL (ADAPTER) ×6 IMPLANT
BLADE SURG SZ11 CARB STEEL (BLADE) ×3 IMPLANT
BNDG COHESIVE 6X5 TAN STRL LF (GAUZE/BANDAGES/DRESSINGS) ×3 IMPLANT
BNDG ELASTIC 6X5.8 VLCR STR LF (GAUZE/BANDAGES/DRESSINGS) ×3 IMPLANT
BNDG ESMARK 6X12 TAN STRL LF (GAUZE/BANDAGES/DRESSINGS) ×3 IMPLANT
BUR RADIUS 3.5 (BURR) IMPLANT
BUR RADIUS 4.0X18.5 (BURR) IMPLANT
CAST PADDING 6X4YD ST 30248 (SOFTGOODS) ×2
CHLORAPREP W/TINT 26 (MISCELLANEOUS) ×3 IMPLANT
COOLER POLAR GLACIER W/PUMP (MISCELLANEOUS) ×3 IMPLANT
COVER WAND RF STERILE (DRAPES) ×3 IMPLANT
CUFF TOURN SGL QUICK 24 (TOURNIQUET CUFF)
CUFF TOURN SGL QUICK 30 (TOURNIQUET CUFF) ×2
CUFF TRNQT CYL 24X4X16.5-23 (TOURNIQUET CUFF) IMPLANT
CUFF TRNQT CYL 30X4X21-28X (TOURNIQUET CUFF) ×1 IMPLANT
DEVICE SUCT BLK HOLE OR FLOOR (MISCELLANEOUS) ×3 IMPLANT
DRAPE C-ARM XRAY 36X54 (DRAPES) ×3 IMPLANT
DRAPE IMP U-DRAPE 54X76 (DRAPES) ×3 IMPLANT
ELECT REM PT RETURN 9FT ADLT (ELECTROSURGICAL)
ELECTRODE REM PT RTRN 9FT ADLT (ELECTROSURGICAL) IMPLANT
GAUZE SPONGE 4X4 12PLY STRL (GAUZE/BANDAGES/DRESSINGS) ×3 IMPLANT
GLOVE BIOGEL PI IND STRL 8 (GLOVE) ×1 IMPLANT
GLOVE BIOGEL PI INDICATOR 8 (GLOVE) ×2
GLOVE SURG ORTHO 8.0 STRL STRW (GLOVE) ×6 IMPLANT
GOWN STRL REUS W/ TWL LRG LVL3 (GOWN DISPOSABLE) ×1 IMPLANT
GOWN STRL REUS W/ TWL XL LVL3 (GOWN DISPOSABLE) ×1 IMPLANT
GOWN STRL REUS W/TWL LRG LVL3 (GOWN DISPOSABLE) ×2
GOWN STRL REUS W/TWL XL LVL3 (GOWN DISPOSABLE) ×2
IV LACTATED RINGER IRRG 3000ML (IV SOLUTION) ×8
IV LR IRRIG 3000ML ARTHROMATIC (IV SOLUTION) ×4 IMPLANT
KIT ACCUFILL 5CC (Knees) ×1 IMPLANT
KIT KNEE SCP 414.502 (Knees) ×3 IMPLANT
KIT TURNOVER KIT A (KITS) ×3 IMPLANT
MANIFOLD NEPTUNE II (INSTRUMENTS) ×3 IMPLANT
MAT ABSORB  FLUID 56X50 GRAY (MISCELLANEOUS) ×4
MAT ABSORB FLUID 56X50 GRAY (MISCELLANEOUS) ×2 IMPLANT
PACK KNEE ARTHRO (MISCELLANEOUS) ×3 IMPLANT
PAD ABD DERMACEA PRESS 5X9 (GAUZE/BANDAGES/DRESSINGS) ×6 IMPLANT
PAD WRAPON POLAR KNEE (MISCELLANEOUS) ×1 IMPLANT
PADDING CAST COTTON 6X4 ST (SOFTGOODS) ×1 IMPLANT
PENCIL ELECTRO HAND CTR (MISCELLANEOUS) IMPLANT
SET TUBE SUCT SHAVER OUTFL 24K (TUBING) ×3 IMPLANT
SET TUBE TIP INTRA-ARTICULAR (MISCELLANEOUS) ×3 IMPLANT
SUT ETHILON 3-0 FS-10 30 BLK (SUTURE) ×3
SUTURE EHLN 3-0 FS-10 30 BLK (SUTURE) ×1 IMPLANT
TOWEL OR 17X26 4PK STRL BLUE (TOWEL DISPOSABLE) ×6 IMPLANT
TUBING ARTHRO INFLOW-ONLY STRL (TUBING) ×3 IMPLANT
WAND WEREWOLF FLOW 90D (MISCELLANEOUS) IMPLANT
WRAPON POLAR PAD KNEE (MISCELLANEOUS) ×3

## 2019-11-25 NOTE — Discharge Instructions (Signed)
AMBULATORY SURGERY  DISCHARGE INSTRUCTIONS   1) The drugs that you were given will stay in your system until tomorrow so for the next 24 hours you should not:  A) Drive an automobile B) Make any legal decisions C) Drink any alcoholic beverage   2) You may resume regular meals tomorrow.  Today it is better to start with liquids and gradually work up to solid foods.  You may eat anything you prefer, but it is better to start with liquids, then soup and crackers, and gradually work up to solid foods.   3) Please notify your doctor immediately if you have any unusual bleeding, trouble breathing, redness and pain at the surgery site, drainage, fever, or pain not relieved by medication. 4)   5) Your post-operative visit with Dr.                                     is: Date:                        Time:    Please call to schedule your post-operative visit.  6) Additional Instructions:     Arthroscopic Knee Surgery - Partial Meniscectomy/Subchondroplasty   Post-Op Instructions   1. Bracing or crutches: Crutches/walker will be provided at the time of discharge from the surgery center if you do not already have them.   2. Ice: You may be provided with a device [facility]) that allows you to ice the affected area effectively. Otherwise you can ice manually.    3. Driving:  Plan on not driving for at least two weeks. Please note that you are advised NOT to drive while taking narcotic pain medications as you may be impaired and unsafe to drive.   4. Activity: Ankle pumps several times an hour while awake to prevent blood clots. Weight bearing: as tolerated. Use crutches for as needed (usually ~1 week or less) until pain allows you to ambulate without a limp. Bending and straightening the knee is unlimited. Elevate knee above heart level as much as possible for one week. Avoid standing more than 5 minutes (consecutively) for the first week.  Avoid long distance travel for 2 weeks.  5.  Medications:  - You have been provided a prescription for narcotic pain medicine. After surgery, take 1-2 narcotic tablets every 4 hours if needed for severe pain.  - You may take up to 3000mg /day of tylenol (acetaminophen). You can take 1000mg  3x/day. Please check your narcotic. If you have acetaminophen in your narcotic (each tablet will be 325mg ), be careful not to exceed a total of 3000mg /day of acetaminophen.  - A prescription for anti-nausea medication will be provided in case the narcotic medicine or anesthesia causes nausea - take 1 tablet every 6 hours only if nauseated.  - Take meloxicam 15mg  (two 7.5mg  tablets) daily for anti-inflammatory and pain relief purposes.  - Take enteric coated aspirin 325 mg once daily for 2 weeks to prevent blood clots.    6. Bandages: The physical therapist should change the bandages at the first post-op appointment. If needed, the dressing supplies have been provided to you.   7. Physical Therapy: 1-2 times per week for 6 weeks. Therapy typically starts 3-7 days after surgery. You have been provided an order for physical therapy. The therapist will provide home exercises.   8. Work: May return to full work usually around  2 weeks after 1st post-operative visit. May do light duty/desk job in approximately 1-2 weeks when off of narcotics, pain is well-controlled, and swelling has decreased. Labor intensive jobs may require 4-6 weeks to return.      9. Post-Op Appointments: Your first post-op appointment will be with Dr. Posey Pronto in approximately 2 weeks time.    If you find that they have not been scheduled please call the Orthopaedic Appointment front desk at [phone].

## 2019-11-25 NOTE — H&P (Signed)
Paper H&P to be scanned into permanent record. H&P reviewed. No significant changes noted.  

## 2019-11-25 NOTE — Transfer of Care (Signed)
Immediate Anesthesia Transfer of Care Note  Patient: Megan Montgomery  Procedure(s) Performed: Right knee arthroscopic partial medial meniscectomy with subchondroplasty of the medial tibial plateau. (Right Knee)  Patient Location: PACU  Anesthesia Type:MAC  Level of Consciousness: sedated and patient cooperative  Airway & Oxygen Therapy: Patient Spontanous Breathing and Patient connected to face mask oxygen  Post-op Assessment: Report given to RN and Post -op Vital signs reviewed and stable  Post vital signs: Reviewed and stable  Last Vitals:  Vitals Value Taken Time  BP 113/81 11/25/19 1212  Temp    Pulse 76 11/25/19 1213  Resp 22 11/25/19 1212  SpO2 99 % 11/25/19 1213  Vitals shown include unvalidated device data.  Last Pain:  Vitals:   11/25/19 0941  TempSrc:   PainSc: 0-No pain         Complications: No complications documented.

## 2019-11-25 NOTE — Anesthesia Preprocedure Evaluation (Addendum)
Anesthesia Evaluation  Patient identified by MRN, date of birth, ID band Patient awake    Reviewed: Allergy & Precautions, H&P , NPO status , Patient's Chart, lab work & pertinent test results  History of Anesthesia Complications Negative for: history of anesthetic complications  Airway Mallampati: II  TM Distance: <3 FB Neck ROM: limited   Comment: Small chin Dental  (+) Teeth Intact, Implants   Pulmonary neg COPD, former smoker,    breath sounds clear to auscultation       Cardiovascular hypertension, On Medications (-) angina(-) Past MI, (-) Cardiac Stents and (-) CHF + dysrhythmias (h/o paroxysmal atrial tachycardia, described as occasional palpitations that resolved on their own. No AFib. 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, thoracic No date: GERD (gastroesophageal reflux disease) No date: Hypertension No date: PAT (paroxysmal atrial tachycardia) ([facility]) No date: Skin cancer     Comment:  Neck  Past Surgical History: No date: ABDOMINAL HYSTERECTOMY No date: BREAST BIOPSY; Right     Comment:  neg bx/clip No date: CATARACT EXTRACTION W/ INTRAOCULAR LENS  IMPLANT, BILATERAL;  Bilateral No date: EYE SURGERY No date: OOPHORECTOMY; Right 2014: SHOULDER SURGERY; Left     Comment:  partial removal of AC joint d/t osteoarthritis No date: TONSILLECTOMY     Reproductive/Obstetrics negative OB ROS                           Anesthesia Physical Anesthesia Plan  ASA: II  Anesthesia Plan: General ETT   Post-op Pain Management:     Induction:   PONV Risk Score and Plan: Ondansetron, Dexamethasone and Treatment may vary due to age or medical condition  Airway Management Planned:   Additional Equipment:   Intra-op Plan:   Post-operative Plan:   Informed Consent: I have reviewed the patients History and Physical, chart, labs and discussed the procedure including the risks, benefits and alternatives for the proposed anesthesia with the patient or authorized representative who has indicated his/her understanding and acceptance.     Dental Advisory Given  Plan Discussed with: Anesthesiologist, CRNA and Surgeon  Anesthesia Plan Comments:         Anesthesia Quick Evaluation

## 2019-11-25 NOTE — Op Note (Signed)
Operative Note    SURGERY DATE: 11/25/2019   PRE-OP DIAGNOSIS:  1.  Right subchondral insufficiency fracture of medial tibial plateau 2.  Right medial meniscus tear and lateral meniscus tear 3.  Right mild left tricompartmental degenerative changes   POST-OP DIAGNOSIS:  1.  Right subchondral insufficiency fracture of medial tibial plateau 2.  Right medial meniscus tear 3.  Right mild left tricompartmental degenerative changes 4.  Right knee loose bodies 5.  Right knee synovitis   PROCEDURES:  1. Right knee arthroscopically assisted subchondroplasty of medial tibial plateau (treatment of medial tibial plateau fracture) 2. Right knee arthroscopy, partial medial meniscectomy 3. Right knee removal of loose bodies 4. Right knee chondroplasty of medial and patellofemoral compartments 5. Right knee partial synovectomy of the patellofemoral compartment   SURGEON: Cato Mulligan, MD   ANESTHESIA: Regional + Gen   ESTIMATED BLOOD LOSS: minimal   TOTAL IV FLUIDS: per anesthesia   INDICATION(S):  Megan Montgomery is a 74 y.o. female with signs and symptoms as well as MRI finding of bone marrow edema and subchondral insufficiency fracture of the medial tibial plateau with significantly displaced medial meniscus tear into the intercondylar notch as well as the medial tibial recess. The patient underwent a trial of nonsurgical management in the form of activity modifications and medical management without improvement in symptoms.  Given minimal degenerative changes on radiographs with relatively well-maintained joint spaces and displacement of meniscus fragments, and after discussion of risks, benefits, and alternatives to surgery, the patient elected to proceed.  The patient understands that she may still end up needing an arthroplasty type procedure in the future.   OPERATIVE FINDINGS:    Examination under anesthesia: A careful examination under anesthesia was performed.  Passive range of motion  was: Hyperextension: 1.  Extension: 0.  Flexion: 120.  Lachman: normal. Pivot Shift: normal.  Posterior drawer: normal.  Varus stability in full extension: normal.  Varus stability in 30 degrees of flexion: normal.  Valgus stability in full extension: normal.  Valgus stability in 30 degrees of flexion: normal.   Intra-operative findings: A thorough arthroscopic examination of the knee was performed.  The findings are: 1. Suprapatellar pouch: synovitic 2. Undersurface of median ridge: Grade 2-3 degenerative changes 3. Medial patellar facet: Grade 2-3 degenerative changes 4. Lateral patellar facet: Grade 1 degenerative changes 5. Trochlea: Grade 1-2 degenerative changes 6. Lateral gutter/popliteus tendon: Normal 7. Hoffa's fat pad: Inflamed 8. Medial gutter/plica: Normal 9. ACL: Normal 10. PCL: Normal 11. Medial meniscus: Complex tear with displaced fragment of the posterior horn adjacent to the meniscus root attachment into the intercondylar notch.  The meniscus root itself was intact.  Additionally there was a parrot-beak tear of the posterior horn/body junction with flipped fragment of the meniscus into the medial tibial recess.  The radial portion of this tear affected approximately 95%  of the meniscus width.  12. Medial compartment cartilage: Focal area on the posterior medial femoral condyle with grade 4 degenerative changes with surrounding diffuse grade 1-2 degenerative changes; medial tibial plateau with significant areas of grade 4 degenerative changes 13. Lateral meniscus: Normal, no tear of the anterior horn; approximately 6 mm loose body in the lateral compartment 14. Lateral compartment cartilage: Grade 1-2 degenerative changes to the tibial plateau and lateral femoral condyle   OPERATIVE REPORT:     I identified Megan Montgomery in the pre-operative holding area. I marked the operative knee with my initials. I reviewed the risks and benefits of the proposed surgical intervention  and the  patient (and/or patient's guardian) wished to proceed.  The patient underwent regional anesthesia in the preoperative holding area.  The patient was transferred to the operative suite and placed in the supine position with all bony prominences padded.  Anesthesia was administered. Appropriate IV antibiotics were administered prior to incision. The extremity was then prepped and draped in standard fashion. A time out was performed confirming the correct extremity, correct patient, and correct procedure.   Arthroscopy portals were marked. Local anesthetic was injected to the planned portal sites. The anterolateral portal was established with an 11 blade. The arthroscope was placed in the anterolateral portal and then into the suprapatellar pouch.  A diagnostic knee scope was completed with the above findings. The medial meniscus tear was identified.   Next the medial portal was established under needle localization.  The loose body from the lateral compartment was removed with an oscillating shaver. The MCL was pie-crusted to improve visualization of the posterior horn. The meniscal tear was debrided using an arthroscopic biter and an oscillating shaver until the meniscus had stable borders. A chondroplasty was performed of the medial compartment and patellofemoral compartment such that there were stable cartilage edges without any loose fragments of cartilage.   ArthroCare wand was used to perform a partial synovectomy of the patellofemoral compartment given the extensive synovitis in this region.  The arthroscope was withdrawn from the joint.   Using fluoroscopic guidance and correlation with the patient's MRI, a small stab incision was made over the medial tibial plateau.  A trocar with cannula was drilled into the site of the subchondral insufficiency fracture such that all of the flutes were within bone.  Calcium phosphate mixture was appropriately mixed and 4cc of calcium phosphate were injected through  the cannula into the medial tibial plateau subchondral insufficiency fracture.  Appropriate filling was seen on fluoroscopy.  The arthroscope was placed back into the joint.  There was no extravasation of the calcium phosphate material into the joint.  Cannula was left in place for approximately 8 minutes until calcium phosphate had appropriately set.  Local anesthetic was injected about the medial tibial plateau site and into the knee joint itself.   The incisions were closed with 3-0 Nylon suture. Sterile dressings included Xeroform, 4x4s, Sof-Rol, and Bias wrap. A Polarcare was placed.  The patient was then awakened and taken to the PACU hemodynamically stable without complication.     POSTOPERATIVE PLAN: The patient will be discharged home today once they meet PACU criteria. Aspirin 325 mg daily was prescribed for 2 weeks for DVT prophylaxis.  Physical therapy will start  in 3 to 7 days. Weight-bearing as tolerated. Follow up in 2 weeks per protocol.

## 2019-11-25 NOTE — Anesthesia Procedure Notes (Signed)
Procedure Name: LMA Insertion °Performed by: Bea Duren R, CRNA °Pre-anesthesia Checklist: Patient identified, Emergency Drugs available, Suction available and Patient being monitored °Patient Re-evaluated:Patient Re-evaluated prior to induction °Oxygen Delivery Method: Circle system utilized °Preoxygenation: Pre-oxygenation with 100% oxygen °Induction Type: IV induction °LMA: LMA inserted °LMA Size: 4.0 °Tube type: Oral °Number of attempts: 1 °Placement Confirmation: positive ETCO2 and breath sounds checked- equal and bilateral °Dental Injury: Teeth and Oropharynx as per pre-operative assessment  ° ° ° ° ° ° °

## 2019-11-26 ENCOUNTER — Encounter: Payer: Self-pay | Admitting: Orthopedic Surgery

## 2019-11-26 NOTE — Anesthesia Postprocedure Evaluation (Signed)
Anesthesia Post Note  Patient: Megan Montgomery  Procedure(s) Performed: Right knee arthroscopic partial medial meniscectomy with subchondroplasty of the medial tibial plateau. (Right Knee)  Patient location during evaluation: PACU Anesthesia Type: General Level of consciousness: awake and alert Pain management: pain level controlled Vital Signs Assessment: post-procedure vital signs reviewed and stable Respiratory status: spontaneous breathing, nonlabored ventilation and respiratory function stable Cardiovascular status: blood pressure returned to baseline and stable Postop Assessment: no apparent nausea or vomiting Anesthetic complications: no   No complications documented.   Last Vitals:  Vitals:   11/25/19 1333 11/25/19 1355  BP:  (!) 164/76  Pulse: 78 74  Resp: 18 18  Temp: 36.4 C   SpO2: 96% 99%    Last Pain:  Vitals:   11/26/19 0820  TempSrc:   PainSc: [place]

## 2019-12-01 ENCOUNTER — Emergency Department
Admission: EM | Admit: 2019-12-01 | Discharge: 2019-12-01 | Disposition: A | Discharge status: Home / Self Care | Payer: Medicare Other | Attending: Emergency Medicine | Admitting: Emergency Medicine

## 2019-12-01 ENCOUNTER — Other Ambulatory Visit: Payer: Self-pay

## 2019-12-01 ENCOUNTER — Encounter: Payer: Self-pay | Admitting: Emergency Medicine

## 2019-12-01 DIAGNOSIS — Z87891 Personal history of nicotine dependence: Secondary | ICD-10-CM | POA: Diagnosis not present

## 2019-12-01 DIAGNOSIS — L7622 Postprocedural hemorrhage and hematoma of skin and subcutaneous tissue following other procedure: Secondary | ICD-10-CM | POA: Diagnosis not present

## 2019-12-01 DIAGNOSIS — Z4801 Encounter for change or removal of surgical wound dressing: Secondary | ICD-10-CM | POA: Diagnosis present

## 2019-12-01 DIAGNOSIS — Z8541 Personal history of malignant neoplasm of cervix uteri: Secondary | ICD-10-CM | POA: Insufficient documentation

## 2019-12-01 DIAGNOSIS — I10 Essential (primary) hypertension: Secondary | ICD-10-CM | POA: Insufficient documentation

## 2019-12-01 DIAGNOSIS — Z79899 Other long term (current) drug therapy: Secondary | ICD-10-CM | POA: Diagnosis not present

## 2019-12-01 DIAGNOSIS — Z85828 Personal history of other malignant neoplasm of skin: Secondary | ICD-10-CM | POA: Diagnosis not present

## 2019-12-01 DIAGNOSIS — Z5189 Encounter for other specified aftercare: Secondary | ICD-10-CM

## 2019-12-01 NOTE — ED Triage Notes (Signed)
Pt in via POV, reports knee surgery on Monday, presents today due to bleeding coming from one of the incisions since last night.  Bleeding w/ clots noted to dressing.  Area appears normal otherwise; sutures remain intact, no redness/warmth to area.  Pt denies any excessive pain to area.

## 2019-12-01 NOTE — Discharge Instructions (Signed)
Your exam is normal at this time. The bleeding from your knee laceration is stable. No active bleeding. Keep the wound clean, dry, and covered. Follow-up with Dr. Posey Pronto as planned.

## 2019-12-01 NOTE — ED Provider Notes (Signed)
[facility] Emergency Department Provider Note ____________________________________________  Time seen: 1007  I have reviewed the triage vital signs and the nursing notes.  HISTORY  Chief Complaint  Post-op Problem  HPI Megan Montgomery is a 74 y.o. female who presents her self to the ED for evaluation of some bleeding from her incision site.  Patient is 6 days status post right knee arthroscopic surgery for medial meniscus tear.  She presents for evaluation of some bleeding coming from the central incision last night.  Patient describes bleeding with clots on the dressing that that she change.  She reports the sutures are intact, she denies any redness, warmth, or skin irritation.  She also denies any excessive pain to the area she is denying any interim fever, chills, or sweats.  She presents this morning with bleeding controlled, but the most recent nonstick dressing does have some dried blood intact.   Past Medical History:  Diagnosis Date  . Cervical cancer ([place]) 1988  . Degenerative disc disease, thoracic   . GERD (gastroesophageal reflux disease)   . Hypertension   . PAT (paroxysmal atrial tachycardia) ([place])   . Skin cancer    Neck    Patient Active Problem List   Diagnosis Date Noted  . Cellulitis 05/17/2017    Past Surgical History:  Procedure Laterality Date  . ABDOMINAL HYSTERECTOMY    . BREAST BIOPSY Right    neg bx/clip  . CATARACT EXTRACTION W/ INTRAOCULAR LENS  IMPLANT, BILATERAL Bilateral   . EYE SURGERY    . KNEE ARTHROSCOPY WITH SUBCHONDROPLASTY Right 11/25/2019   Procedure: Right knee arthroscopic partial medial meniscectomy with subchondroplasty of the medial tibial plateau.;  Surgeon: Leim Fabry, MD;  Location: [facility] ORS;  Service: Orthopedics;  Laterality: Right;  . OOPHORECTOMY Right   . 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 TONSILLECTOMY      Prior to Admission medications   Medication  Sig Start Date End Date Taking? Authorizing Provider  acetaminophen (TYLENOL) 500 MG tablet Take 2 tablets (1,000 mg total) by mouth every 8 (eight) hours. 11/25/19 11/24/20  Leim Fabry, MD  aspirin EC 325 MG tablet Take 1 tablet (325 mg total) by mouth daily for 14 days. 11/25/19 12/09/19  Leim Fabry, MD  calcium-vitamin D (OSCAL WITH D) 500-200 MG-UNIT TABS tablet Take 2 tablets by mouth daily.  11/11/16   [provider]  diphenhydrAMINE (BENADRYL) 25 MG tablet Take 25 mg by mouth every 6 (six) hours as needed for itching.    [provider]  hydrochlorothiazide (HYDRODIURIL) 25 MG tablet Take 25 mg by mouth daily.  02/22/17   [provider]  HYDROcodone-acetaminophen (NORCO) 5-325 MG tablet Take 1-2 tablets by mouth every 4 (four) hours as needed for moderate pain or severe pain. 11/25/19   Leim Fabry, MD  hydrOXYzine (ATARAX/VISTARIL) 10 MG tablet Take 10-20 mg by mouth daily as needed for itching.    [provider]  ibandronate (BONIVA) 150 MG tablet Take 150 mg by mouth every 30 (thirty) days.  02/22/17   [provider]  latanoprost (XALATAN) 0.005 % ophthalmic solution Place 1 drop into the right eye at bedtime. 11/14/19   [provider]  lisinopril (PRINIVIL,ZESTRIL) 20 MG tablet Take 20 mg by mouth daily.  02/22/17   [provider]  Melatonin 5 MG TABS Take 5 mg by mouth at bedtime.     [provider]  meloxicam (MOBIC) 7.5 MG tablet  Take 7.5 mg by mouth 2 (two) times daily.    [provider]  Misc Natural Products (GLUCOSAMINE CHOND MSM FORMULA) TABS Take 1 tablet by mouth daily.    [provider]  Multiple Vitamins-Minerals (CENTRUM SILVER 50+WOMEN) TABS Take 1 tablet by mouth daily.    [provider]  omeprazole (PRILOSEC) 40 MG capsule Take 40 mg by mouth daily.  03/23/17   [provider]  ondansetron (ZOFRAN ODT) 4 MG disintegrating tablet Take 1 tablet (4 mg total) by  mouth every 8 (eight) hours as needed for nausea or vomiting. 11/25/19   Leim Fabry, MD  Propylene Glycol (SYSTANE COMPLETE) 0.6 % SOLN Place 1 drop into both eyes 2 (two) times daily.    [provider]  Simethicone (GAS-X PO) Take 1 tablet by mouth daily as needed (gas).    [provider]  simvastatin (ZOCOR) 10 MG tablet Take 10 mg by mouth at bedtime.  03/08/17   [provider]    Allergies Bee venom, Ivp dye [iodinated diagnostic agents], Penicillins, and Septra [sulfamethoxazole-trimethoprim]  Family History  Problem Relation Age of Onset  . Breast cancer Mother 67  . Kidney cancer Mother   . Lung cancer Father   . Prostate cancer Father     Social History Social History   Tobacco Use  . Smoking status: Former Smoker    Types: Cigarettes    Quit date: 1992    Years since quitting: 29.5  . Smokeless tobacco: Never Used  Vaping Use  . Vaping Use: Never used  Substance Use Topics  . Alcohol use: Yes  . Drug use: No    Review of Systems  Constitutional: Negative for fever. Cardiovascular: Negative for chest pain. Respiratory: Negative for shortness of breath. Gastrointestinal: Negative for abdominal pain, vomiting and diarrhea. Musculoskeletal: Negative for back pain.  Surgical wound bleeding. Skin: Negative for rash. Neurological: Negative for headaches, focal weakness or numbness. ____________________________________________  PHYSICAL EXAM:  VITAL SIGNS: ED Triage Vitals  Enc Vitals Group     BP 12/01/19 0920 (!) 159/87     Pulse Rate 12/01/19 0920 87     Resp 12/01/19 0920 16     Temp 12/01/19 0920 99.1 F (37.3 C)     Temp Source 12/01/19 0920 Oral     SpO2 12/01/19 0920 98 %     Weight 12/01/19 0921 170 lb (77.1 kg)     Height 12/01/19 0921 5\' 7"  (1.702 m)     Head Circumference --      Peak Flow --      Pain Score 12/01/19 0920 2     Pain Loc --      Pain Edu? --      Excl. in [place]? --     Constitutional: Alert and  oriented. Well appearing and in no distress. Head: Normocephalic and atraumatic. Eyes: Conjunctivae are normal. Normal extraocular movements Cardiovascular: Normal rate, regular rhythm. Normal distal pulses. Respiratory: Normal respiratory effort.  Musculoskeletal: Right knee with 3 surgical incisions consistent with recent arthroscopic surgery.  The wound edges are well approximated for the 2 lateral incisions.  The central incision has central scab noted to the 1 cm laceration.  No Bleeding is appreciated.  No weeping or purulence is noted.  Nontender with normal range of motion in all extremities.  Neurologic:  Normal gross sensation.  Normal speech and language. No gross focal neurologic deficits are appreciated. Skin:  Skin is warm, dry and intact. No rash noted.  ____________________________________________  PROCEDURES  Wound care Dressing change Procedures ____________________________________________  INITIAL IMPRESSION / ASSESSMENT AND PLAN / ED COURSE  Patient with ED evaluation of of some concern for postop bleeding from the incision related to her recent arthroscopic surgery.  She is presenting today with no active bleeding to the central incision.  The wound is healing well with a central scab in place.  The area is cleansed with alcohol, and Steri-Strips are replaced.  Ace bandage and nonstick dressing also applied.  Patient will follow up with Ortho tomorrow as scheduled, along with her initial physical therapy appointment.  Return precautions have been discussed.  Hser Belanger was evaluated in Emergency Department on 12/01/2019 for the symptoms described in the history of present illness. She was evaluated in the context of the global COVID-19 pandemic, which necessitated consideration that the patient might be at risk for infection with the SARS-CoV-2 virus that causes COVID-19. Institutional protocols and algorithms that pertain to the evaluation of patients at risk for COVID-19  are in a state of rapid change based on information released by regulatory bodies including the CDC and federal and state organizations. These policies and algorithms were followed during the patient's care in the ED. ____________________________________________  FINAL CLINICAL IMPRESSION(S) / ED DIAGNOSES  Final diagnoses:  Visit for wound check      Melvenia Needles, PA-C 12/01/19 1212    Carrie Mew, MD 12/01/19 1339

## 2019-12-09 ENCOUNTER — Ambulatory Visit
Admission: RE | Admit: 2019-12-09 | Discharge: 2019-12-09 | Disposition: A | Discharge status: Home / Self Care | Payer: Medicare Other | Source: Ambulatory Visit | Attending: Family Medicine | Admitting: Family Medicine

## 2019-12-09 ENCOUNTER — Other Ambulatory Visit: Payer: Self-pay | Admitting: Family Medicine

## 2019-12-09 ENCOUNTER — Other Ambulatory Visit: Payer: Self-pay

## 2019-12-09 DIAGNOSIS — R600 Localized edema: Secondary | ICD-10-CM | POA: Insufficient documentation

## 2019-12-09 IMAGING — US US EXTREM LOW VENOUS*R*
1 series · 13 of 24 positions shown · non-contrast
Comparison: None.

CLINICAL DATA: Right leg pain and swelling following recent knee
surgery



[Series 1: us extrem low venous*right* · 0.07mm/px · 13 of 33 slices shown]
[im 1/33]
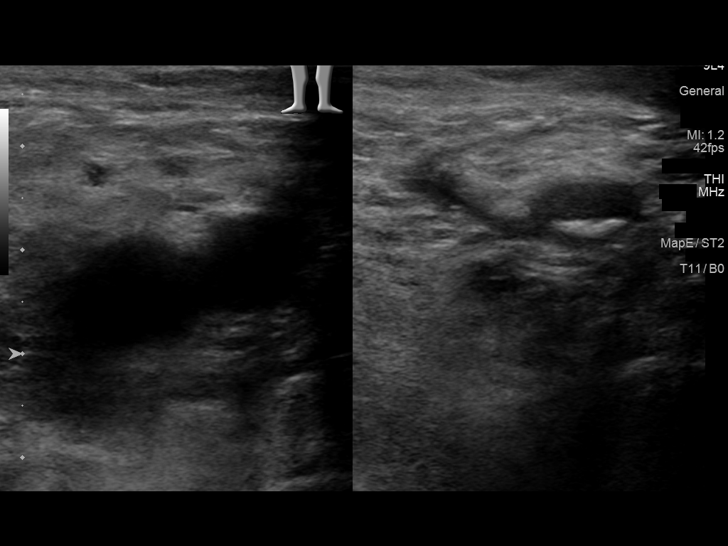
[im 3/33]
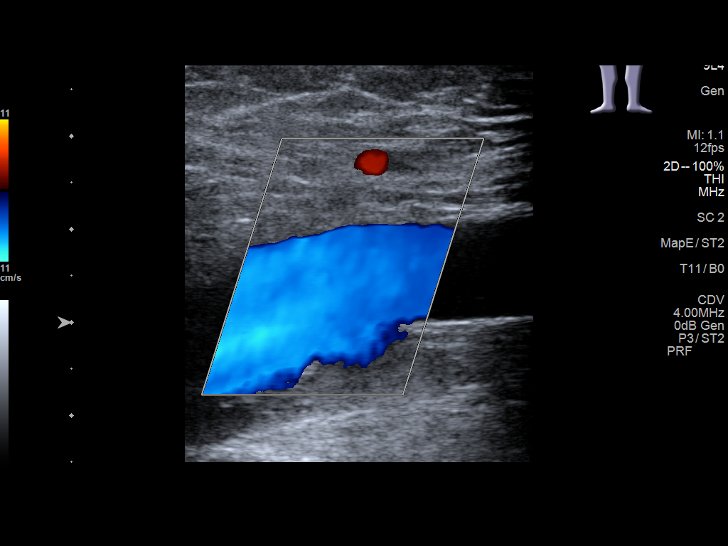
[im 6/33]
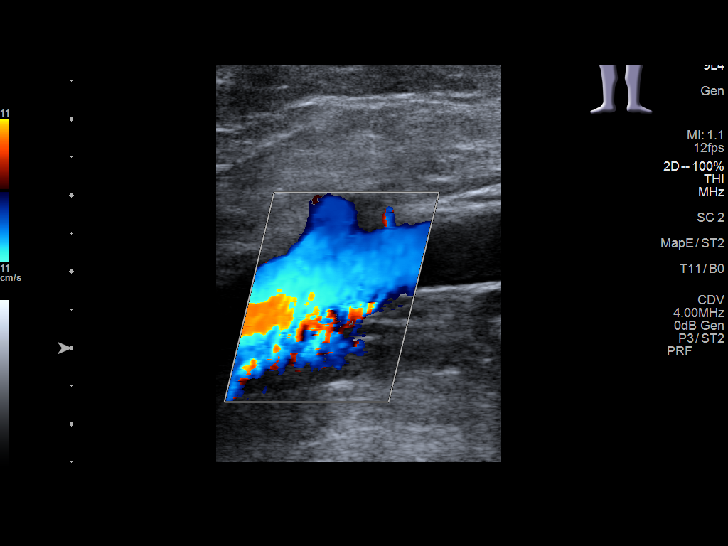
[im 9/33]
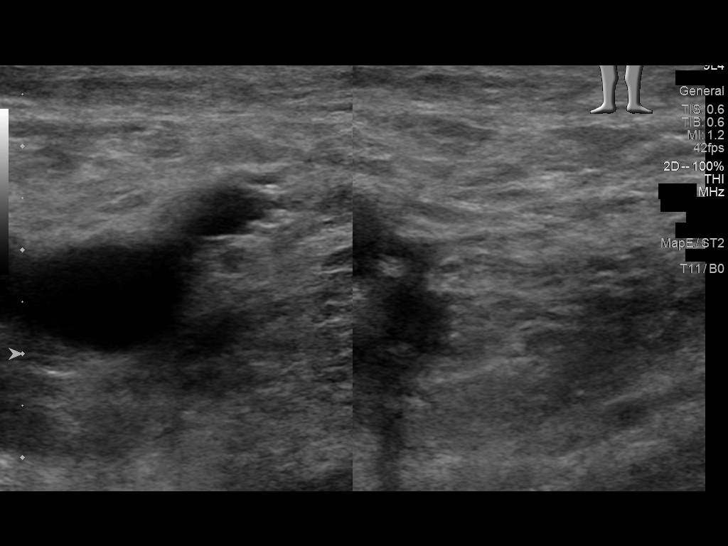
[im 12/33]
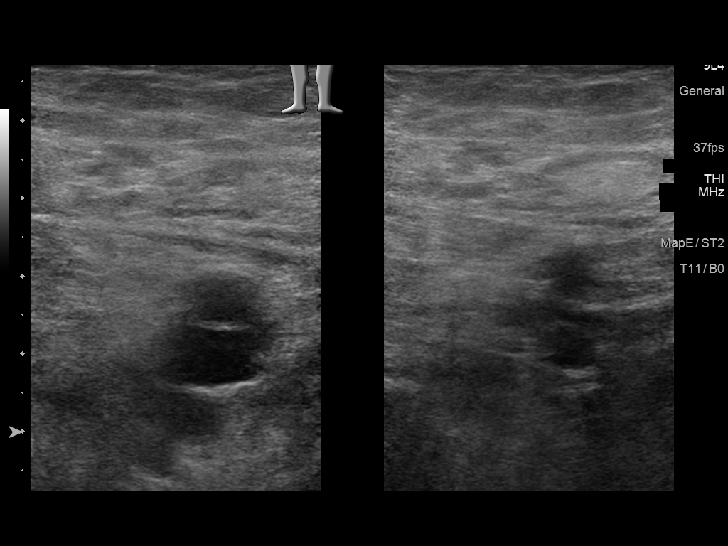
[im 14/33]
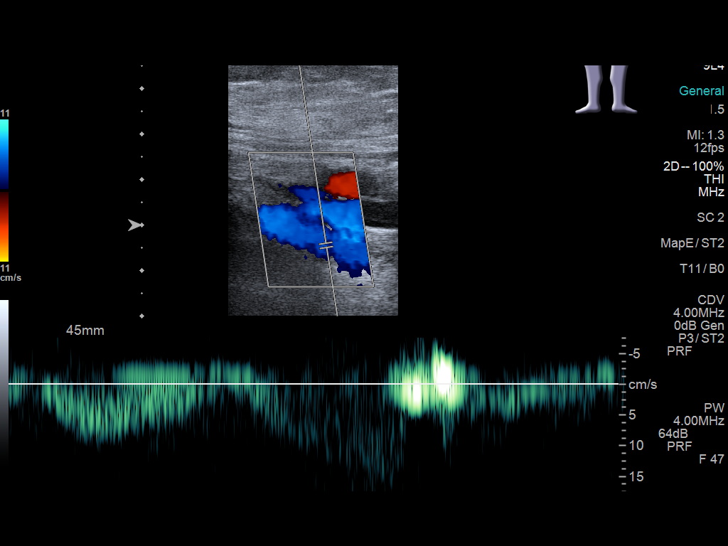
[im 17/33]
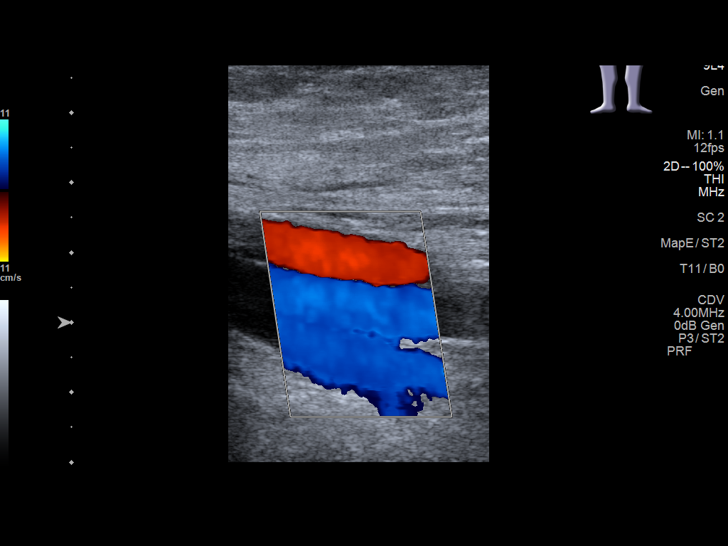
[im 19/33]
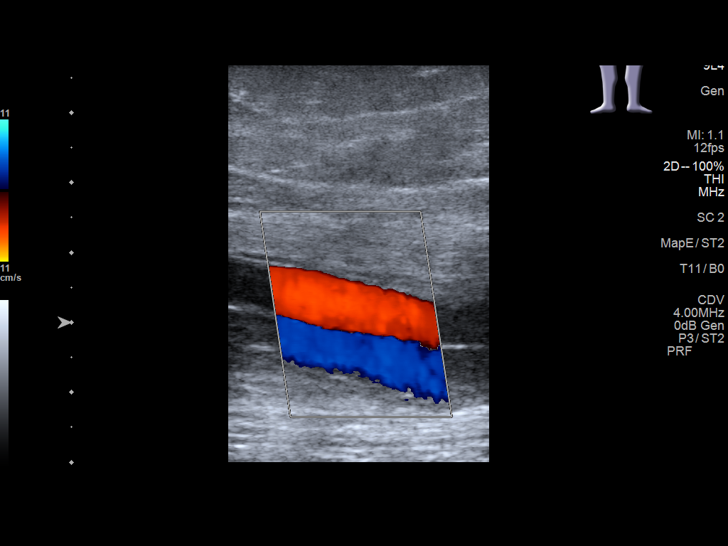
[im 21/33]
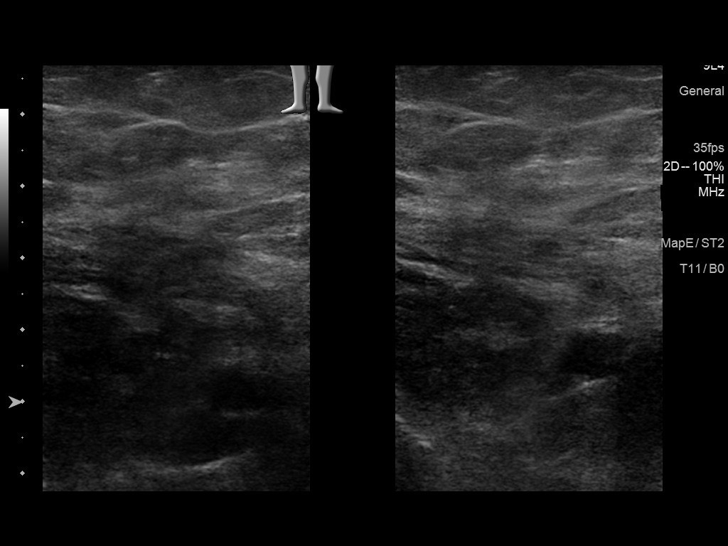
[im 24/33]
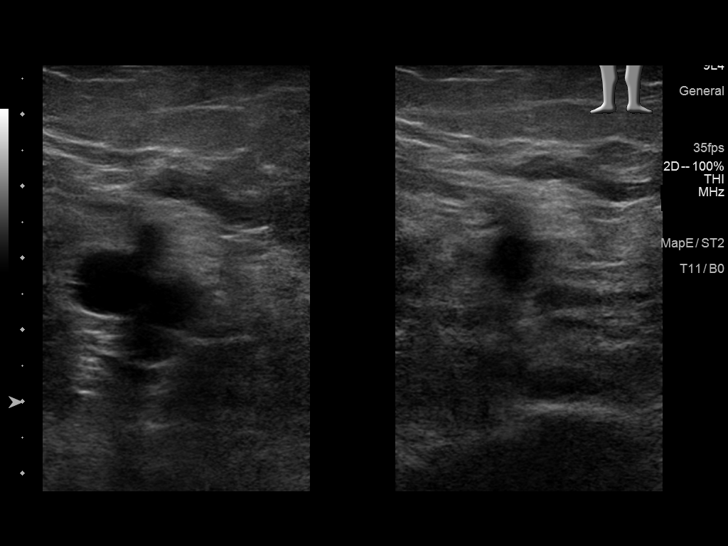
[im 27/33]
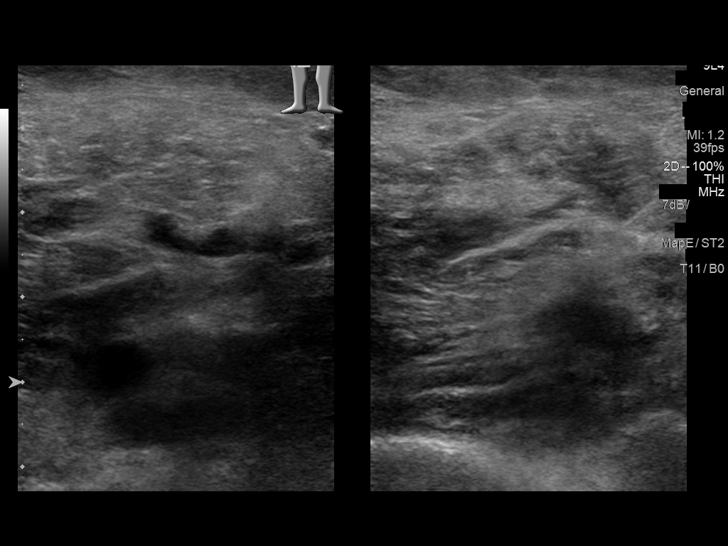
[im 30/33]
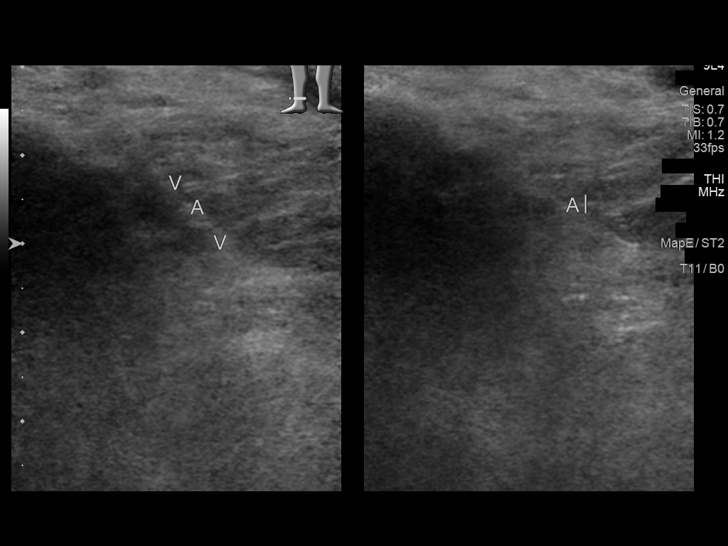
[im 33/33]
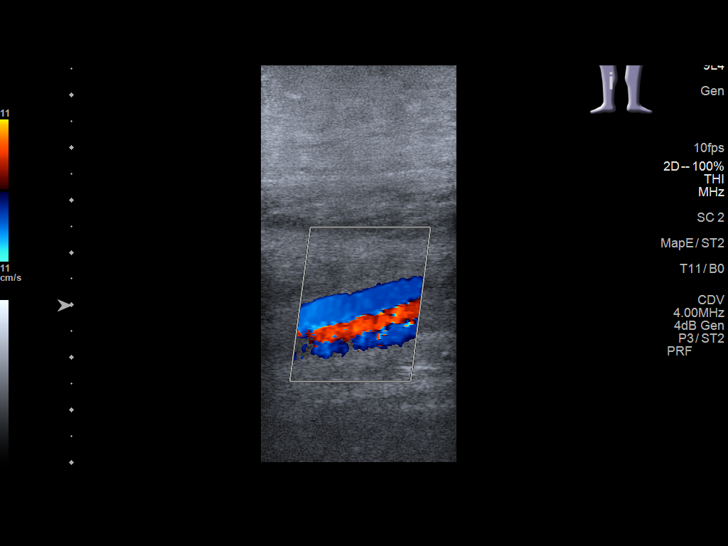

[13 of 24 positions shown; findings below may reference images not displayed]

FINDINGS: Contralateral Common Femoral Vein: Respiratory phasicity is normal
and symmetric with the symptomatic side. No evidence of thrombus.
Normal compressibility.

Common Femoral Vein: No evidence of thrombus. Normal
compressibility, respiratory phasicity and response to augmentation.

Saphenofemoral Junction: No evidence of thrombus. Normal
compressibility and flow on color Doppler imaging.

Profunda Femoral Vein: No evidence of thrombus. Normal
compressibility and flow on color Doppler imaging.

Femoral Vein: No evidence of thrombus. Normal compressibility,
respiratory phasicity and response to augmentation.

Popliteal Vein: No evidence of thrombus. Normal compressibility,
respiratory phasicity and response to augmentation.

Calf Veins: No evidence of thrombus. Normal compressibility and flow
on color Doppler imaging.

Superficial Great Saphenous Vein: No evidence of thrombus. Normal
compressibility.

Venous Reflux:  None.

Other Findings:  None.
IMPRESSION: No evidence of deep venous thrombosis.

## 2019-12-24 ENCOUNTER — Ambulatory Visit
Admission: EM | Admit: 2019-12-24 | Discharge: 2019-12-24 | Disposition: A | Discharge status: Home / Self Care | Payer: Medicare Other | Attending: Emergency Medicine | Admitting: Emergency Medicine

## 2019-12-24 ENCOUNTER — Other Ambulatory Visit: Payer: Self-pay

## 2019-12-24 ENCOUNTER — Encounter: Payer: Self-pay | Admitting: Emergency Medicine

## 2019-12-24 DIAGNOSIS — H01001 Unspecified blepharitis right upper eyelid: Secondary | ICD-10-CM

## 2019-12-24 MED ORDER — ERYTHROMYCIN 5 MG/GM OP OINT
TOPICAL_OINTMENT | OPHTHALMIC | 0 refills | Status: DC
Start: 2019-12-24 — End: 2022-01-19

## 2019-12-24 MED ORDER — TETRACAINE HCL 0.5 % OP SOLN
2.0000 [drp] | Freq: Once | OPHTHALMIC | Status: AC
  Administered 2019-12-24: 2 [drp] via OPHTHALMIC

## 2019-12-24 MED ORDER — HYDROCORTISONE 2.5 % EX OINT
TOPICAL_OINTMENT | Freq: Two times a day (BID) | CUTANEOUS | 0 refills | Status: AC | PRN
Start: 2019-12-24 — End: 2019-12-31

## 2019-12-24 MED ORDER — FLUORESCEIN SODIUM 1 MG OP STRP
1.0000 | ORAL_STRIP | Freq: Once | OPHTHALMIC | Status: AC
  Administered 2019-12-24: 1 via OPHTHALMIC

## 2019-12-24 NOTE — Discharge Instructions (Addendum)
I suspect that this is blepharitis.  Cool compresses, lid hygiene with diluted baby shampoo once daily as we discussed.  Erythromycin ointment for secondary infection, hydrocortisone ointment for swelling.

## 2019-12-24 NOTE — ED Provider Notes (Signed)
HPI  SUBJECTIVE:  Megan Montgomery is a 74 y.o. female who presents with right upper eyelid edema starting last night.  She reports mild itching.  No pain, induration.  No visual changes, increased tearing, discharge, conjunctival injection, allergy symptoms.  No pain with extraocular movements, photophobia.  No periorbital erythema, edema.  No known chemical exposure.  No facial rash.  No recent change in her medications.  States that she uses Systane and latanoprost eyedrops daily.  Has been using this for a long time.  She has never had symptoms like this before.  She has not tried anything for this.  There are no aggravating or alleviating factors.  She has a past medical history of cataract surgery, chalazions, hypertension.  No history of allergic conjunctivitis, allergies, diabetes.  PPI:RJJOAC, Guy Begin, MD Ophthalmology: [facility]     Past Medical History:  Diagnosis Date  . Cervical cancer ([place]) 1988  . Degenerative disc disease, thoracic   . GERD (gastroesophageal reflux disease)   . Hypertension   . PAT (paroxysmal atrial tachycardia) ([place])   . Skin cancer    Neck    Past Surgical History:  Procedure Laterality Date  . ABDOMINAL HYSTERECTOMY    . BREAST BIOPSY Right    neg bx/clip  . CATARACT EXTRACTION W/ INTRAOCULAR LENS  IMPLANT, BILATERAL Bilateral   . EYE SURGERY    . KNEE ARTHROSCOPY WITH SUBCHONDROPLASTY Right 11/25/2019   Procedure: Right knee arthroscopic partial medial meniscectomy with subchondroplasty of the medial tibial plateau.;  Surgeon: Leim Fabry, MD;  Location: [facility] ORS;  Service: Orthopedics;  Laterality: Right;  . OOPHORECTOMY Right   . 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 TONSILLECTOMY      Family History  Problem Relation Age of Onset  . Breast cancer Mother 83  . Kidney cancer Mother   . Lung cancer Father   . Prostate cancer Father     Social History   Tobacco Use  . Smoking  status: Former Smoker    Types: Cigarettes    Quit date: 1992    Years since quitting: 29.6  . Smokeless tobacco: Never Used  Vaping Use  . Vaping Use: Never used  Substance Use Topics  . Alcohol use: Yes    Alcohol/week: 7.0 standard drinks    Types: 7 Shots of liquor per week  . Drug use: No    No current facility-administered medications for this encounter.  Current Outpatient Medications:  .  acetaminophen (TYLENOL) 500 MG tablet, Take 2 tablets (1,000 mg total) by mouth every 8 (eight) hours., Disp: 90 tablet, Rfl: 2 .  calcium-vitamin D (OSCAL WITH D) 500-200 MG-UNIT TABS tablet, Take 2 tablets by mouth daily. , Disp: , Rfl:  .  diphenhydrAMINE (BENADRYL) 25 MG tablet, Take 25 mg by mouth every 6 (six) hours as needed for itching., Disp: , Rfl:  .  hydrochlorothiazide (HYDRODIURIL) 25 MG tablet, Take 25 mg by mouth daily. , Disp: , Rfl:  .  ibandronate (BONIVA) 150 MG tablet, Take 150 mg by mouth every 30 (thirty) days. , Disp: , Rfl:  .  latanoprost (XALATAN) 0.005 % ophthalmic solution, Place 1 drop into the right eye at bedtime., Disp: , Rfl:  .  lisinopril (PRINIVIL,ZESTRIL) 20 MG tablet, Take 20 mg by mouth daily. , Disp: , Rfl:  .  Melatonin 5 MG TABS, Take 5 mg by mouth at bedtime. , Disp: , Rfl:  .  meloxicam (MOBIC)  7.5 MG tablet, Take 7.5 mg by mouth 2 (two) times daily., Disp: , Rfl:  .  Misc Natural Products (GLUCOSAMINE CHOND MSM FORMULA) TABS, Take 1 tablet by mouth daily., Disp: , Rfl:  .  Multiple Vitamins-Minerals (CENTRUM SILVER 50+WOMEN) TABS, Take 1 tablet by mouth daily., Disp: , Rfl:  .  omeprazole (PRILOSEC) 40 MG capsule, Take 40 mg by mouth daily. , Disp: , Rfl:  .  Propylene Glycol (SYSTANE COMPLETE) 0.6 % SOLN, Place 1 drop into both eyes 2 (two) times daily., Disp: , Rfl:  .  Simethicone (GAS-X PO), Take 1 tablet by mouth daily as needed (gas)., Disp: , Rfl:  .  simvastatin (ZOCOR) 10 MG tablet, Take 10 mg by mouth at bedtime. , Disp: , Rfl:  .   erythromycin ophthalmic ointment, 1 cm ribbon to affected eyelid qid x 10 days, Disp: 5 g, Rfl: 0 .  hydrocortisone 2.5 % ointment, Apply topically 2 (two) times daily as needed for up to 7 days., Disp: 20 g, Rfl: 0  Allergies  Allergen Reactions  . Bee Venom Swelling  . Ivp Dye [Iodinated Diagnostic Agents] Rash  . Penicillins Rash  . Septra [Sulfamethoxazole-Trimethoprim] Rash     ROS  As noted in HPI.   Physical Exam  BP (!) 157/78 (BP Location: Left Arm)   Pulse 71   Temp 98.1 F (36.7 C) (Oral)   Resp 18   Ht 5\' 7"  (1.702 m)   Wt 81.6 kg   SpO2 100%   BMI 28.19 kg/m   Constitutional: Well developed, well nourished, no acute distress Eyes: PERRLA, EOMI, no pain with EOMs, conjunctiva normal bilaterally, no discharge.    No direct or consensual photophobia.  Positive debris at the base of the upper right eyelid-blepharitis?  No incoming chalazion or stye noted. Positive soft edema upper eyelid.  Mild erythema.  No tenderness. No periorbital erythema, edema, tenderness.         Visual Acuity  Right Eye Distance: 20/25 (corrected) Left Eye Distance: 20/25 (corrected) Bilateral Distance: 20/20 (corrected)  Right Eye Near:   Left Eye Near:    Bilateral Near:    HENT: Normocephalic, atraumatic,mucus membranes moist Respiratory: Normal inspiratory effort Cardiovascular: Normal rate GI: nondistended skin: No rash, skin intact Musculoskeletal: no deformities Neurologic: Alert & oriented x 3, no focal neuro deficits Psychiatric: Speech and behavior appropriate   ED Course   Medications  tetracaine (PONTOCAINE) 0.5 % ophthalmic solution 2 drop (2 drops Right Eye Given 12/24/19 1237)  fluorescein ophthalmic strip 1 strip (1 strip Right Eye Given 12/24/19 1238)    No orders of the defined types were placed in this encounter.   No results found for this or any previous visit (from the past 24 hour(s)). No results found.  ED Clinical Impression  1.  Blepharitis of right upper eyelid, unspecified type      ED Assessment/Plan  Do not think that this is pre or post septal cellulitis.  I wonder if this is blepharitis or an allergic reaction to something.  Will send home with cool compresses, lid hygiene, erythromycin ointment, hydrocortisone ointment.  Follow-up with her ophthalmologist [facility] in [place] in several days if not getting any better.  To the ER if she gets worse.   Discussed  MDM, treatment plan, and plan for follow-up with patient. Discussed sn/sx that should prompt return to the ED. patient agrees with plan.   Meds ordered this encounter  Medications  . tetracaine (PONTOCAINE) 0.5 %  ophthalmic solution 2 drop  . fluorescein ophthalmic strip 1 strip  . erythromycin ophthalmic ointment    Sig: 1 cm ribbon to affected eyelid qid x 10 days    Dispense:  5 g    Refill:  0  . hydrocortisone 2.5 % ointment    Sig: Apply topically 2 (two) times daily as needed for up to 7 days.    Dispense:  20 g    Refill:  0    *This clinic note was created using [employer]. Therefore, there may be occasional mistakes despite careful proofreading.   ?    Melynda Ripple, MD 12/24/19 1252

## 2019-12-24 NOTE — ED Triage Notes (Signed)
Patient in today c/o right eye redness, swelling and itching x 1 day. Patient has not used any OTC medications.

## 2020-09-01 ENCOUNTER — Other Ambulatory Visit: Payer: Self-pay | Admitting: Family Medicine

## 2020-09-07 ENCOUNTER — Other Ambulatory Visit: Payer: Self-pay | Admitting: Family Medicine

## 2020-09-07 DIAGNOSIS — N644 Mastodynia: Secondary | ICD-10-CM

## 2020-09-10 ENCOUNTER — Ambulatory Visit
Admission: RE | Admit: 2020-09-10 | Discharge: 2020-09-10 | Disposition: A | Discharge status: Home / Self Care | Payer: Medicare Other | Source: Ambulatory Visit | Attending: Family Medicine | Admitting: Family Medicine

## 2020-09-10 ENCOUNTER — Other Ambulatory Visit: Payer: Self-pay

## 2020-09-10 DIAGNOSIS — N644 Mastodynia: Secondary | ICD-10-CM

## 2020-09-10 IMAGING — US US BREAST*L* LIMITED INC AXILLA
1 series · 5 of 5 positions shown · non-contrast
Comparison: Previous exam(s).

CLINICAL DATA: 74-year-old female presenting for evaluation of 1
year of left nipple tenderness with more recent fullness behind the
left nipple.

EXAM:
DIGITAL DIAGNOSTIC BILATERAL MAMMOGRAM WITH TOMOSYNTHESIS AND CAD;
ULTRASOUND LEFT BREAST LIMITED
TECHNIQUE: Bilateral digital diagnostic mammography and breast tomosynthesis
was performed. The images were evaluated with computer-aided
detection.; Targeted ultrasound examination of the left breast was
performed

[Series 1: us breast*left* limited inc axilla · 0.08mm/px · 5 of 5 slices shown]
[im 1/5]
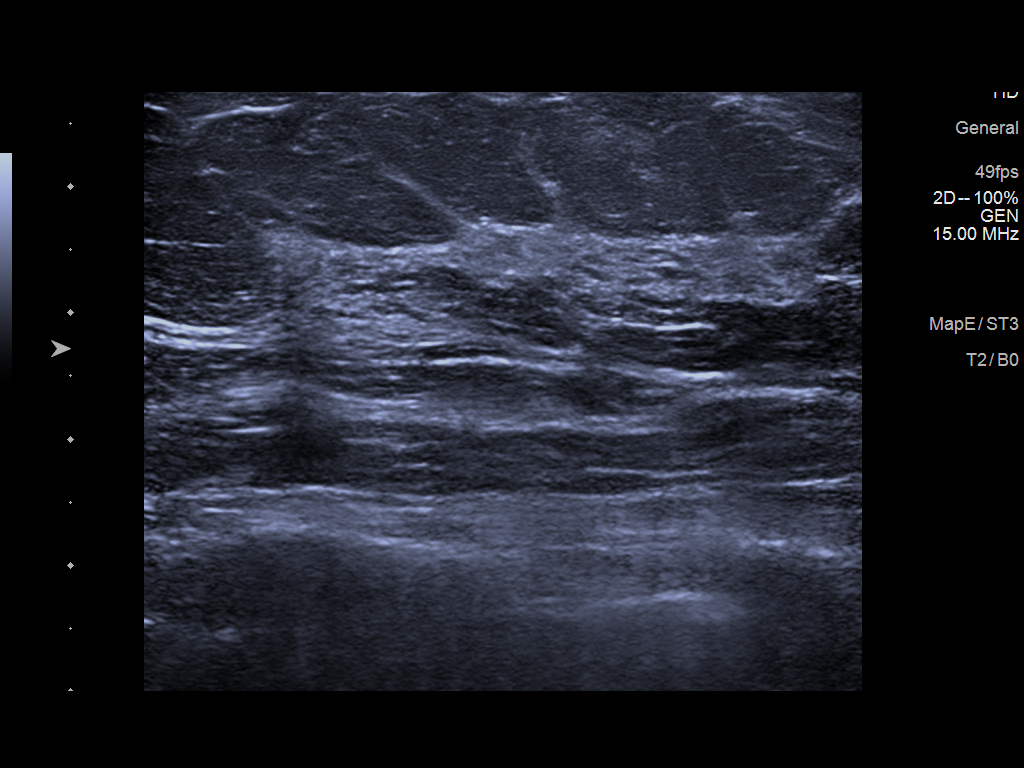
[im 2/5]
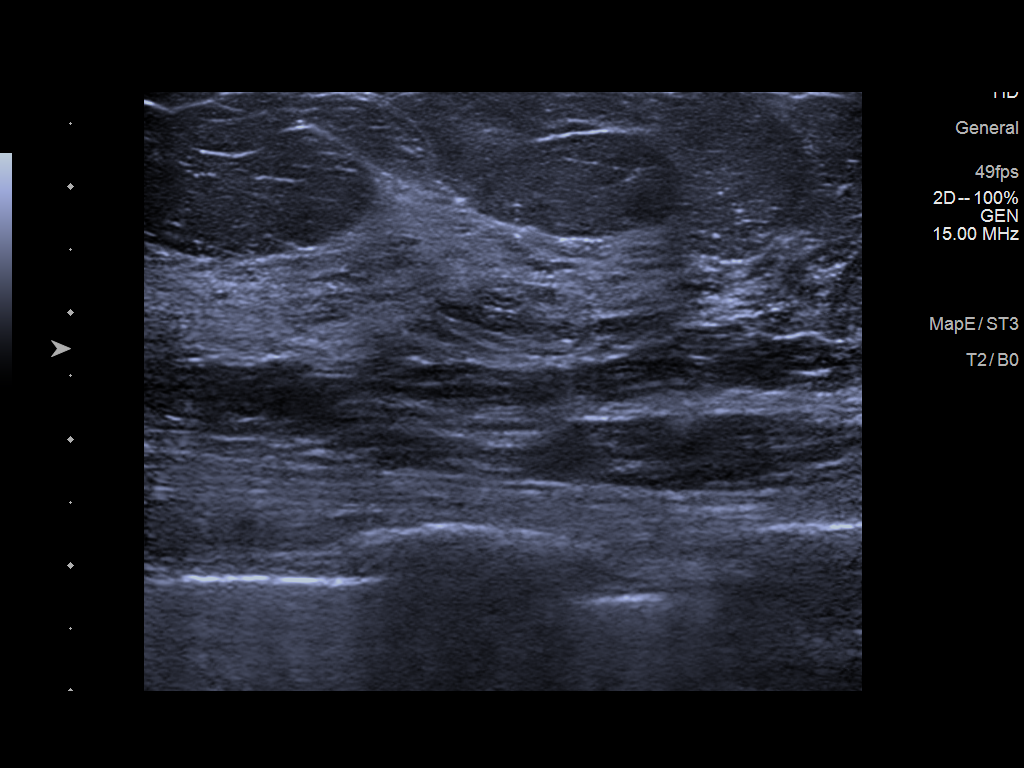
[im 3/5]
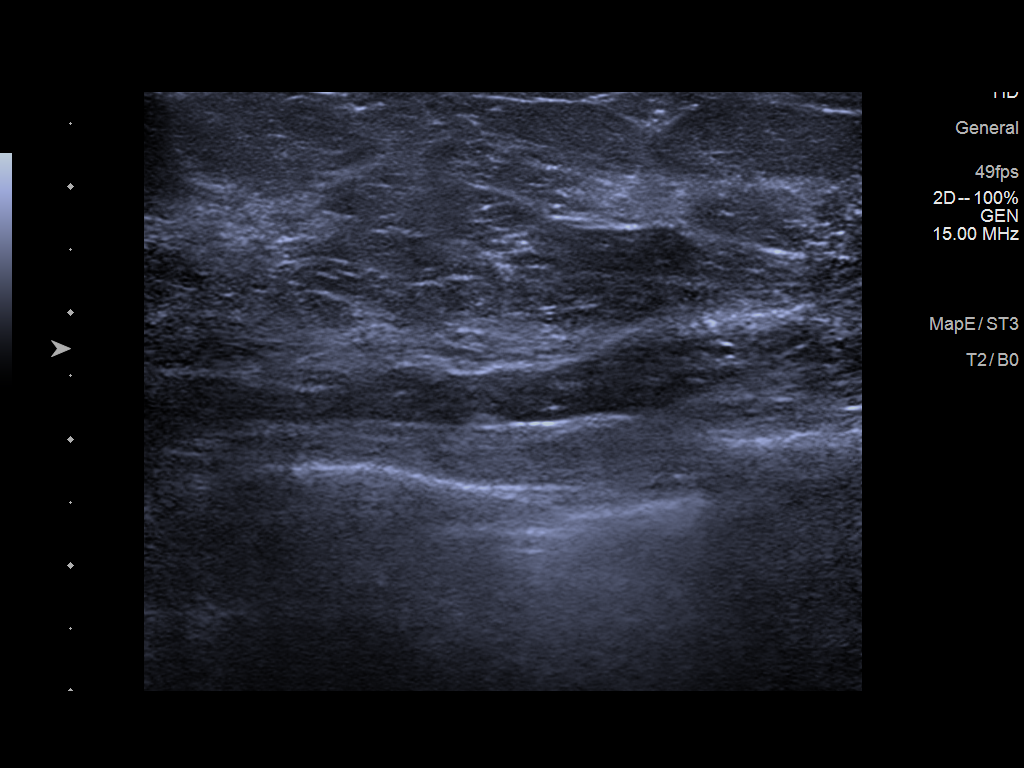
[im 4/5]
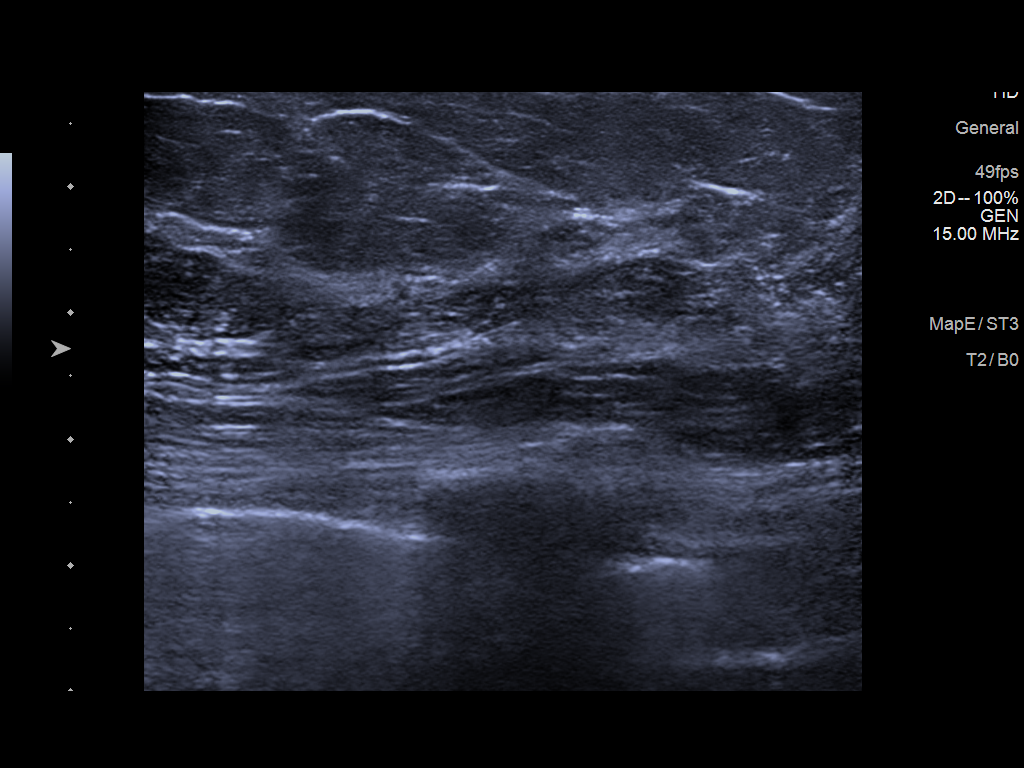
[im 5/5]
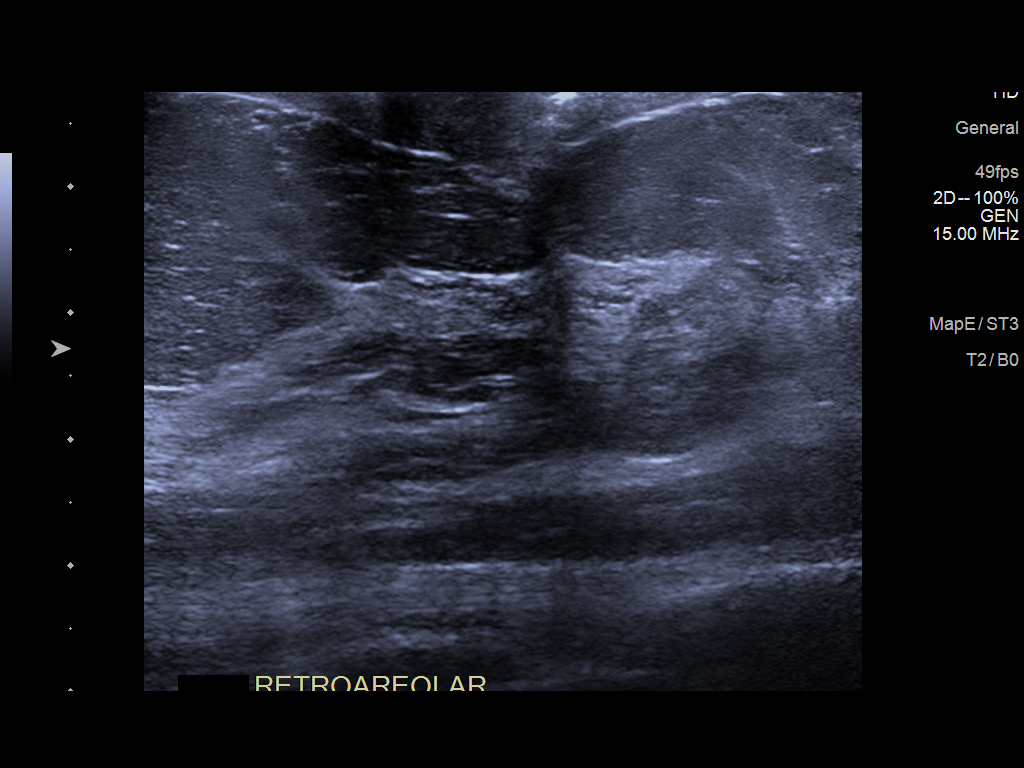

[5 of 5 positions shown; findings below may reference images not displayed]

ACR Breast Density Category b: There are scattered areas of
fibroglandular density.
FINDINGS: No suspicious calcifications, masses or areas of distortion are seen
in the bilateral breasts. No abnormal findings are seen on the spot
compression tomosynthesis images of the retroareolar left breast.

Ultrasound targeted to the retroareolar left breast demonstrates
normal fibroglandular tissue. No suspicious masses or areas of
shadowing are identified.
IMPRESSION: 1. There are no suspicious mammographic or targeted sonographic
abnormalities in the retroareolar left breast.

2.  No mammographic evidence of malignancy in the bilateral breasts.

RECOMMENDATION:
1. Clinical follow-up recommended for the left nipple pain. Any
further workup should be based on clinical grounds.

2.  Screening mammogram in one year.(Code:[5C])

I have discussed the findings and recommendations with the patient.
If applicable, a reminder letter will be sent to the patient
regarding the next appointment.

BI-RADS CATEGORY  2: Benign.

## 2020-09-10 IMAGING — MG DIGITAL DIAGNOSTIC BILAT W/ TOMO W/ CAD
6 of 10 series · 6 of 30 positions shown · non-contrast
Comparison: Previous exam(s).

CLINICAL DATA: 74-year-old female presenting for evaluation of 1
year of left nipple tenderness with more recent fullness behind the
left nipple.

EXAM:
DIGITAL DIAGNOSTIC BILATERAL MAMMOGRAM WITH TOMOSYNTHESIS AND CAD;
ULTRASOUND LEFT BREAST LIMITED
TECHNIQUE: Bilateral digital diagnostic mammography and breast tomosynthesis
was performed. The images were evaluated with computer-aided
detection.; Targeted ultrasound examination of the left breast was
performed

[L MLO synth-2D (1 of 2)]
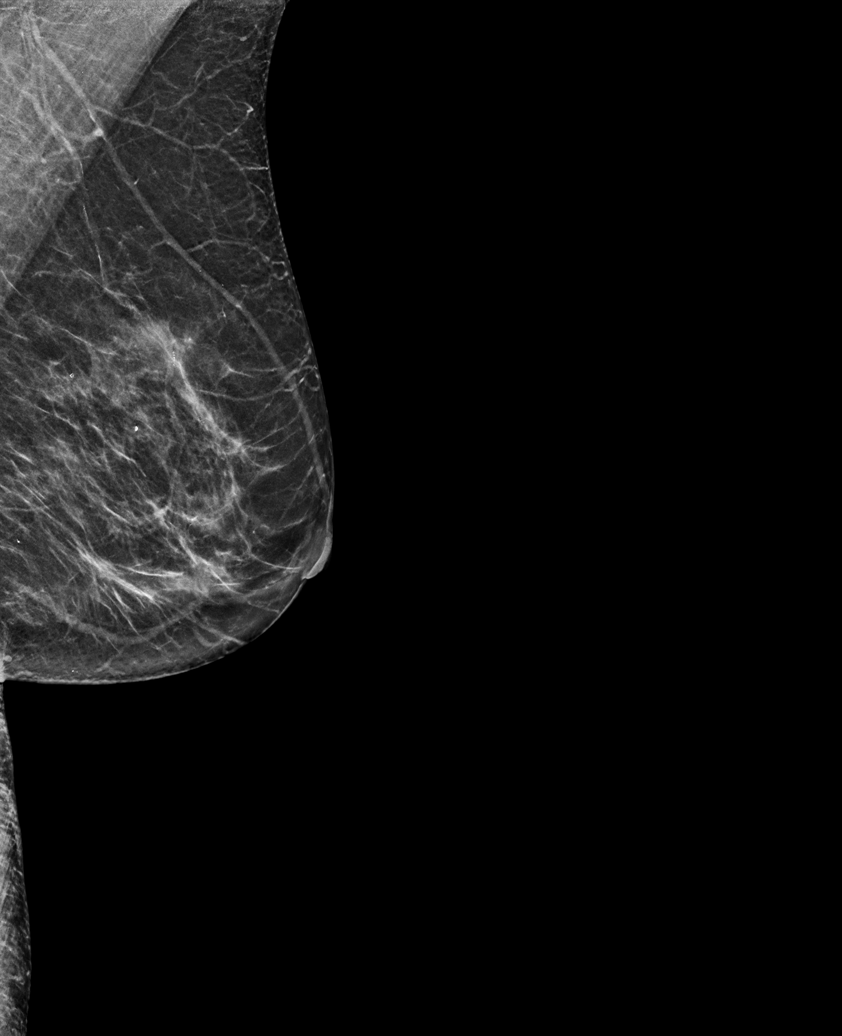

[R CC synth-2D]
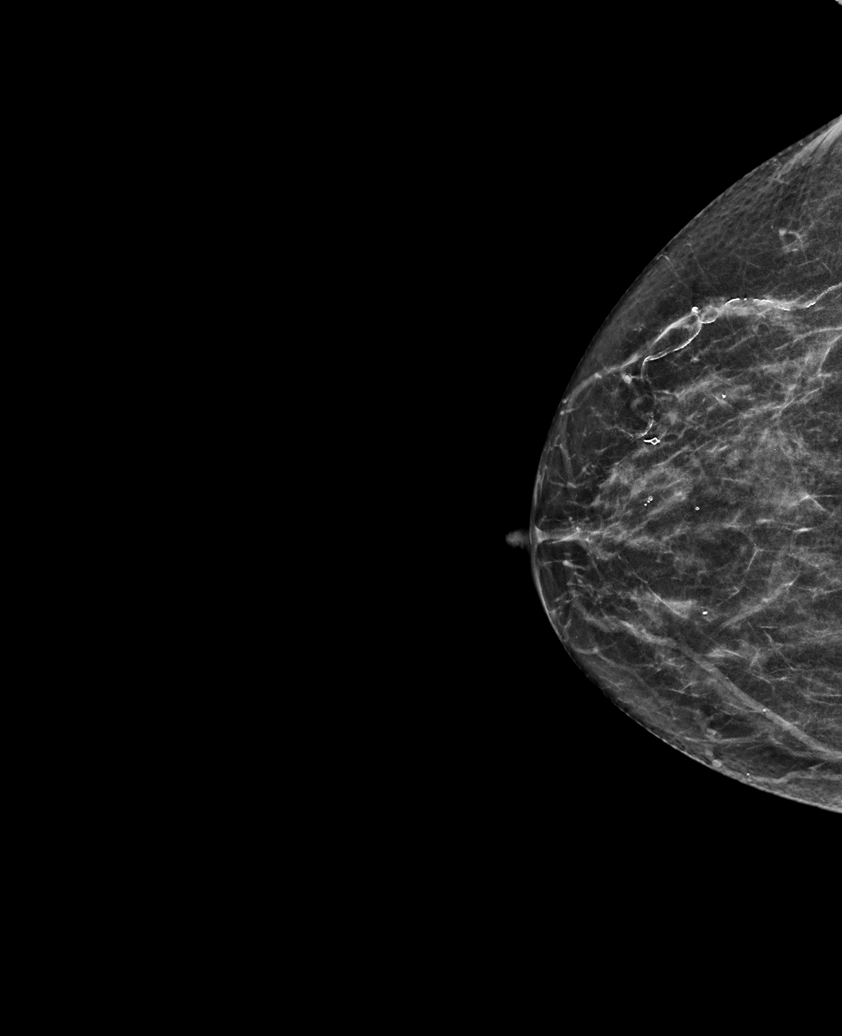

[L MLO synth-2D (2 of 2)]
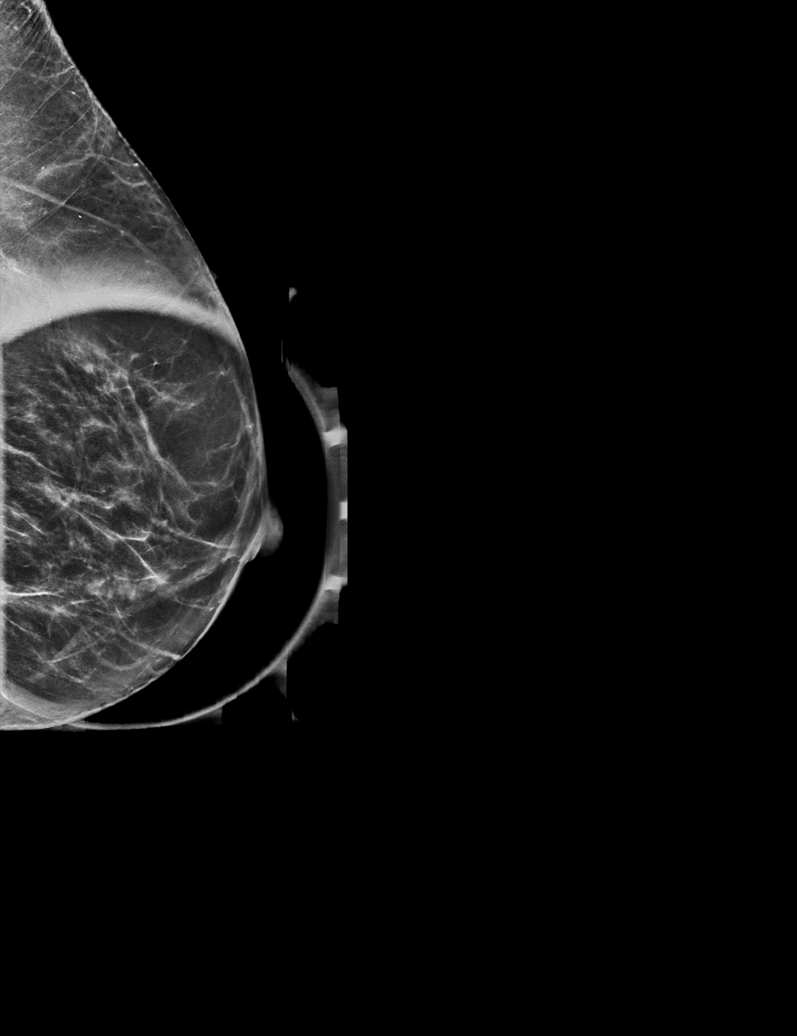

[L CC synth-2D]
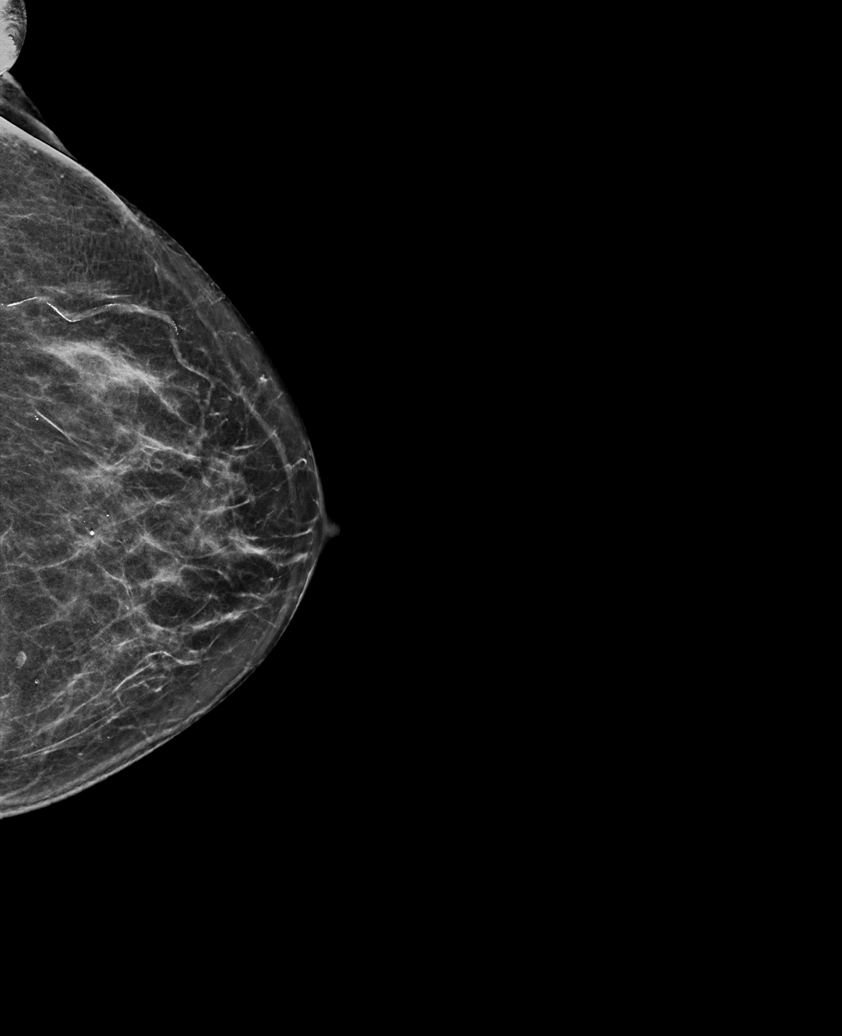

[R MLO synth-2D]
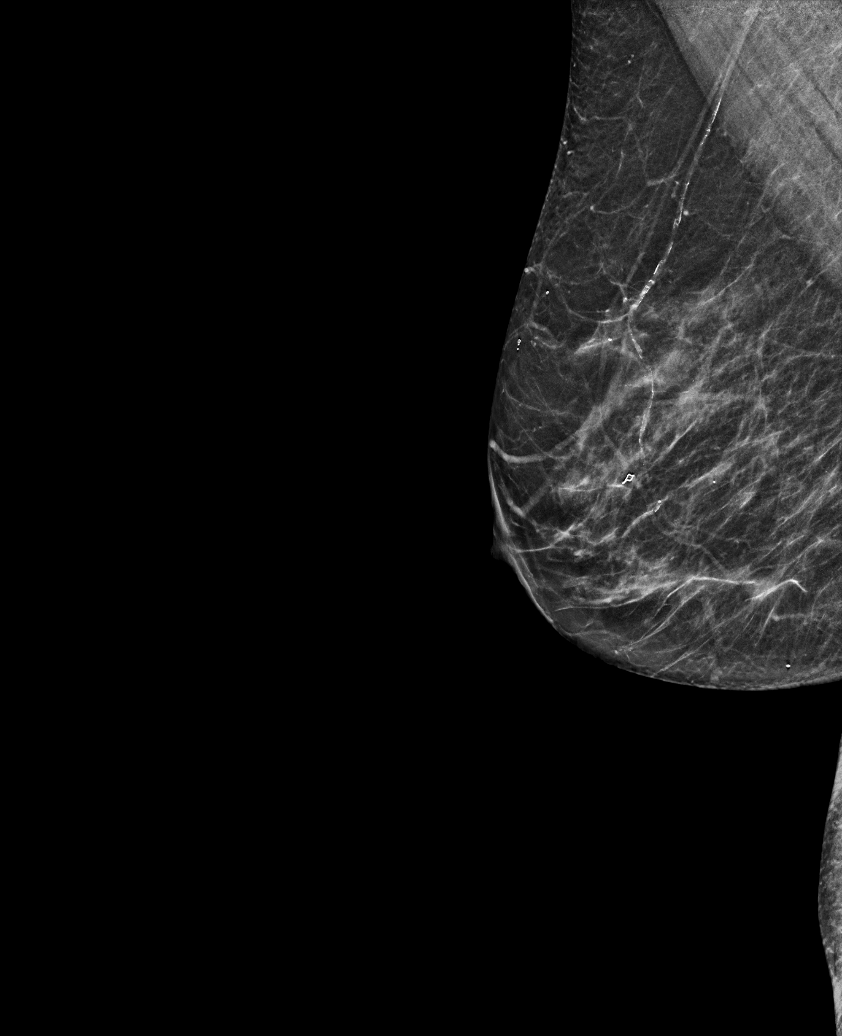

[L MLO tomo · tomo slice 29/57.0]
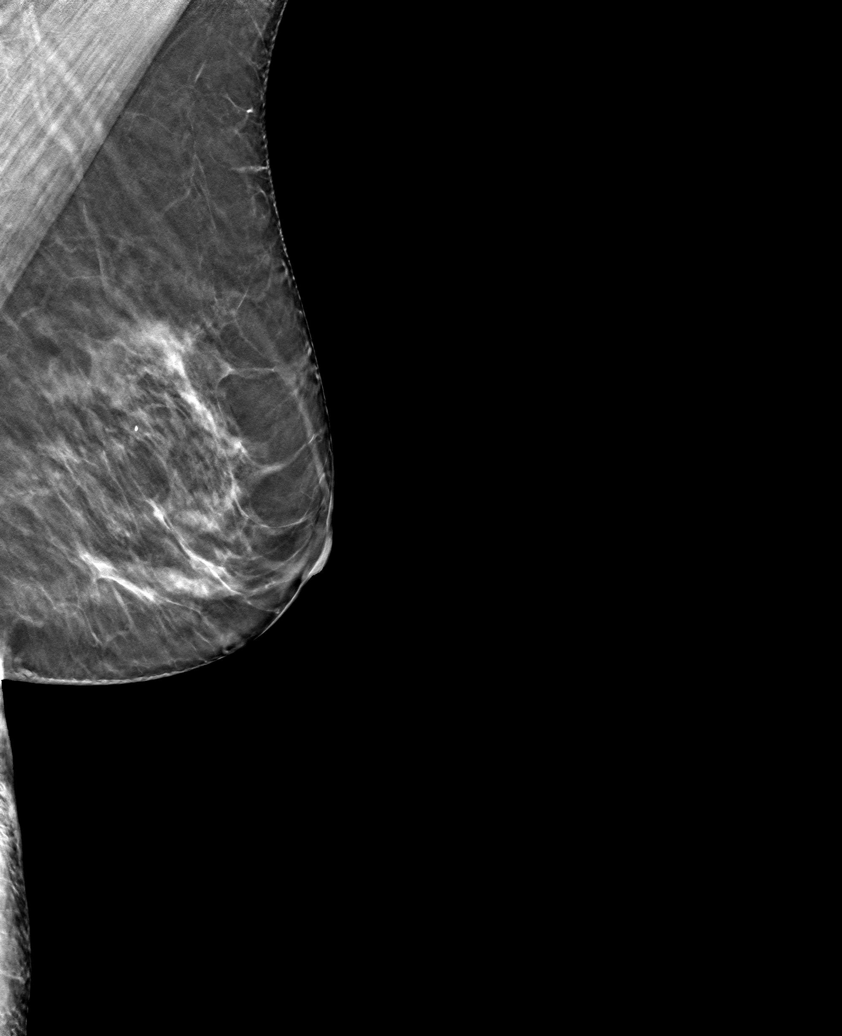

[6 of 30 positions shown; findings below may reference images not displayed]

ACR Breast Density Category b: There are scattered areas of
fibroglandular density.
FINDINGS: No suspicious calcifications, masses or areas of distortion are seen
in the bilateral breasts. No abnormal findings are seen on the spot
compression tomosynthesis images of the retroareolar left breast.

Ultrasound targeted to the retroareolar left breast demonstrates
normal fibroglandular tissue. No suspicious masses or areas of
shadowing are identified.
IMPRESSION: 1. There are no suspicious mammographic or targeted sonographic
abnormalities in the retroareolar left breast.

2.  No mammographic evidence of malignancy in the bilateral breasts.

RECOMMENDATION:
1. Clinical follow-up recommended for the left nipple pain. Any
further workup should be based on clinical grounds.

2.  Screening mammogram in one year.(Code:[5C])

I have discussed the findings and recommendations with the patient.
If applicable, a reminder letter will be sent to the patient
regarding the next appointment.

BI-RADS CATEGORY  2: Benign.

## 2020-10-09 DIAGNOSIS — R9431 Abnormal electrocardiogram [ECG] [EKG]: Secondary | ICD-10-CM | POA: Insufficient documentation

## 2020-10-09 DIAGNOSIS — R0602 Shortness of breath: Secondary | ICD-10-CM | POA: Insufficient documentation

## 2021-08-16 ENCOUNTER — Other Ambulatory Visit: Payer: Self-pay | Admitting: Family Medicine

## 2021-08-16 DIAGNOSIS — Z1231 Encounter for screening mammogram for malignant neoplasm of breast: Secondary | ICD-10-CM

## 2021-08-19 ENCOUNTER — Other Ambulatory Visit: Payer: Self-pay | Admitting: Physical Medicine & Rehabilitation

## 2021-08-19 DIAGNOSIS — M5442 Lumbago with sciatica, left side: Secondary | ICD-10-CM

## 2021-09-01 ENCOUNTER — Ambulatory Visit
Admission: RE | Admit: 2021-09-01 | Discharge: 2021-09-01 | Disposition: A | Discharge status: Home / Self Care | Payer: Medicare Other | Source: Ambulatory Visit | Attending: Physical Medicine & Rehabilitation | Admitting: Physical Medicine & Rehabilitation

## 2021-09-01 DIAGNOSIS — M5442 Lumbago with sciatica, left side: Secondary | ICD-10-CM | POA: Diagnosis present

## 2021-09-01 IMAGING — MR MR LUMBAR SPINE W/O CM
5 series · 31 of 48 positions shown · non-contrast
Comparison: None available

CLINICAL DATA: Left lower back pain

EXAM:
MRI LUMBAR SPINE WITHOUT CONTRAST
TECHNIQUE: Multiplanar, multisequence MR imaging of the lumbar spine was
performed. No intravenous contrast was administered.

[Series 5: T2 · sagittal · 4.0mm · 0.81mm/px · 7 of 17 slices shown (1 of 2)]
[im 1/17]
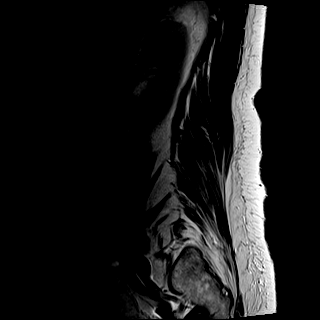
[im 3/17]
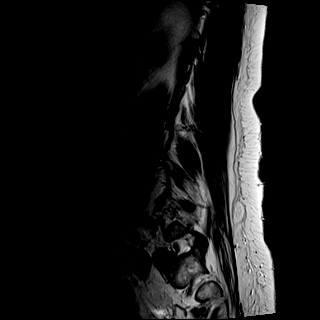
[im 6/17]
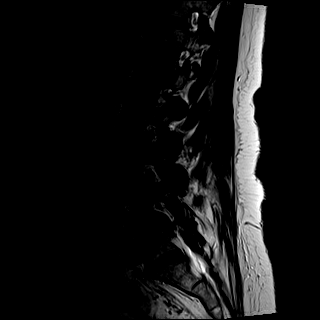
[im 9/17]
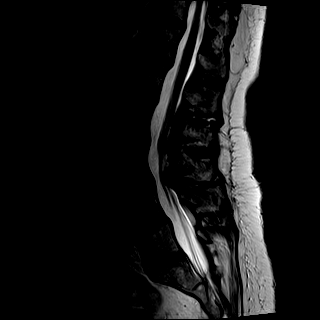
[im 11/17]
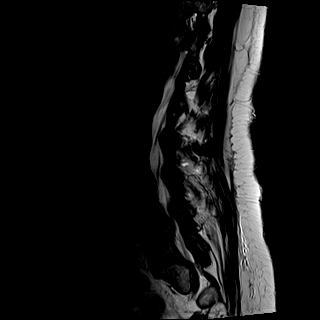
[im 14/17]
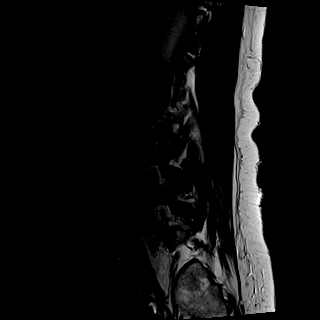
[im 17/17]
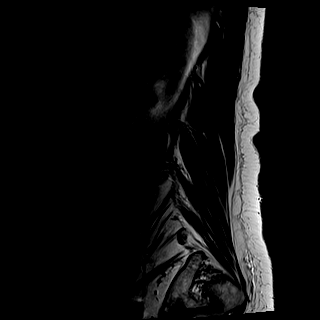

[Series 6: T1 · sagittal · 4.0mm · 0.81mm/px · 7 of 17 slices shown (1 of 2)]
[im 1/17]
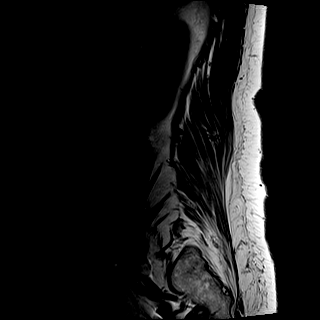
[im 3/17]
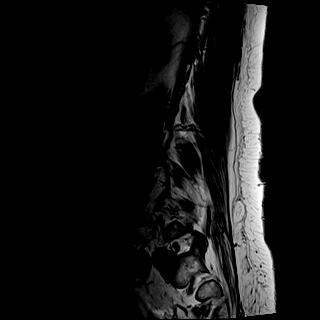
[im 6/17]
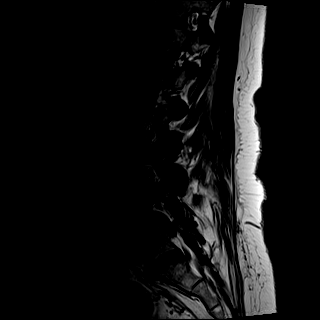
[im 9/17]
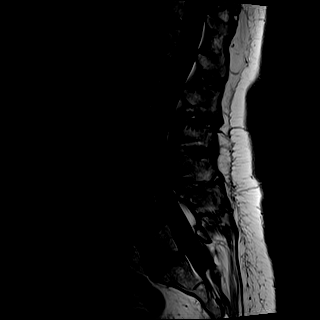
[im 11/17]
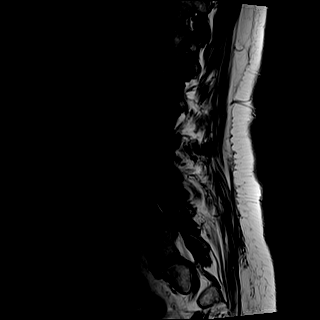
[im 14/17]
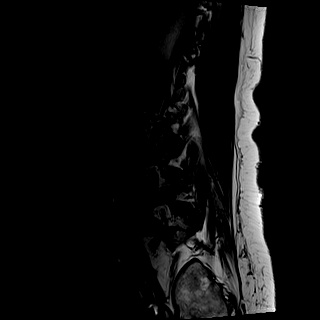
[im 17/17]
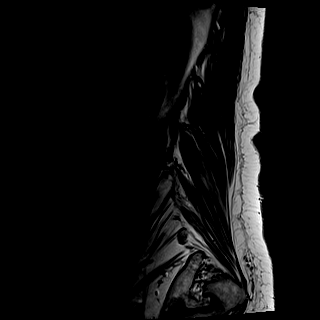

[Series 7: STIR · sagittal · 4.0mm · 0.41mm/px · 1 of 17 slices shown]
[im 1/17]
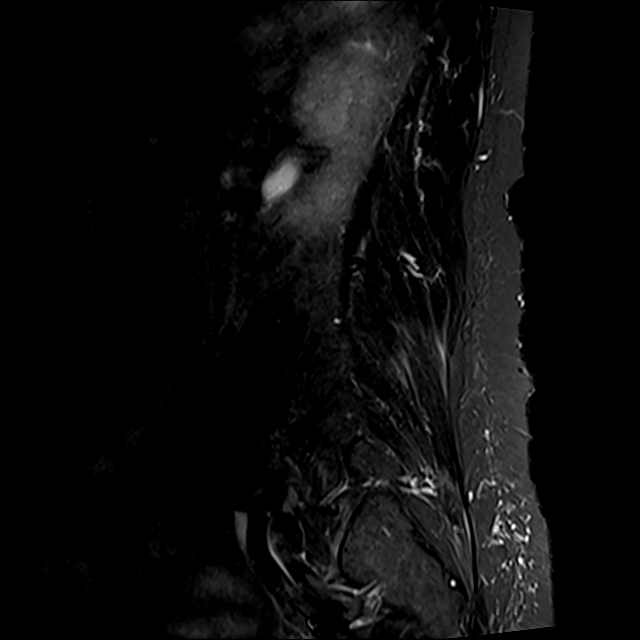

[Series 8: T2 · axial · 4.0mm · 0.78mm/px · z∈[-87,+131]mm · 8 of 38 slices shown (2 of 2)]
[im 1/38]
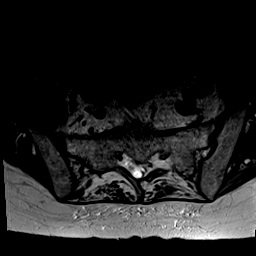
[im 6/38]
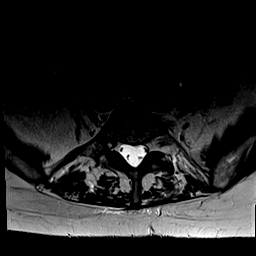
[im 12/38]
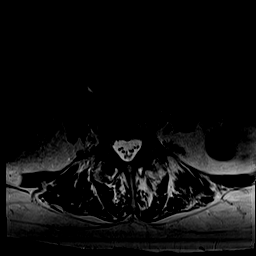
[im 18/38]
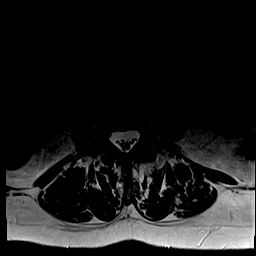
[im 20/38]
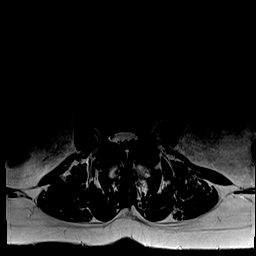
[im 26/38]
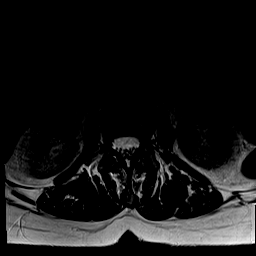
[im 32/38]
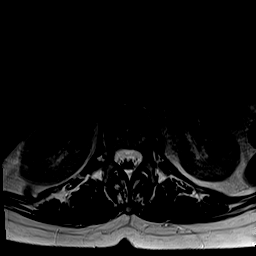
[im 38/38]
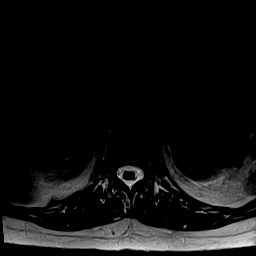

[Series 9: T1 · axial · 4.0mm · 0.39mm/px · z∈[-87,+131]mm · 8 of 38 slices shown (2 of 2)]
[im 1/38]
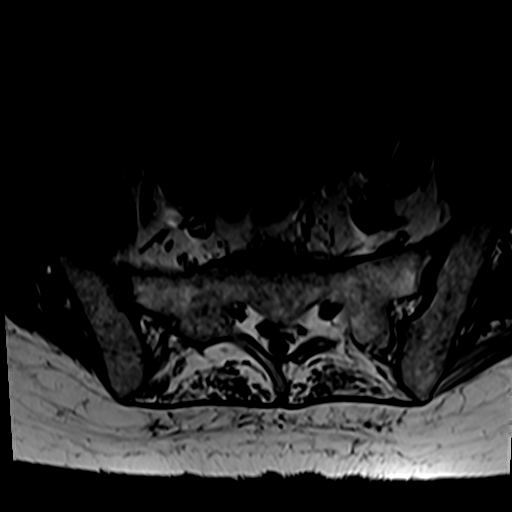
[im 6/38]
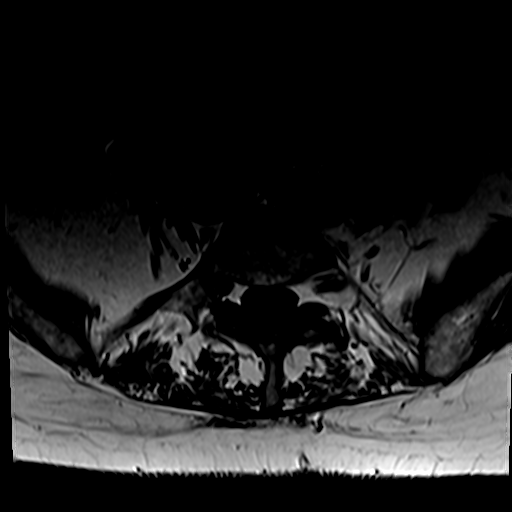
[im 12/38]
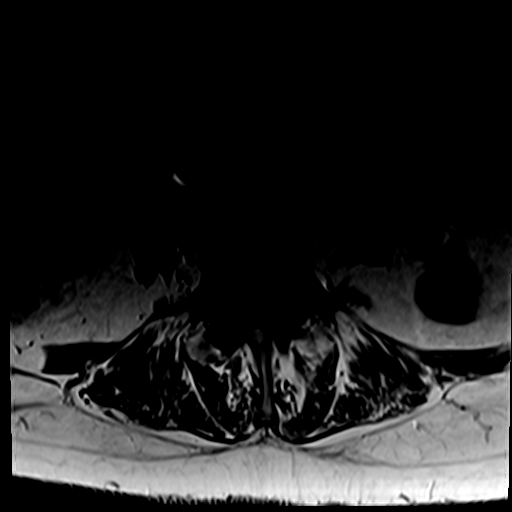
[im 18/38]
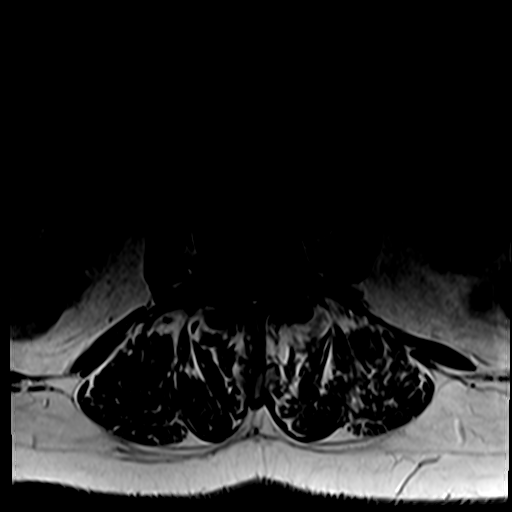
[im 20/38]
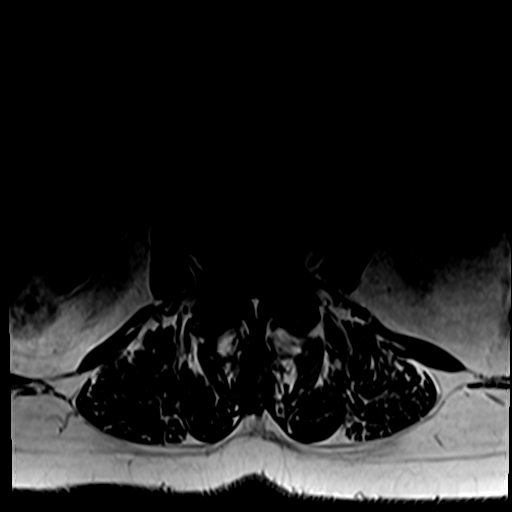
[im 26/38]
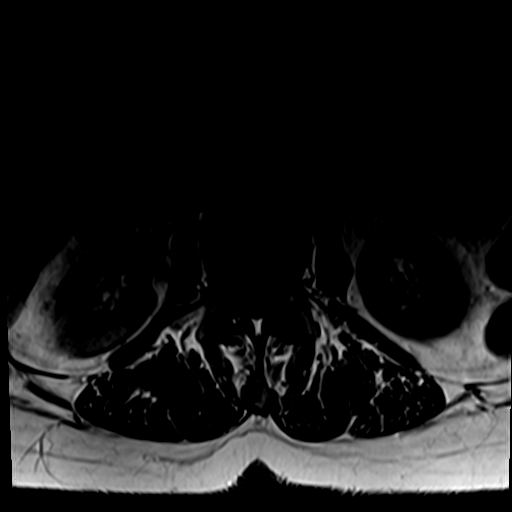
[im 32/38]
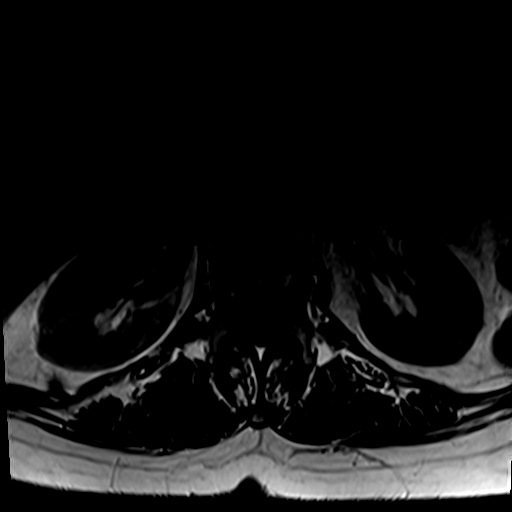
[im 38/38]
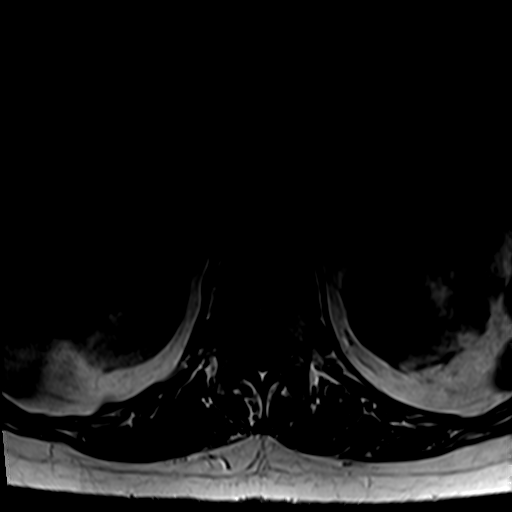

[31 of 48 positions shown; findings below may reference images not displayed]

FINDINGS: Segmentation:  Standard.

Alignment: Dextrocurvature. Trace anterolisthesis of L3 on L4. Grade
1 anterolisthesis of L4-L5.

Vertebrae:  No acute fracture or suspicious osseous lesion.

Conus medullaris and cauda equina: Conus extends to the L2 level.
Conus and cauda equina appear normal.

Paraspinal and other soft tissues: Atrophy of the inferior
paraspinous muscles. Otherwise negative.

Disc levels:

T12-L1: Minimal disc bulge. Mild facet arthropathy. No spinal canal
stenosis or neural foraminal narrowing.

L1-L2: Minimal disc bulge. Mild facet arthropathy. No spinal canal
stenosis or neural foraminal narrowing.

L2-L3: Minimal disc bulge. Mild facet arthropathy. No spinal canal
stenosis or neural foraminal narrowing.

L3-L4: Trace anterolisthesis with disc unroofing and mild disc bulge
with central annular fissure. Severe facet arthropathy, with a
medially directed synovial cyst extending from the left facets,
measuring up to 8 x 6 x 6 mm (AP x TR x CC) (series 5, image 10 and
series 8, image 24), which indents the left aspect thecal sac but
does not cause significant spinal canal stenosis. Narrowing of the
left-greater-than-right lateral recess. No neural foraminal
narrowing.

L4-L5: Grade 1 anterolisthesis with disc unroofing. Severe facet
arthropathy. No spinal canal stenosis. No neural foraminal
narrowing.

L5-S1: No significant disc bulge. Mild facet arthropathy. No spinal
canal stenosis or neural foraminal narrowing.
IMPRESSION: L3-L4 synovial cyst, secondary to severe facet arthropathy,
extending from the left facets and indenting the thecal sac, which
does not cause spinal canal stenosis but contributes to narrowing of
the left-greater-than-right lateral recess, which could affect the
descending L4 nerve roots.

## 2021-09-23 ENCOUNTER — Ambulatory Visit
Admission: RE | Admit: 2021-09-23 | Discharge: 2021-09-23 | Disposition: A | Discharge status: Home / Self Care | Payer: Medicare Other | Source: Ambulatory Visit | Attending: Family Medicine | Admitting: Family Medicine

## 2021-09-23 DIAGNOSIS — Z1231 Encounter for screening mammogram for malignant neoplasm of breast: Secondary | ICD-10-CM | POA: Diagnosis present

## 2021-09-23 DIAGNOSIS — M479 Spondylosis, unspecified: Secondary | ICD-10-CM | POA: Insufficient documentation

## 2021-09-23 DIAGNOSIS — M47812 Spondylosis without myelopathy or radiculopathy, cervical region: Secondary | ICD-10-CM | POA: Insufficient documentation

## 2021-09-23 DIAGNOSIS — M75110 Incomplete rotator cuff tear or rupture of unspecified shoulder, not specified as traumatic: Secondary | ICD-10-CM | POA: Insufficient documentation

## 2021-09-23 IMAGING — MG MM DIGITAL SCREENING BILAT W/ TOMO AND CAD
8 series · 8 of 24 positions shown · non-contrast
Comparison: Previous exam(s).

CLINICAL DATA: Screening.

EXAM:
DIGITAL SCREENING BILATERAL MAMMOGRAM WITH TOMOSYNTHESIS AND CAD
TECHNIQUE: Bilateral screening digital craniocaudal and mediolateral oblique
mammograms were obtained. Bilateral screening digital breast
tomosynthesis was performed. The images were evaluated with
computer-aided detection.

[R CC synth-2D]
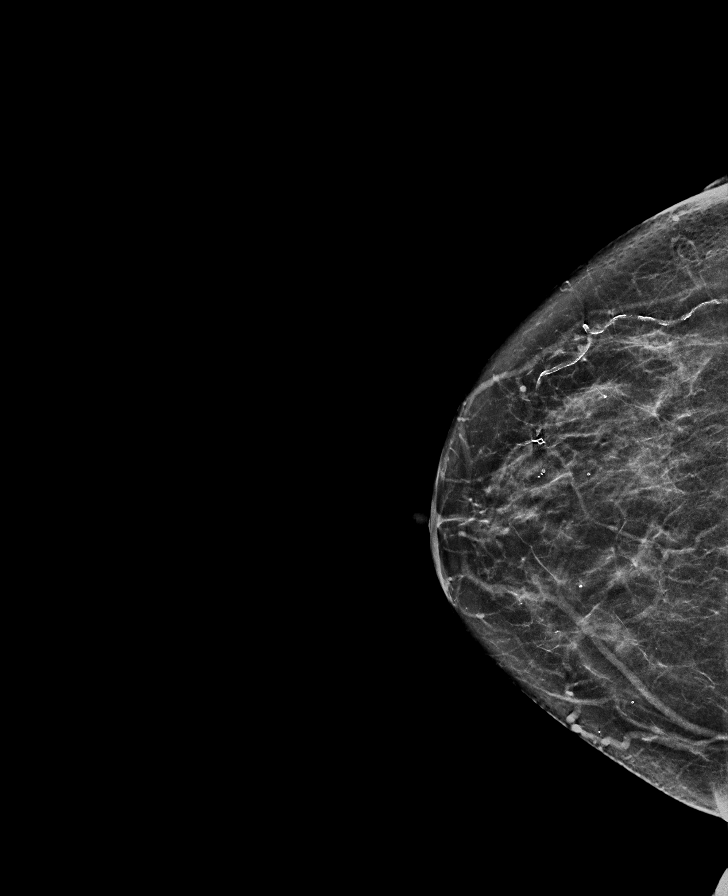

[L CC synth-2D]
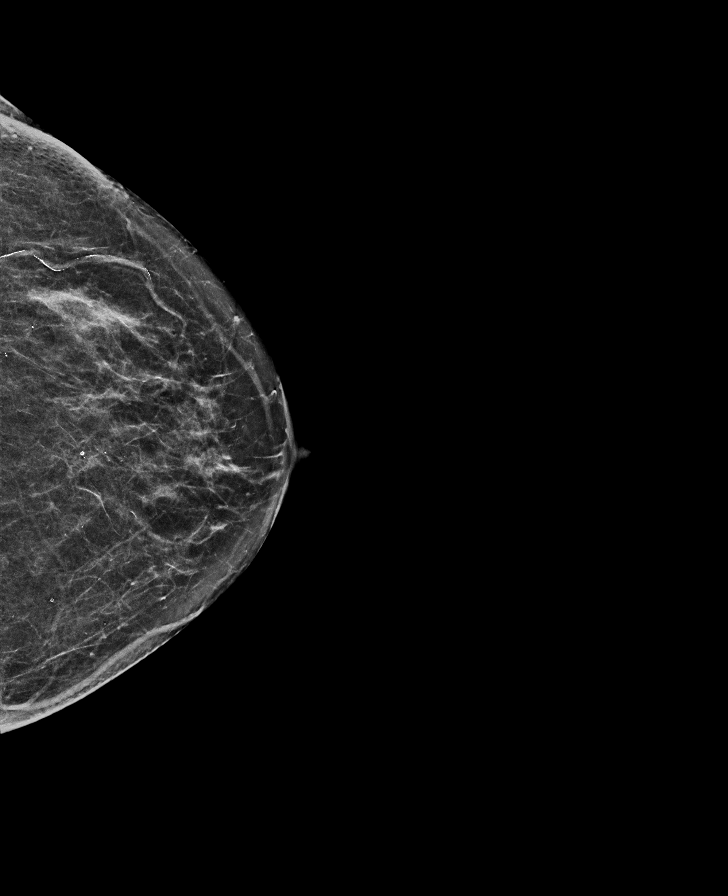

[R MLO synth-2D]
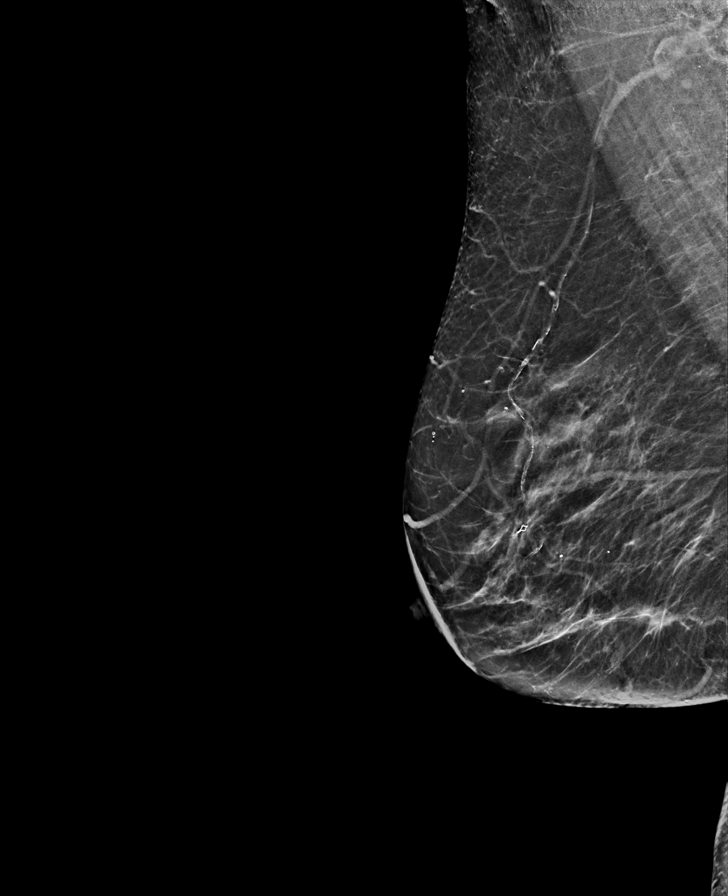

[L MLO synth-2D]
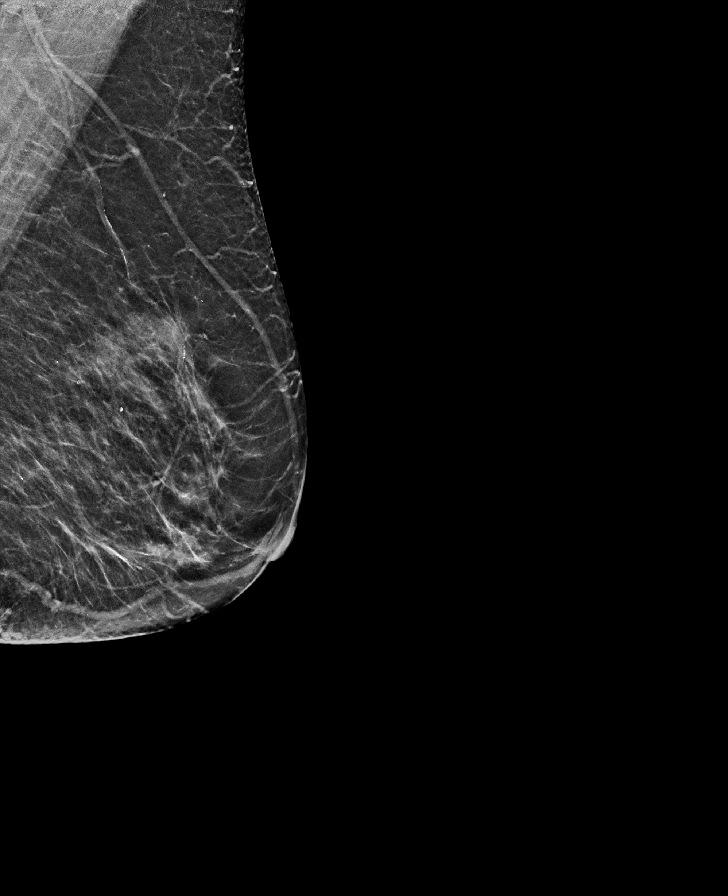

[R CC tomo · tomo slice 31/61.0]
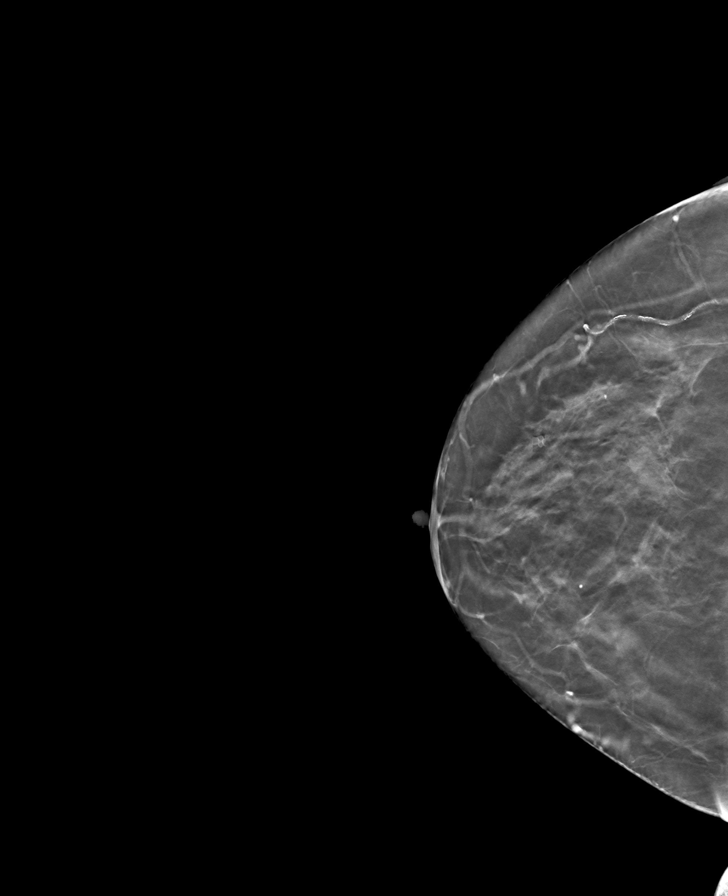

[R MLO tomo · tomo slice 32/63.0]
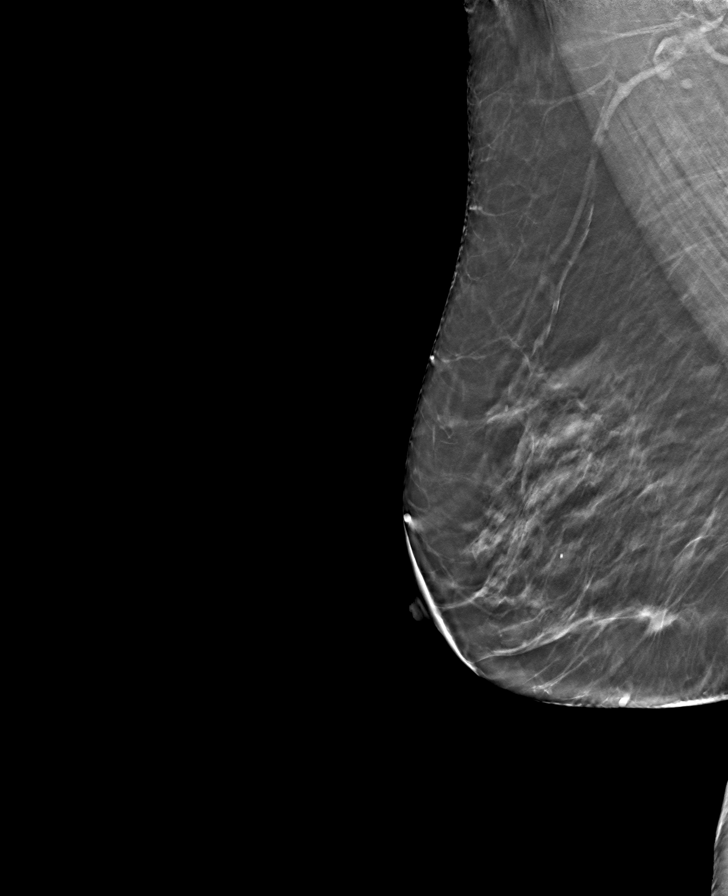

[L MLO tomo · tomo slice 29/58.0]
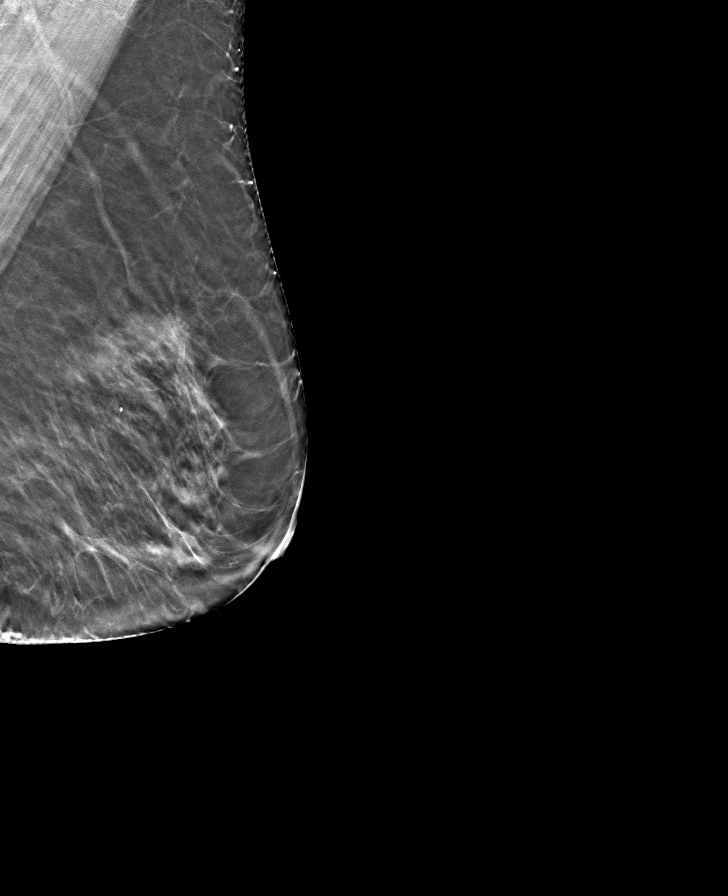

[L CC tomo · tomo slice 29/58.0]
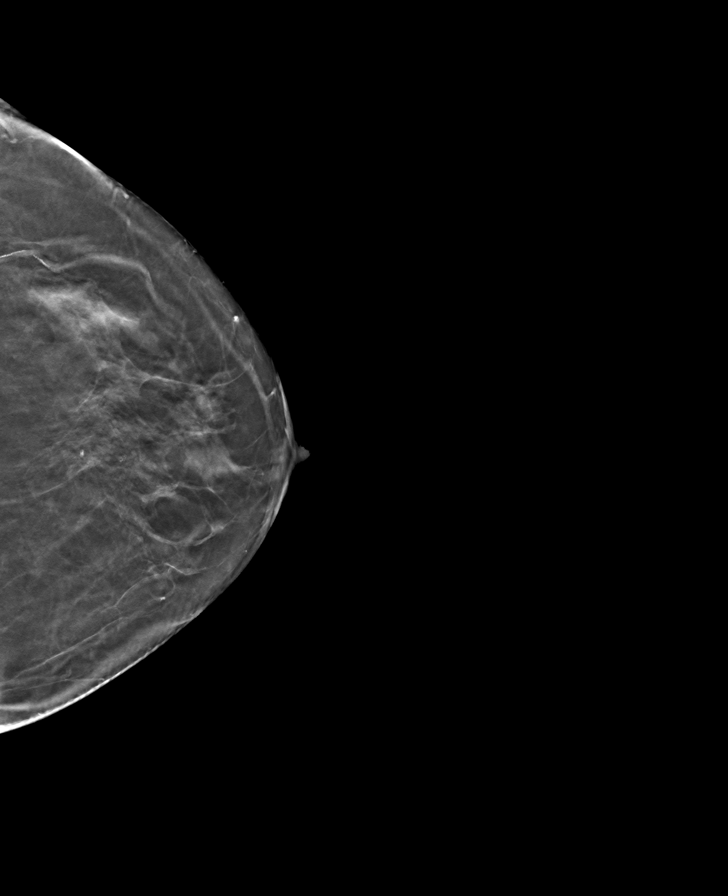

[8 of 24 positions shown; findings below may reference images not displayed]

ACR Breast Density Category b: There are scattered areas of
fibroglandular density.
FINDINGS: There are no findings suspicious for malignancy.
IMPRESSION: No mammographic evidence of malignancy. A result letter of this
screening mammogram will be mailed directly to the patient.

RECOMMENDATION:
Screening mammogram in one year. (Code:[BY])

BI-RADS CATEGORY  1: Negative.

## 2021-12-08 ENCOUNTER — Other Ambulatory Visit: Payer: Self-pay | Admitting: Orthopedic Surgery

## 2021-12-08 DIAGNOSIS — M1711 Unilateral primary osteoarthritis, right knee: Secondary | ICD-10-CM

## 2021-12-08 DIAGNOSIS — Z9889 Other specified postprocedural states: Secondary | ICD-10-CM

## 2021-12-10 ENCOUNTER — Ambulatory Visit
Admission: RE | Admit: 2021-12-10 | Discharge: 2021-12-10 | Disposition: A | Discharge status: Home / Self Care | Payer: Medicare Other | Source: Ambulatory Visit | Attending: Orthopedic Surgery | Admitting: Orthopedic Surgery

## 2021-12-10 DIAGNOSIS — M1711 Unilateral primary osteoarthritis, right knee: Secondary | ICD-10-CM

## 2021-12-10 DIAGNOSIS — Z9889 Other specified postprocedural states: Secondary | ICD-10-CM

## 2021-12-27 ENCOUNTER — Other Ambulatory Visit: Payer: Self-pay | Admitting: Physician Assistant

## 2021-12-27 DIAGNOSIS — Z780 Asymptomatic menopausal state: Secondary | ICD-10-CM

## 2021-12-29 DIAGNOSIS — M1711 Unilateral primary osteoarthritis, right knee: Secondary | ICD-10-CM | POA: Insufficient documentation

## 2022-01-12 ENCOUNTER — Other Ambulatory Visit: Payer: Self-pay | Admitting: Surgery

## 2022-01-19 ENCOUNTER — Other Ambulatory Visit: Payer: Self-pay

## 2022-01-19 ENCOUNTER — Encounter
Admission: RE | Admit: 2022-01-19 | Discharge: 2022-01-19 | Disposition: A | Discharge status: Home / Self Care | Payer: Medicare Other | Source: Ambulatory Visit | Attending: Surgery | Admitting: Surgery

## 2022-01-19 VITALS — BP 144/61 | HR 71 | Resp 16

## 2022-01-19 DIAGNOSIS — Z01818 Encounter for other preprocedural examination: Secondary | ICD-10-CM | POA: Insufficient documentation

## 2022-01-19 HISTORY — DX: Hyperlipidemia, unspecified: E78.5

## 2022-01-19 HISTORY — DX: Dry eye syndrome of unspecified lacrimal gland: H04.129

## 2022-01-19 HISTORY — DX: Family history of other specified conditions: Z84.89

## 2022-01-19 HISTORY — DX: Headache, unspecified: R51.9

## 2022-01-19 HISTORY — DX: Unspecified glaucoma: H40.9

## 2022-01-19 LAB — CBC WITH DIFFERENTIAL/PLATELET
Abs Immature Granulocytes: 0.05 10*3/uL (ref 0.00–0.07)
Basophils Absolute: 0.1 10*3/uL (ref 0.0–0.1)
Basophils Relative: 1 %
Eosinophils Absolute: 0.1 10*3/uL (ref 0.0–0.5)
Eosinophils Relative: 1 %
HCT: 35 % — ABNORMAL LOW (ref 36.0–46.0)
Hemoglobin: 11.7 g/dL — ABNORMAL LOW (ref 12.0–15.0)
Immature Granulocytes: 1 %
Lymphocytes Relative: 23 %
Lymphs Abs: 2.2 10*3/uL (ref 0.7–4.0)
MCH: 30.2 pg (ref 26.0–34.0)
MCHC: 33.4 g/dL (ref 30.0–36.0)
MCV: 90.4 fL (ref 80.0–100.0)
Monocytes Absolute: 0.6 10*3/uL (ref 0.1–1.0)
Monocytes Relative: 7 %
Neutro Abs: 6.3 10*3/uL (ref 1.7–7.7)
Neutrophils Relative %: 67 %
Platelets: 407 10*3/uL — ABNORMAL HIGH (ref 150–400)
RBC: 3.87 MIL/uL (ref 3.87–5.11)
RDW: 13.1 % (ref 11.5–15.5)
WBC: 9.4 10*3/uL (ref 4.0–10.5)
nRBC: 0 % (ref 0.0–0.2)

## 2022-01-19 LAB — COMPREHENSIVE METABOLIC PANEL
ALT: 16 U/L (ref 0–44)
AST: 22 U/L (ref 15–41)
Albumin: 4.3 g/dL (ref 3.5–5.0)
Alkaline Phosphatase: 73 U/L (ref 38–126)
Anion gap: 9 (ref 5–15)
BUN: 18 mg/dL (ref 8–23)
CO2: 28 mmol/L (ref 22–32)
Calcium: 9.2 mg/dL (ref 8.9–10.3)
Chloride: 94 mmol/L — ABNORMAL LOW (ref 98–111)
Creatinine, Ser: 0.72 mg/dL (ref 0.44–1.00)
GFR, Estimated: >60 mL/min (ref >60)
Glucose, Bld: 101 mg/dL — ABNORMAL HIGH (ref 70–99)
Potassium: 3.4 mmol/L — ABNORMAL LOW (ref 3.5–5.1)
Sodium: 131 mmol/L — ABNORMAL LOW (ref 135–145)
Total Bilirubin: 0.8 mg/dL (ref 0.3–1.2)
Total Protein: 8 g/dL (ref 6.5–8.1)

## 2022-01-19 LAB — URINALYSIS, ROUTINE W REFLEX MICROSCOPIC
Bilirubin Urine: NEGATIVE
Glucose, UA: NEGATIVE mg/dL
Hgb urine dipstick: NEGATIVE
Ketones, ur: NEGATIVE mg/dL
Leukocytes,Ua: NEGATIVE
Nitrite: NEGATIVE
Protein, ur: NEGATIVE mg/dL
Specific Gravity, Urine: 1.016 (ref 1.005–1.030)
pH: 5 (ref 5.0–8.0)

## 2022-01-19 LAB — TYPE AND SCREEN
ABO/RH(D): O POS
Antibody Screen: NEGATIVE

## 2022-01-19 LAB — SURGICAL PCR SCREEN
MRSA, PCR: NEGATIVE
Staphylococcus aureus: NEGATIVE

## 2022-01-19 NOTE — Patient Instructions (Addendum)
Your procedure is scheduled on:01-26-22 Wednesday Report to the Registration Desk on the 1st floor of the [facility].Then proceed to the 2nd floor Surgery Desk To find out your arrival time, please call [phone] between 1PM - 3PM on:01-25-22 Tuesday If your arrival time is 6:00 am, do not arrive prior to that time as the [facility] entrance doors do not open until 6:00 am.  REMEMBER: Instructions that are not followed completely may result in serious medical risk, up to and including death; or upon the discretion of your surgeon and anesthesiologist your surgery may need to be rescheduled.  Do not eat food after midnight the night before surgery.  No gum chewing, lozengers or hard candies.  You may however, drink CLEAR liquids up to 2 hours before you are scheduled to arrive for your surgery. Do not drink anything within 2 hours of your scheduled arrival time.  Clear liquids include: - water  - apple juice without pulp - gatorade (not RED colors) - black coffee or tea (Do NOT add milk or creamers to the coffee or tea) Do NOT drink anything that is not on this list.  In addition, your doctor has ordered for you to drink the provided  Ensure Pre-Surgery Clear Carbohydrate Drink  Drinking this carbohydrate drink up to two hours before surgery helps to reduce insulin resistance and improve patient outcomes. Please complete drinking 2 hours prior to scheduled arrival time.  TAKE THESE MEDICATIONS THE MORNING OF SURGERY WITH A SIP OF WATER: -amLODipine (NORVASC) -omeprazole (PRILOSEC)-take one the night before surgery and one the morning of surgery  One week prior to surgery: Stop Anti-inflammatories (NSAIDS) such as Meloxicam (Mobic) Advil, Aleve, Ibuprofen, Motrin, Naproxen, Naprosyn and Aspirin based products such as Excedrin, Goodys Powder, BC Powder.You may however, continue to take Tylenol if needed for pain up until the day of surgery.  Stop ANY OVER THE COUNTER  supplements/vitamins NOW (01-19-22) until after surgery (Magnesium, Calcium +D)-ok to continue your melatonin up until the day prior to surgery  No Alcohol for 24 hours before or after surgery.  No Smoking including e-cigarettes for 24 hours prior to surgery.  No chewable tobacco products for at least 6 hours prior to surgery.  No nicotine patches on the day of surgery.  Do not use any "recreational" drugs for at least a week prior to your surgery.  Please be advised that the combination of cocaine and anesthesia may have negative outcomes, up to and including death. If you test positive for cocaine, your surgery will be cancelled.  On the morning of surgery brush your teeth with toothpaste and water, you may rinse your mouth with mouthwash if you wish. Do not swallow any toothpaste or mouthwash.  Use CHG Soap as directed on instruction sheet.  Do not wear jewelry, make-up, hairpins, clips or nail polish.  Do not wear lotions, powders, or perfumes.   Do not shave body from the neck down 48 hours prior to surgery just in case you cut yourself which could leave a site for infection.  Also, freshly shaved skin may become irritated if using the CHG soap.  Contact lenses, hearing aids and dentures may not be worn into surgery.  Do not bring valuables to the hospital. [facility] is not responsible for any missing/lost belongings or valuables.   Notify your doctor if there is any change in your medical condition (cold, fever, infection).  Wear comfortable clothing (specific to your surgery type) to the hospital.  After surgery,  you can help prevent lung complications by doing breathing exercises.  Take deep breaths and cough every 1-2 hours. Your doctor may order a device called an Incentive Spirometer to help you take deep breaths. When coughing or sneezing, hold a pillow firmly against your incision with both hands. This is called "splinting." Doing this helps protect your incision. It  also decreases belly discomfort.  If you are being admitted to the hospital overnight, leave your suitcase in the car. After surgery it may be brought to your room.  If you are being discharged the day of surgery, you will not be allowed to drive home. You will need a responsible adult (18 years or older) to drive you home and stay with you that night.   If you are taking public transportation, you will need to have a responsible adult (18 years or older) with you. Please confirm with your physician that it is acceptable to use public transportation.   Please call the [facility] at [phone] if you have any questions about these instructions.  Surgery Visitation Policy:  Patients undergoing a surgery or procedure may have two family members or support persons with them as long as the person is not COVID-19 positive or experiencing its symptoms.   Inpatient Visitation:    Visiting hours are 7 a.m. to 8 p.m. Up to four visitors are allowed at one time in a patient room, including children. The visitors may rotate out with other people during the day. One designated support person (adult) may remain overnight.    How to Use an Incentive Spirometer An incentive spirometer is a tool that measures how well you are filling your lungs with each breath. Learning to take long, deep breaths using this tool can help you keep your lungs clear and active. This may help to reverse or lessen your chance of developing breathing (pulmonary) problems, especially infection. You may be asked to use a spirometer: After a surgery. If you have a lung problem or a history of smoking. After a long period of time when you have been unable to move or be active. If the spirometer includes an indicator to show the highest number that you have reached, your health care provider or respiratory therapist will help you set a goal. Keep a log of your progress as told by your health care  provider. What are the risks? Breathing too quickly may cause dizziness or cause you to pass out. Take your time so you do not get dizzy or light-headed. If you are in pain, you may need to take pain medicine before doing incentive spirometry. It is harder to take a deep breath if you are having pain. How to use your incentive spirometer  Sit up on the edge of your bed or on a chair. Hold the incentive spirometer so that it is in an upright position. Before you use the spirometer, breathe out normally. Place the mouthpiece in your mouth. Make sure your lips are closed tightly around it. Breathe in slowly and as deeply as you can through your mouth, causing the piston or the ball to rise toward the top of the chamber. Hold your breath for 3-5 seconds, or for as long as possible. If the spirometer includes a coach indicator, use this to guide you in breathing. Slow down your breathing if the indicator goes above the marked areas. Remove the mouthpiece from your mouth and breathe out normally. The piston or ball will return to the bottom of  the chamber. Rest for a few seconds, then repeat the steps 10 or more times. Take your time and take a few normal breaths between deep breaths so that you do not get dizzy or light-headed. Do this every 1-2 hours when you are awake. If the spirometer includes a goal marker to show the highest number you have reached (best effort), use this as a goal to work toward during each repetition. After each set of 10 deep breaths, cough a few times. This will help to make sure that your lungs are clear. If you have an incision on your chest or abdomen from surgery, place a pillow or a rolled-up towel firmly against the incision when you cough. This can help to reduce pain while taking deep breaths and coughing. General tips When you are able to get out of bed: Walk around often. Continue to take deep breaths and cough in order to clear your lungs. Keep using the  incentive spirometer until your health care provider says it is okay to stop using it. If you have been in the hospital, you may be told to keep using the spirometer at home. Contact a health care provider if: You are having difficulty using the spirometer. You have trouble using the spirometer as often as instructed. Your pain medicine is not giving enough relief for you to use the spirometer as told. You have a fever. Get help right away if: You develop shortness of breath. You develop a cough with bloody mucus from the lungs. You have fluid or blood coming from an incision site after you cough. Summary An incentive spirometer is a tool that can help you learn to take long, deep breaths to keep your lungs clear and active. You may be asked to use a spirometer after a surgery, if you have a lung problem or a history of smoking, or if you have been inactive for a long period of time. Use your incentive spirometer as instructed every 1-2 hours while you are awake. If you have an incision on your chest or abdomen, place a pillow or a rolled-up towel firmly against your incision when you cough. This will help to reduce pain. Get help right away if you have shortness of breath, you cough up bloody mucus, or blood comes from your incision when you cough. This information is not intended to replace advice given to you by your health care provider. Make sure you discuss any questions you have with your health care provider. Document Revised: 07/15/2019 Document Reviewed: 07/15/2019 Elsevier Patient Education  [place].

## 2022-01-20 ENCOUNTER — Ambulatory Visit
Admission: RE | Admit: 2022-01-20 | Discharge: 2022-01-20 | Disposition: A | Discharge status: Home / Self Care | Payer: Medicare Other | Source: Ambulatory Visit | Attending: Physician Assistant | Admitting: Physician Assistant

## 2022-01-20 DIAGNOSIS — Z780 Asymptomatic menopausal state: Secondary | ICD-10-CM | POA: Insufficient documentation

## 2022-01-26 ENCOUNTER — Encounter: Payer: Self-pay | Admitting: Surgery

## 2022-01-26 ENCOUNTER — Ambulatory Visit: Payer: Medicare Other | Admitting: Urgent Care

## 2022-01-26 ENCOUNTER — Ambulatory Visit: Payer: Medicare Other | Admitting: Anesthesiology

## 2022-01-26 ENCOUNTER — Observation Stay
Admission: RE | Admit: 2022-01-26 | Discharge: 2022-01-29 | Disposition: A | Discharge status: Home Health | Payer: Medicare Other | Attending: Surgery | Admitting: Surgery

## 2022-01-26 ENCOUNTER — Other Ambulatory Visit: Payer: Self-pay

## 2022-01-26 ENCOUNTER — Observation Stay: Payer: Medicare Other

## 2022-01-26 ENCOUNTER — Encounter
Admission: RE | Disposition: A | Discharge status: Home Health | Payer: Self-pay | Source: Home / Self Care | Attending: Surgery

## 2022-01-26 DIAGNOSIS — Z8616 Personal history of COVID-19: Secondary | ICD-10-CM | POA: Diagnosis not present

## 2022-01-26 DIAGNOSIS — I10 Essential (primary) hypertension: Secondary | ICD-10-CM | POA: Insufficient documentation

## 2022-01-26 DIAGNOSIS — Z96651 Presence of right artificial knee joint: Secondary | ICD-10-CM

## 2022-01-26 DIAGNOSIS — Z79899 Other long term (current) drug therapy: Secondary | ICD-10-CM | POA: Diagnosis not present

## 2022-01-26 DIAGNOSIS — Z85828 Personal history of other malignant neoplasm of skin: Secondary | ICD-10-CM | POA: Insufficient documentation

## 2022-01-26 DIAGNOSIS — Z8541 Personal history of malignant neoplasm of cervix uteri: Secondary | ICD-10-CM | POA: Diagnosis not present

## 2022-01-26 DIAGNOSIS — Z87891 Personal history of nicotine dependence: Secondary | ICD-10-CM | POA: Diagnosis not present

## 2022-01-26 DIAGNOSIS — M1711 Unilateral primary osteoarthritis, right knee: Secondary | ICD-10-CM | POA: Diagnosis not present

## 2022-01-26 HISTORY — PX: TOTAL KNEE ARTHROPLASTY: SHX125

## 2022-01-26 LAB — ABO/RH: ABO/RH(D): O POS

## 2022-01-26 SURGERY — ARTHROPLASTY, KNEE, TOTAL
Anesthesia: Spinal | Site: Knee | Laterality: Right

## 2022-01-26 MED ORDER — KETOROLAC TROMETHAMINE 15 MG/ML IJ SOLN
7.5000 mg | Freq: Four times a day (QID) | INTRAMUSCULAR | Status: AC
  Administered 2022-01-26 – 2022-01-27 (×3): 7.5 mg via INTRAVENOUS
  Filled 2022-01-26 (×3): qty 1

## 2022-01-26 MED ORDER — BUPIVACAINE-EPINEPHRINE (PF) 0.5% -1:200000 IJ SOLN
INTRAMUSCULAR | Status: AC
  Filled 2022-01-26: qty 30

## 2022-01-26 MED ORDER — SODIUM CHLORIDE 0.9 % IV SOLN
INTRAVENOUS | Status: DC | PRN
  Administered 2022-01-26: 60 mL

## 2022-01-26 MED ORDER — FENTANYL CITRATE (PF) 100 MCG/2ML IJ SOLN
INTRAMUSCULAR | Status: DC | PRN
  Administered 2022-01-26 (×4): 25 ug via INTRAVENOUS

## 2022-01-26 MED ORDER — CHLORHEXIDINE GLUCONATE 0.12 % MT SOLN
OROMUCOSAL | Status: AC
  Administered 2022-01-26: 15 mL via OROMUCOSAL
  Filled 2022-01-26: qty 15

## 2022-01-26 MED ORDER — FENTANYL CITRATE (PF) 100 MCG/2ML IJ SOLN
INTRAMUSCULAR | Status: AC
  Filled 2022-01-26: qty 2

## 2022-01-26 MED ORDER — OXYCODONE HCL 5 MG/5ML PO SOLN: 5.0000 mg | Freq: Once | ORAL | Status: DC | PRN

## 2022-01-26 MED ORDER — CEFAZOLIN SODIUM-DEXTROSE 2-4 GM/100ML-% IV SOLN
2.0000 g | Freq: Four times a day (QID) | INTRAVENOUS | Status: AC
  Administered 2022-01-26 – 2022-01-27 (×2): 2 g via INTRAVENOUS
  Filled 2022-01-26 (×3): qty 100

## 2022-01-26 MED ORDER — ACETAMINOPHEN 10 MG/ML IV SOLN
INTRAVENOUS | Status: AC
  Filled 2022-01-26: qty 100

## 2022-01-26 MED ORDER — ORAL CARE MOUTH RINSE: 15.0000 mL | Freq: Once | OROMUCOSAL | Status: AC

## 2022-01-26 MED ORDER — FENTANYL CITRATE (PF) 100 MCG/2ML IJ SOLN: 25.0000 ug | INTRAMUSCULAR | Status: DC | PRN

## 2022-01-26 MED ORDER — METOCLOPRAMIDE HCL 5 MG/ML IJ SOLN: 5.0000 mg | Freq: Three times a day (TID) | INTRAMUSCULAR | Status: DC | PRN

## 2022-01-26 MED ORDER — MIDAZOLAM HCL 2 MG/2ML IJ SOLN
INTRAMUSCULAR | Status: AC
  Filled 2022-01-26: qty 2

## 2022-01-26 MED ORDER — MAGNESIUM CITRATE PO SOLN
250.0000 mL | Freq: Every day | ORAL | Status: DC
  Administered 2022-01-27: 250 mL via ORAL
  Filled 2022-01-26 (×2): qty 296

## 2022-01-26 MED ORDER — FLEET ENEMA 7-19 GM/118ML RE ENEM: 1.0000 | ENEMA | Freq: Once | RECTAL | Status: DC | PRN

## 2022-01-26 MED ORDER — MIDAZOLAM HCL 5 MG/5ML IJ SOLN
INTRAMUSCULAR | Status: DC | PRN
  Administered 2022-01-26: 2 mg via INTRAVENOUS

## 2022-01-26 MED ORDER — AMLODIPINE BESYLATE 5 MG PO TABS
5.0000 mg | ORAL_TABLET | Freq: Every day | ORAL | Status: DC
  Administered 2022-01-27 – 2022-01-29 (×3): 5 mg via ORAL
  Filled 2022-01-26 (×3): qty 1

## 2022-01-26 MED ORDER — BUPIVACAINE-EPINEPHRINE (PF) 0.5% -1:200000 IJ SOLN
INTRAMUSCULAR | Status: DC | PRN
  Administered 2022-01-26: 30 mL via PERINEURAL

## 2022-01-26 MED ORDER — SIMVASTATIN 20 MG PO TABS
10.0000 mg | ORAL_TABLET | Freq: Every day | ORAL | Status: DC
  Administered 2022-01-26 – 2022-01-28 (×3): 10 mg via ORAL
  Filled 2022-01-26 (×3): qty 1

## 2022-01-26 MED ORDER — SODIUM CHLORIDE FLUSH 0.9 % IV SOLN
INTRAVENOUS | Status: AC
  Filled 2022-01-26: qty 40

## 2022-01-26 MED ORDER — POLYVINYL ALCOHOL 1.4 % OP SOLN
1.0000 [drp] | Freq: Three times a day (TID) | OPHTHALMIC | Status: DC
  Administered 2022-01-26 – 2022-01-29 (×6): 1 [drp] via OPHTHALMIC
  Filled 2022-01-26 (×2): qty 15

## 2022-01-26 MED ORDER — SODIUM CHLORIDE 0.9 % IV SOLN: INTRAVENOUS | Status: DC

## 2022-01-26 MED ORDER — ADULT MULTIVITAMIN W/MINERALS CH
1.0000 | ORAL_TABLET | Freq: Every day | ORAL | Status: DC
  Administered 2022-01-27 – 2022-01-29 (×3): 1 via ORAL
  Filled 2022-01-26 (×3): qty 1

## 2022-01-26 MED ORDER — MELATONIN 5 MG PO TABS
5.0000 mg | ORAL_TABLET | Freq: Every day | ORAL | Status: DC
  Administered 2022-01-26 – 2022-01-28 (×3): 5 mg via ORAL
  Filled 2022-01-26 (×3): qty 1

## 2022-01-26 MED ORDER — DROPERIDOL 2.5 MG/ML IJ SOLN: 0.6250 mg | Freq: Once | INTRAMUSCULAR | Status: DC | PRN

## 2022-01-26 MED ORDER — OXYCODONE HCL 5 MG PO TABS: 5.0000 mg | ORAL_TABLET | Freq: Once | ORAL | Status: DC | PRN

## 2022-01-26 MED ORDER — ACETAMINOPHEN 10 MG/ML IV SOLN
INTRAVENOUS | Status: DC | PRN
  Administered 2022-01-26: 1000 mg via INTRAVENOUS

## 2022-01-26 MED ORDER — DOCUSATE SODIUM 100 MG PO CAPS
100.0000 mg | ORAL_CAPSULE | Freq: Two times a day (BID) | ORAL | Status: DC
  Administered 2022-01-26 – 2022-01-29 (×6): 100 mg via ORAL
  Filled 2022-01-26 (×6): qty 1

## 2022-01-26 MED ORDER — PROMETHAZINE HCL 25 MG/ML IJ SOLN: 6.2500 mg | INTRAMUSCULAR | Status: DC | PRN

## 2022-01-26 MED ORDER — TRIAMCINOLONE ACETONIDE 40 MG/ML IJ SUSP
INTRAMUSCULAR | Status: AC
  Filled 2022-01-26: qty 2

## 2022-01-26 MED ORDER — LISINOPRIL 20 MG PO TABS
20.0000 mg | ORAL_TABLET | Freq: Two times a day (BID) | ORAL | Status: DC
  Administered 2022-01-26 – 2022-01-29 (×6): 20 mg via ORAL
  Filled 2022-01-26 (×6): qty 1

## 2022-01-26 MED ORDER — LATANOPROST 0.005 % OP SOLN
1.0000 [drp] | Freq: Every day | OPHTHALMIC | Status: DC
  Administered 2022-01-26 – 2022-01-28 (×3): 1 [drp] via OPHTHALMIC
  Filled 2022-01-26 (×2): qty 2.5

## 2022-01-26 MED ORDER — SODIUM CHLORIDE 0.9 % IR SOLN
Status: DC | PRN
  Administered 2022-01-26: 3000 mL

## 2022-01-26 MED ORDER — DEXMEDETOMIDINE HCL IN NACL 200 MCG/50ML IV SOLN
INTRAVENOUS | Status: DC | PRN
  Administered 2022-01-26 (×2): 4 ug via INTRAVENOUS
  Administered 2022-01-26: 8 ug via INTRAVENOUS
  Administered 2022-01-26: 4 ug via INTRAVENOUS

## 2022-01-26 MED ORDER — OXYCODONE HCL 5 MG PO TABS
5.0000 mg | ORAL_TABLET | ORAL | Status: DC | PRN
  Administered 2022-01-26 – 2022-01-29 (×12): 5 mg via ORAL
  Filled 2022-01-26 (×12): qty 1

## 2022-01-26 MED ORDER — KETOROLAC TROMETHAMINE 15 MG/ML IJ SOLN: 15.0000 mg | Freq: Once | INTRAMUSCULAR | Status: AC

## 2022-01-26 MED ORDER — PANTOPRAZOLE SODIUM 40 MG PO TBEC
40.0000 mg | DELAYED_RELEASE_TABLET | Freq: Every day | ORAL | Status: DC
  Administered 2022-01-27 – 2022-01-29 (×3): 40 mg via ORAL
  Filled 2022-01-26 (×3): qty 1

## 2022-01-26 MED ORDER — ACETAMINOPHEN 500 MG PO TABS
1000.0000 mg | ORAL_TABLET | Freq: Four times a day (QID) | ORAL | Status: AC
  Administered 2022-01-26 – 2022-01-27 (×3): 1000 mg via ORAL
  Filled 2022-01-26 (×3): qty 2

## 2022-01-26 MED ORDER — DIPHENHYDRAMINE HCL 12.5 MG/5ML PO ELIX: 12.5000 mg | ORAL_SOLUTION | ORAL | Status: DC | PRN

## 2022-01-26 MED ORDER — CEFAZOLIN SODIUM-DEXTROSE 2-4 GM/100ML-% IV SOLN
INTRAVENOUS | Status: AC
  Administered 2022-01-26: 2 g via INTRAVENOUS
  Filled 2022-01-26: qty 100

## 2022-01-26 MED ORDER — METOCLOPRAMIDE HCL 5 MG PO TABS: 5.0000 mg | ORAL_TABLET | Freq: Three times a day (TID) | ORAL | Status: DC | PRN

## 2022-01-26 MED ORDER — OYSTER SHELL CALCIUM/D3 500-5 MG-MCG PO TABS
2.0000 | ORAL_TABLET | Freq: Every day | ORAL | Status: DC
  Administered 2022-01-27 – 2022-01-29 (×3): 2 via ORAL
  Filled 2022-01-26 (×3): qty 2

## 2022-01-26 MED ORDER — HYDROMORPHONE HCL 1 MG/ML IJ SOLN
0.2500 mg | INTRAMUSCULAR | Status: DC | PRN
  Administered 2022-01-26: 0.5 mg via INTRAVENOUS
  Filled 2022-01-26: qty 1

## 2022-01-26 MED ORDER — PROPOFOL 10 MG/ML IV BOLUS
INTRAVENOUS | Status: AC
  Filled 2022-01-26: qty 20

## 2022-01-26 MED ORDER — OXYCODONE HCL 5 MG PO TABS: 2.5000 mg | ORAL_TABLET | ORAL | Status: DC | PRN

## 2022-01-26 MED ORDER — CEFAZOLIN SODIUM-DEXTROSE 2-4 GM/100ML-% IV SOLN
2.0000 g | INTRAVENOUS | Status: AC
  Administered 2022-01-26: 2 g via INTRAVENOUS

## 2022-01-26 MED ORDER — ACETAMINOPHEN 10 MG/ML IV SOLN: 1000.0000 mg | Freq: Once | INTRAVENOUS | Status: DC | PRN

## 2022-01-26 MED ORDER — LACTATED RINGERS IV SOLN: INTRAVENOUS | Status: DC

## 2022-01-26 MED ORDER — BUPIVACAINE LIPOSOME 1.3 % IJ SUSP
INTRAMUSCULAR | Status: AC
  Filled 2022-01-26: qty 20

## 2022-01-26 MED ORDER — TRANEXAMIC ACID 1000 MG/10ML IV SOLN
INTRAVENOUS | Status: AC
  Filled 2022-01-26: qty 10

## 2022-01-26 MED ORDER — MAGNESIUM HYDROXIDE 400 MG/5ML PO SUSP
30.0000 mL | Freq: Every day | ORAL | Status: DC | PRN
  Filled 2022-01-26: qty 30

## 2022-01-26 MED ORDER — TRIAMCINOLONE ACETONIDE 40 MG/ML IJ SUSP
INTRAMUSCULAR | Status: DC | PRN
  Administered 2022-01-26: 80 mg via INTRAMUSCULAR

## 2022-01-26 MED ORDER — 0.9 % SODIUM CHLORIDE (POUR BTL) OPTIME
TOPICAL | Status: DC | PRN
  Administered 2022-01-26: 500 mL

## 2022-01-26 MED ORDER — ONDANSETRON HCL 4 MG/2ML IJ SOLN
4.0000 mg | Freq: Four times a day (QID) | INTRAMUSCULAR | Status: DC | PRN
  Administered 2022-01-27: 4 mg via INTRAVENOUS
  Filled 2022-01-26: qty 2

## 2022-01-26 MED ORDER — BISACODYL 10 MG RE SUPP: 10.0000 mg | Freq: Every day | RECTAL | Status: DC | PRN

## 2022-01-26 MED ORDER — KETOROLAC TROMETHAMINE 30 MG/ML IJ SOLN
INTRAMUSCULAR | Status: DC | PRN
  Administered 2022-01-26: 15 mg via INTRAVENOUS

## 2022-01-26 MED ORDER — KETOROLAC TROMETHAMINE 15 MG/ML IJ SOLN
INTRAMUSCULAR | Status: AC
  Administered 2022-01-26: 15 mg via INTRAVENOUS
  Filled 2022-01-26: qty 1

## 2022-01-26 MED ORDER — PROPOFOL 500 MG/50ML IV EMUL
INTRAVENOUS | Status: DC | PRN
  Administered 2022-01-26: 150 ug/kg/min via INTRAVENOUS

## 2022-01-26 MED ORDER — ONDANSETRON HCL 4 MG/2ML IJ SOLN
INTRAMUSCULAR | Status: DC | PRN
  Administered 2022-01-26: 4 mg via INTRAVENOUS

## 2022-01-26 MED ORDER — TRANEXAMIC ACID 1000 MG/10ML IV SOLN
INTRAVENOUS | Status: DC | PRN
  Administered 2022-01-26: 1000 mg via TOPICAL

## 2022-01-26 MED ORDER — ACETAMINOPHEN 325 MG PO TABS
325.0000 mg | ORAL_TABLET | Freq: Four times a day (QID) | ORAL | Status: DC | PRN
  Administered 2022-01-27 – 2022-01-28 (×2): 650 mg via ORAL
  Filled 2022-01-26 (×2): qty 2

## 2022-01-26 MED ORDER — APIXABAN 2.5 MG PO TABS
2.5000 mg | ORAL_TABLET | Freq: Two times a day (BID) | ORAL | Status: DC
  Administered 2022-01-27 – 2022-01-29 (×5): 2.5 mg via ORAL
  Filled 2022-01-26 (×5): qty 1

## 2022-01-26 MED ORDER — OXYCODONE HCL 5 MG PO TABS
2.5000 mg | ORAL_TABLET | ORAL | Status: DC | PRN
  Administered 2022-01-26: 2.5 mg via ORAL
  Filled 2022-01-26: qty 1

## 2022-01-26 MED ORDER — ONDANSETRON HCL 4 MG PO TABS: 4.0000 mg | ORAL_TABLET | Freq: Four times a day (QID) | ORAL | Status: DC | PRN

## 2022-01-26 MED ORDER — CHLORHEXIDINE GLUCONATE 0.12 % MT SOLN: 15.0000 mL | Freq: Once | OROMUCOSAL | Status: AC

## 2022-01-26 MED ORDER — HYDROCHLOROTHIAZIDE 25 MG PO TABS
25.0000 mg | ORAL_TABLET | Freq: Every day | ORAL | Status: DC
  Administered 2022-01-27 – 2022-01-29 (×3): 25 mg via ORAL
  Filled 2022-01-26 (×3): qty 1

## 2022-01-26 SURGICAL SUPPLY — 59 items
BIT DRILL QUICK REL 1/8 2PK SL (DRILL) IMPLANT
BLADE SAW SAG 25X90X1.19 (BLADE) ×1 IMPLANT
BLADE SURG SZ20 CARB STEEL (BLADE) ×1 IMPLANT
BNDG ELASTIC 6X5.8 VLCR NS LF (GAUZE/BANDAGES/DRESSINGS) ×1 IMPLANT
CEMENT BONE R 1X40 (Cement) ×2 IMPLANT
CEMENT VACUUM MIXING SYSTEM (MISCELLANEOUS) ×1 IMPLANT
CHLORAPREP W/TINT 26 (MISCELLANEOUS) ×1 IMPLANT
COMPONENT PATELLAR VGD 7.8X3 (Joint) IMPLANT
COOLER POLAR GLACIER W/PUMP (MISCELLANEOUS) ×1 IMPLANT
COVER MAYO STAND REUSABLE (DRAPES) ×1 IMPLANT
CUFF TOURN SGL QUICK 24 (TOURNIQUET CUFF)
CUFF TOURN SGL QUICK 34 (TOURNIQUET CUFF)
CUFF TRNQT CYL 24X4X16.5-23 (TOURNIQUET CUFF) IMPLANT
CUFF TRNQT CYL 34X4.125X (TOURNIQUET CUFF) IMPLANT
DRAPE 3/4 80X56 (DRAPES) ×1 IMPLANT
DRAPE IMP U-DRAPE 54X76 (DRAPES) ×1 IMPLANT
DRAPE U-SHAPE 47X51 STRL (DRAPES) ×1 IMPLANT
DRILL QUICK RELEASE 1/8 INCH (DRILL) ×3
DRSG MEPILEX SACRM 8.7X9.8 (GAUZE/BANDAGES/DRESSINGS) IMPLANT
DRSG OPSITE POSTOP 4X10 (GAUZE/BANDAGES/DRESSINGS) ×1 IMPLANT
DRSG OPSITE POSTOP 4X8 (GAUZE/BANDAGES/DRESSINGS) ×1 IMPLANT
ELECT REM PT RETURN 9FT ADLT (ELECTROSURGICAL) ×1
ELECTRODE REM PT RTRN 9FT ADLT (ELECTROSURGICAL) ×1 IMPLANT
GAUZE XEROFORM 1X8 LF (GAUZE/BANDAGES/DRESSINGS) ×1 IMPLANT
GLOVE BIO SURGEON STRL SZ7.5 (GLOVE) ×4 IMPLANT
GLOVE BIO SURGEON STRL SZ8 (GLOVE) ×4 IMPLANT
GLOVE BIOGEL PI IND STRL 8 (GLOVE) ×1 IMPLANT
GLOVE SURG UNDER LTX SZ8 (GLOVE) ×1 IMPLANT
GOWN STRL REUS W/ TWL LRG LVL3 (GOWN DISPOSABLE) ×1 IMPLANT
GOWN STRL REUS W/ TWL XL LVL3 (GOWN DISPOSABLE) ×1 IMPLANT
GOWN STRL REUS W/TWL LRG LVL3 (GOWN DISPOSABLE) ×1
GOWN STRL REUS W/TWL XL LVL3 (GOWN DISPOSABLE) ×1
HOOD PEEL AWAY FLYTE STAYCOOL (MISCELLANEOUS) ×3 IMPLANT
INSERT TIB BEARING 71 10 (Insert) IMPLANT
IV NS IRRIG 3000ML ARTHROMATIC (IV SOLUTION) ×1 IMPLANT
KIT TURNOVER KIT A (KITS) ×1 IMPLANT
KNEE CR FEMORAL RT 65MM (Femur) IMPLANT
MANIFOLD NEPTUNE II (INSTRUMENTS) ×1 IMPLANT
NDL SPNL 20GX3.5 QUINCKE YW (NEEDLE) ×1 IMPLANT
NEEDLE SPNL 20GX3.5 QUINCKE YW (NEEDLE) ×1 IMPLANT
NS IRRIG 1000ML POUR BTL (IV SOLUTION) ×1 IMPLANT
PACK TOTAL KNEE (MISCELLANEOUS) ×1 IMPLANT
PAD WRAPON POLAR KNEE (MISCELLANEOUS) ×1 IMPLANT
PENCIL SMOKE EVACUATOR (MISCELLANEOUS) ×1 IMPLANT
PLATE KNEE TIBIAL 71MM FIXED (Plate) IMPLANT
PULSAVAC PLUS IRRIG FAN TIP (DISPOSABLE) ×1
STAPLER SKIN PROX 35W (STAPLE) ×1 IMPLANT
SUCTION FRAZIER HANDLE 10FR (MISCELLANEOUS) ×1
SUCTION TUBE FRAZIER 10FR DISP (MISCELLANEOUS) ×1 IMPLANT
SUT VIC AB 0 CT1 36 (SUTURE) ×3 IMPLANT
SUT VIC AB 2-0 CT1 27 (SUTURE) ×3
SUT VIC AB 2-0 CT1 TAPERPNT 27 (SUTURE) ×3 IMPLANT
SYR 10ML LL (SYRINGE) ×1 IMPLANT
SYR 20ML LL LF (SYRINGE) ×1 IMPLANT
SYR 30ML LL (SYRINGE) IMPLANT
TIP FAN IRRIG PULSAVAC PLUS (DISPOSABLE) ×1 IMPLANT
TRAP FLUID SMOKE EVACUATOR (MISCELLANEOUS) ×2 IMPLANT
WATER STERILE IRR 500ML POUR (IV SOLUTION) ×1 IMPLANT
WRAPON POLAR PAD KNEE (MISCELLANEOUS) ×1

## 2022-01-26 NOTE — Op Note (Signed)
01/26/2022  10:38 AM  Patient:   Megan Montgomery  Pre-Op Diagnosis:   Degenerative joint disease, right knee.  Post-Op Diagnosis:   Same  Procedure:   Right TKA using all-cemented [employer] Vanguard system with a 65 mm PCR femur, a 71 mm tibial tray with a 10 mm anterior stabilized E-poly insert, and a 34 x 7.8 mm all-poly 3-pegged domed patella.  Surgeon:   Pascal Lux, MD  Assistant:   Elpidio Galea, RNFA-S  Anesthesia:   Spinal  Findings:   As above  Complications:   None  EBL:   15 cc  Fluids:   400 cc crystalloid  UOP:   None  TT:   100 minutes at 300 mmHg  Drains:   None  Closure:   Staples  Implants:   As above  Brief Clinical Note:   The patient is a 76 year old female with a long history of progressively worsening right knee pain. The patient's symptoms have progressed despite medications, activity modification, injections, etc. The patient's history and examination were consistent with advanced degenerative joint disease of the right knee confirmed by plain radiographs. The patient presents at this time for a right total knee arthroplasty.  Procedure:   The patient was brought into the operating room. After adequate spinal anesthesia was obtained, the patient was lain in the supine position before the right lower extremity was prepped with ChloraPrep solution and draped sterilely. Preoperative antibiotics were administered. A timeout was performed to verify the appropriate surgical site before the limb was exsanguinated with an Esmarch and the tourniquet inflated to 300 mmHg.   A standard anterior approach to the knee was made through an approximately 7 inch incision. The incision was carried down through the subcutaneous tissues to expose superficial retinaculum. This was split the length of the incision and the medial flap elevated sufficiently to expose the medial retinaculum. The medial retinaculum was incised, leaving a 3-4 mm cuff of tissue on the patella.  This was extended distally along the medial border of the patellar tendon and proximally through the medial third of the quadriceps tendon. A subtotal fat pad excision was performed before the soft tissues were elevated off the anteromedial and anterolateral aspects of the proximal tibia to the level of the collateral ligaments. The anterior portions of the medial and lateral menisci were removed, as was the anterior cruciate ligament. With the knee flexed to 90, the external tibial guide was positioned and the appropriate proximal tibial cut made. This piece was taken to the back table where it was measured and found to be optimally replicated by a 71 mm component.  Attention was directed to the distal femur. The intramedullary canal was accessed through a 3/8" drill hole. The intramedullary guide was inserted and positioned in order to obtain a neutral flexion gap. The intercondylar block was positioned with care taken to avoid notching the anterior cortex of the femur. The appropriate cut was made. Next, the distal cutting block was placed at 5 of valgus alignment. Using the 9 mm slot, the distal cut was made. The distal femur was measured and found to be optimally replicated by the 65 mm component. The 65 mm 4-in-1 cutting block was positioned and first the posterior, then the posterior chamfer, the anterior chamfer, and finally the anterior cuts were made. At this point, the posterior portions medial and lateral menisci were removed. A trial reduction was performed using the appropriate femoral and tibial components with the 10 mm insert. This demonstrated excellent  stability to varus and valgus stressing both in flexion and extension while permitting full extension. Patella tracking was assessed and found to be excellent. Therefore, the tibial guide position was marked on the proximal tibia. The patella thickness was measured and found to be 20 mm. Therefore, the appropriate cut was made. The patellar  surface was measured and found to be optimally replicated by the 34 mm component. The three peg holes were drilled in place before the trial button was inserted. Patella tracking was assessed and found to be excellent, passing the "no thumb test". The lug holes were drilled into the distal femur before the trial component was removed, leaving only the tibial tray. The keel was then created using the appropriate tower, reamer, and punch.  The bony surfaces were prepared for cementing by irrigating them thoroughly with sterile saline solution via the jet lavage system. A bone plug was fashioned from some of the bone that had been removed previously and used to plug the distal femoral canal. In addition, 20 cc of Exparel diluted out to 60 cc with normal saline and 30 cc of 0.5% Sensorcaine were injected into the postero-medial and postero-lateral aspects of the knee, the medial and lateral gutter regions, and the peri-incisional tissues to help with postoperative analgesia. Meanwhile, the cement was being mixed on the back table. When it was ready, the tibial tray was cemented in first. The excess cement was removed using [employer] fast streamer. Next, the femoral component was impacted into place. Again, the excess cement was removed using [employer] fast streamer. The 10 mm trial insert was positioned and the knee brought into extension while the cement hardened. Finally, the patella was cemented into place and secured using the patellar clamp. Again, the excess cement was removed using [employer] fast streamer. Once the cement had hardened, the knee was placed through a range of motion with the findings as described above. Therefore, the trial insert was removed and, after verifying that no cement had been retained posteriorly, the permanent 10 mm anterior stabilized E-polyethylene insert was positioned and secured using the appropriate key locking mechanism. Again the knee was placed through a range of motion with the findings as  described above.  The wound was copiously irrigated with sterile saline solution using the jet lavage system before the quadriceps tendon and retinacular layer were reapproximated using #0 Vicryl interrupted sutures. The superficial retinacular layer also was closed using a running #0 Vicryl suture. A total of 10 cc of transexemic acid (TXA) was injected intra-articularly before the subcutaneous tissues were closed in several layers using 2-0 Vicryl interrupted sutures. The skin was closed using staples. A sterile honeycomb dressing was applied to the skin before the leg was wrapped with an Ace wrap to accommodate the Polar Care device. The patient was then awakened and returned to the recovery room in satisfactory condition after tolerating the procedure well.

## 2022-01-26 NOTE — Evaluation (Signed)
Physical Therapy Evaluation Patient Details Name: Megan Montgomery MRN: [id number] DOB: [birth year] Today's Date: 01/26/2022  History of Present Illness  Patient is a 76 year old female with degenerative joint disease, right knee. s/p right TKA. 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  Clinical Impression  Patient is agreeable to PT evaluation. She lives alone but has neighbors that will be checking in at discharge. She lives in a single story town home with no steps to enter. At baseline patient ambulates without assistive device.  Today, therapeutic exercises and ROM initiated. The patient required minimal assistance for bed mobility. She was able to stand multiple times and pre-gait activity performed with rolling walker. Patient declined ambulation due to knee pain with activity. Recommend PT follow up to maximize independence in preparation for home discharge. Recommend HHPT.       Recommendations for follow up therapy are one component of a multi-disciplinary discharge planning process, led by the attending physician.  Recommendations may be updated based on patient status, additional functional criteria and insurance authorization.  Follow Up Recommendations Home health PT      Assistance Recommended at Discharge Set up Supervision/Assistance  Patient can return home with the following  Help with stairs or ramp for entrance;Assist for transportation    Equipment Recommendations Rolling walker (2 wheels)  Recommendations for Other Services       Functional Status Assessment Patient has had a recent decline in their functional status and demonstrates the ability to make significant improvements in function in a reasonable and predictable amount of time.     Precautions / Restrictions Precautions Precautions: Fall;Knee Precaution Booklet Issued: Yes (comment) Restrictions Weight Bearing  Restrictions: Yes RLE Weight Bearing: Weight bearing as tolerated      Mobility  Bed Mobility Overal bed mobility: Needs Assistance Bed Mobility: Supine to Sit, Sit to Supine     Supine to sit: Min assist, HOB elevated Sit to supine: Min assist   General bed mobility comments: occasional assistance for RLE support    Transfers Overall transfer level: Needs assistance Equipment used: Rolling walker (2 wheels) Transfers: Sit to/from Stand, Bed to chair/wheelchair/BSC Sit to Stand: Min guard   Step pivot transfers: Min guard       General transfer comment: verbal cues for hand placement and technique for transfers. sit to stand performed from bed, and then stand step performed from bed to [facility] and back to the bed. good safety awareness with occasional cues for RLE positioning with sitting    Ambulation/Gait             Pre-gait activities: using RW for UE support, cues for increased weight acceptance on RLE with standing, patient shifted to left with most weight throughL LLE. no dizziness with standing activity General Gait Details: not attempted due to pain with weight bearing  Stairs            Wheelchair Mobility    Modified Rankin (Stroke Patients Only)       Balance Overall balance assessment: Needs assistance Sitting-balance support: Feet supported Sitting balance-Leahy Scale: Fair     Standing balance support: Bilateral upper extremity supported, Reliant on assistive device for balance Standing balance-Leahy Scale: Fair Standing balance comment: no external support from therapist required                             Pertinent Vitals/Pain Pain  Assessment Pain Assessment: 0-10 Pain Score: 6  Pain Location: R knee Pain Descriptors / Indicators: Discomfort Pain Intervention(s): Limited activity within patient's tolerance, Premedicated before session, Repositioned, Ice applied    Home Living Family/patient expects to be discharged  to:: Private residence Living Arrangements: Alone Available Help at Discharge: Neighbor;Available PRN/intermittently Type of Home:  (town house) Home Access: Level entry       Home Layout: One [place]: Rollator (4 wheels)      Prior Function Prior Level of Function : Independent/Modified Independent             Mobility Comments: ambulation without DME       Hand Dominance        Extremity/Trunk Assessment   Upper Extremity Assessment Upper Extremity Assessment: Overall WFL for tasks assessed    Lower Extremity Assessment Lower Extremity Assessment: RLE deficits/detail RLE Deficits / Details: patient able to initiate SLR, patient able to actviate hip/knee/ankle movement. ROM of knee limited by pain RLE Sensation: WNL       Communication   Communication: No difficulties  Cognition Arousal/Alertness: Awake/alert Behavior During Therapy: WFL for tasks assessed/performed Overall Cognitive Status: Within Functional Limits for tasks assessed                                 General Comments: patient is able to follow all commands without difficulty        General Comments      Exercises Total Joint Exercises Ankle Circles/Pumps: AROM, Strengthening, Both, 10 reps, Supine Quad Sets: AROM, Strengthening, Right, 5 reps, Supine Straight Leg Raises: AROM, AAROM, Strengthening, Right, 5 reps, Supine Goniometric ROM: R knee 10-60 degrees Other Exercises Other Exercises: verbal and tactile cues for exercise technique. written HEP provided   Assessment/Plan    PT Assessment Patient needs continued PT services  PT Problem List Decreased strength;Decreased activity tolerance;Decreased range of motion;Decreased balance;Decreased mobility;Decreased safety awareness;Pain       PT Treatment Interventions DME instruction;Gait training;Stair training;Functional mobility training;Therapeutic activities;Neuromuscular re-education;Balance  training;Therapeutic exercise;Cognitive remediation;Patient/family education    PT Goals (Current goals can be found in the Care Plan section)  Acute Rehab PT Goals Patient Stated Goal: to return home PT Goal Formulation: With patient Time For Goal Achievement: 02/09/22 Potential to Achieve Goals: Good    Frequency BID     Co-evaluation               AM-PAC PT "6 Clicks" Mobility  Outcome Measure Help needed turning from your back to your side while in a flat bed without using bedrails?: A Little Help needed moving from lying on your back to sitting on the side of a flat bed without using bedrails?: A Little Help needed moving to and from a bed to a chair (including a wheelchair)?: A Little Help needed standing up from a chair using your arms (e.g., wheelchair or bedside chair)?: A Little Help needed to walk in hospital room?: A Little Help needed climbing 3-5 steps with a railing? : A Little 6 Click Score: 18    End of Session Equipment Utilized During Treatment: Gait belt Activity Tolerance: Patient tolerated treatment well Patient left: in bed;with call bell/phone within reach;with bed alarm set;with SCD's reapplied (polar care reapplied R knee) Nurse Communication: Mobility status PT Visit Diagnosis: Muscle weakness (generalized) (M62.81);Other abnormalities of gait and mobility (R26.89)    Time: 0254-2706 PT Time Calculation (min) (ACUTE ONLY): 31 min  Charges:   PT Evaluation $PT Eval Low Complexity: 1 Low PT Treatments $Therapeutic Exercise: 8-22 mins        Minna Merritts, PT, MPT   Percell Locus 01/26/2022, 3:35 PM

## 2022-01-26 NOTE — Plan of Care (Signed)

## 2022-01-26 NOTE — Anesthesia Procedure Notes (Signed)
Spinal  Patient location during procedure: OR Start time: 01/26/2022 7:35 AM End time: 01/26/2022 7:45 AM Reason for block: surgical anesthesia Staffing Performed: anesthesiologist  Anesthesiologist: Iran Ouch, MD Performed by: Iran Ouch, MD Authorized by: Iran Ouch, MD   Preanesthetic Checklist Completed: patient identified, IV checked, site marked, risks and benefits discussed, surgical consent, monitors and equipment checked, pre-op evaluation and timeout performed Spinal Block Patient position: sitting Prep: DuraPrep Patient monitoring: heart rate, cardiac monitor, continuous pulse ox and blood pressure Approach: midline Location: L3-4 Injection technique: single-shot Needle Needle type: Sprotte  Needle gauge: 24 G Needle length: 9 cm Assessment Sensory level: T10 Events: CSF return Additional Notes CRNA student present and had first attempt. No paresthesia noted

## 2022-01-26 NOTE — H&P (Signed)
History of Present Illness: Megan Montgomery is a 76 y.o.female who is being seen in consultation at the request of Dr. Leim Fabry for right knee pain and swelling. The symptoms began several months ago and developed without any specific cause or injury. The patient is now 2 years status post a right knee arthroscopic debridement with partial medial meniscectomy and subchondroplasty of her medial tibial plateau provided moderate relief of her symptoms for several years before her symptoms began to recur. She saw Reche Dixon, PA-C, who performed a steroid injection which she states provided moderate relief of her symptoms, but only lasted a few weeks before her symptoms recurred. She was then sent for an MRI scan and referred to Dr. Posey Pronto for further evaluation and treatment. The various surgical and nonsurgical options were discussed, and the patient elected to accept a referral to me to discuss the potential pros and cons of a total knee arthroplasty. She reports no pain in the knee on today's visit, but does note that she can experience mild to moderate pain in her knee with any prolonged standing or ambulation as well as with certain twisting and squatting activities. The pain is located along the medial aspect of the knee. The pain is described as aching, dull, stabbing, and throbbing. The symptoms are aggravated with normal daily activities, with sleeping, using stairs, at higher levels of activity, walking, standing, exercising, and activity in general. She also describes a feeling as though the knee wants to give way on her. She has associated swelling and no deformity. She has tried acetaminophen, over-the-counter medications, anti-inflammatories, steroid injections, a home exercise program, and ice with limited benefit. The patient denies any numbness or paresthesias down her leg to her foot.  Current Outpatient Medications: acetaminophen (TYLENOL) 500 MG tablet Take 1,000 mg by mouth  amLODIPine (NORVASC)  5 MG tablet Take 1 tablet (5 mg total) by mouth 2 (two) times daily (Patient taking differently: Take 5 mg by mouth once daily) 180 tablet 3  betamethasone dipropionate (DIPROSONE) 0.05 % cream Apply to affected area on left inner arm and left buttocks twice a day for 2 weeks 45 g 3  calcium carbonate-vitamin D3 (OS-CAL 500+D) 500 mg(1,'250mg'$ ) -200 unit tablet Take 1 tablet by mouth 2 (two) times daily with meals. 60 tablet 0  diphenhydrAMINE (BENADRYL) 25 mg tablet Take 25 mg by mouth every 6 (six) hours as needed for itching.  docusate (COLACE) 100 MG capsule Take 100 mg by mouth 2 (two) times daily  erythromycin (ROMYCIN) ophthalmic ointment  famotidine (PEPCID) 10 MG tablet Take 10 mg by mouth 2 (two) times daily  glucosamine/chondroitin/C/Mang (GLUCOSAMINE 1500 COMPLEX ORAL) Take 1 tablet by mouth once daily  hydroCHLOROthiazide (HYDRODIURIL) 25 MG tablet TAKE 1 TABLET BY MOUTH EVERY DAY 90 tablet 3  HYDROcodone-acetaminophen (NORCO) 5-325 mg tablet Take 1 tablet by mouth every 6 (six) hours as needed for Pain for up to 30 doses 30 tablet 0  hydrocortisone 2.5 % ointment  ibandronate (BONIVA) 150 mg tablet TAKE 1 TABLET BY MOUTH EVERY 30 DAYS WITH A FULL GLASS OF WATER - DO NOT LIE DOWN FOR THE NEXT 60 MINUTES 3 tablet 0  latanoprost (XALATAN) 0.005 % ophthalmic solution INSTILL 1 DROP INTO RIGHT EYE AT NIGHT  lidocaine (LIDODERM) 5 % patch Place 1 patch onto the skin daily Apply patch to the most painful area for up to 12 hours in a 24 hour period. 15 patch 2  melatonin 5 mg Tab Take by mouth  meloxicam (  MOBIC) 7.5 MG tablet TAKE 2 TABLETS BY MOUTH ONCE EVERY DAY 60 tablet 3  multivit,iron,minerals/lutein (CENTRUM SILVER ULTRA WOMEN'S ORAL) Take 1 capsule by mouth once daily  mupirocin (BACTROBAN) 2 % ointment mupirocin 2 % topical ointment APPLY TOPICALLY 2 (TWO) TIMES DAILY FOR 7 DAYS OR UNTIL CLEARED  omeprazole (PRILOSEC) 40 MG DR capsule TAKE 1 CAPSULE BY MOUTH EVERY DAY 90 capsule 3   ondansetron (ZOFRAN-ODT) 4 MG disintegrating tablet DISOLVE 1 TABLET BY MOUTH EVERY 8 HOURS AS NEEDED FOR NAUSEA OR VOMITING  propylene glycoL (SYSTANE COMPLETE) 0.6 % ophthalmic drops Apply to eye  simvastatin (ZOCOR) 10 MG tablet TAKE 1 TABLET BY MOUTH EVERY DAY AT NIGHT 90 tablet 3  tiZANidine (ZANAFLEX) 4 MG tablet Take 1 tablet (4 mg total) by mouth 3 (three) times daily as needed 30 tablet 0  lisinopriL (ZESTRIL) 20 MG tablet Take 1 tablet (20 mg total) by mouth 2 (two) times daily 180 tablet 4   Allergies:  Venom-Honey Bee Swelling  Bee Venom Protein (Honey Bee) Rash  Iodinated Contrast Media Rash  Penicillin Swelling  Septra [Sulfamethoxazole-Trimethoprim] Rash    Past Medical History:  COVID-19 09/25/2020  Degenerative disc disease, thoracic  GERD (gastroesophageal reflux disease)  History of cellulitis 05/17/2017  Hx of cervical cancer 1988  Hypertension  Osteoarthritis (Neck)  PAT (paroxysmal atrial tachycardia) (CMS-HCC)  SCC (squamous cell carcinoma)  Skin cancer (neck)    Past Surgical History:  Shoulder surgery Left 2014 (partial removal of AC joint because OA)  KNEE ARTHROSCOPY Right 11/25/2019 (Right knee arthroscopically assisted subchondroplasty of medial tibial plateau)  ABDOMINAL HYSTERECTOMY  BREAST BIOPSY NEG BX/CLIP RIGHT Right  CATARACT EXTRACTION W/ INTRAOCULAR LENS IMPLANT, BILATERAL  HYSTERECTOMY with oopherectomy Right  TONSILLECTOMY   Family History:  Breast cancer Mother  Skin cancer Mother  Kidney cancer Mother  Prostate cancer Father  Lung cancer Father  No Known Problems Daughter  No Known Problems Son   Social History:   Socioeconomic History:  Marital status: Divorced  Number of children: 2  Years of education: 16  Highest education level: Bachelor's degree (e.g., BA, AB, BS)  Tobacco Use  Smoking status: Former  Packs/day: 1.50  Years: 12.00  Pack years: 18.00  Types: Cigarettes  Quit date: 1992  Years since quitting:  31.6  Smokeless tobacco: Never  Vaping Use  Vaping Use: Never used  Substance and Sexual Activity  Alcohol use: Yes  Alcohol/week: 3.0 standard drinks  Types: 3 Shots of liquor per week  Comment: 7 (1 standard drink = 0.6 oz pure alcohol  Drug use: No  Sexual activity: Not Currently  Partners: Male  Social History Narrative  12/14/2021  Military Service: Therapist, art.  Likes/Enjoys/What fills your day?: Narrative  Home: One Story  Your Bedrooms is on: First Level  Fewest Steps to enter the home: 0  Other persons in the home: lives alone  Pets: Retail banker you use daily: Comfort type toilet seats, shower bench  Medical equipment available in the home: [place]: no significant dental problems. Last screening Date: April 2023 Screening is: Up to date. [place]  Vision: Denies any issues with Vision","Wears Reading glasses","Prescription Glasses","Contact Lenses"}.. Last screening Date: July 2023 Screening is: Up to date. [place]  Hearing: Denies any issues in Hearing .  Dermatology: Denies any areas of concern on skin. Last screening Date: May 2023 Screening is: Up to date. [facility]    Social Determinants of Health:   Emergency planning/management officer  Strain: Low Risk  Difficulty of Paying Living Expenses: Not hard at all  Food Insecurity: No Food Insecurity  Worried About Charity fundraiser in the Last Year: Never true  Ran Out of Food in the Last Year: Never true  Transportation Needs: No Transportation Needs  Lack of Transportation (Medical): No  Lack of Transportation (Non-Medical): No   Review of Systems:  A comprehensive 14 point ROS was performed, reviewed, and the pertinent orthopaedic findings are documented in the HPI.  Physical Exam: Vitals:  12/29/21 0823  BP: 126/74  Weight: 79.6 kg (175 lb 6.4 oz)  Height: 162.6 cm ('5\' 4"'$ )  PainSc: 0-No pain  PainLoc: Knee   General/Constitutional: The patient appears to be well-nourished,  well-developed, and in no acute distress. Neuro/Psych: Normal mood and affect, oriented to person, place and time. Eyes: Non-icteric. Pupils are equal, round, and reactive to light, and exhibit synchronous movement. Lymphatic: No palpable adenopathy. Respiratory: Lungs clear to auscultation, Normal chest excursion, No wheezes, and Non-labored breathing Cardiovascular: Regular rate and rhythm. No murmurs. and No edema, swelling or tenderness, except as noted in detailed exam. Vascular: No edema, swelling or tenderness, except as noted in detailed exam. Integumentary: No impressive skin lesions present, except as noted in detailed exam. Musculoskeletal: Unremarkable, except as noted in detailed exam.  Right knee exam: GAIT: normal and uses no assistive devices. ALIGNMENT: normal SKIN: Well-healed arthroscopic portal sites, otherwise unremarkable. SWELLING: mild EFFUSION: trace WARMTH: no warmth TENDERNESS: Moderate-severe tenderness along medial joint line, moderate tenderness along lateral joint line ROM: 0 to 125 degrees with mild pain in maximal flexion McMURRAY'S: positive PATELLOFEMORAL: normal tracking with no peri-patellar tenderness and negative apprehension sign CREPITUS: Mild patellofemoral crepitance LACHMAN'S: negative PIVOT SHIFT: negative ANTERIOR DRAWER: negative POSTERIOR DRAWER: negative VARUS/VALGUS: stable  She is neurovascularly intact to the right lower extremity and foot, although she does exhibit some chronic lymphedema of the right lower extremity and foot due to prior radical dissection of lymph nodes in her right groin region.  Knee Imaging: Recent AP weightbearing of both knees, as well as lateral and merchant views of the right knee are available for review. These films demonstrate moderate degenerative changes, primarily involving the medial compartment with 60% medial joint space narrowing. Overall alignment is neutral. No fractures, lytic lesions, or  abnormal calcifications are noted.  Knee Imaging, external: Right knee: A recent MRI scan of the right knee is available for review and has been reviewed by myself. By report, the study demonstrates evidence of advanced degenerative changes involving the medial compartment, as well as moderate progressive degenerative changes involving the lateral compartment. Lesser more stable degenerative changes of the patellofemoral compartment also are noted. There is evidence of postsurgical changes consistent with prior partial medial meniscectomy without any evidence for new meniscal tears. Both the films report reviewed by myself and discussed with the patient.  Assessment:  Primary osteoarthritis of right knee.   Plan: The treatment options were discussed with the patient. In addition, patient educational materials were provided regarding the diagnosis and treatment options. The patient is quite frustrated by her symptoms and function limitations, and is ready to consider more aggressive treatment options. Therefore, I have recommended a surgical procedure, specifically a right total knee arthroplasty. The procedure was discussed with the patient, as were the potential risks (including bleeding, infection, nerve and/or blood vessel injury, persistent or recurrent pain, loosening and/or failure of the components, dislocation, need for further surgery, blood clots, strokes, heart attacks and/or arhythmias, pneumonia,  etc.) and benefits. The patient states his/her understanding and wishes to proceed. All of the patient's questions and concerns were answered. She can call any time with further concerns. She will follow up post-surgery, routine.    H&P reviewed and patient re-examined. No changes.

## 2022-01-26 NOTE — Anesthesia Preprocedure Evaluation (Addendum)
Anesthesia Evaluation  Patient identified by MRN, date of birth, ID band Patient awake    Reviewed: Allergy & Precautions, H&P , NPO status , Patient's Chart, lab work & pertinent test results  History of Anesthesia Complications Negative for: history of anesthetic complications  Airway Mallampati: III  TM Distance: <3 FB Neck ROM: limited   Comment: Small chin Dental  (+) Teeth Intact, Implants   Pulmonary neg COPD, former smoker,    breath sounds clear to auscultation       Cardiovascular Exercise Tolerance: Good hypertension, On Medications and Pt. on medications (-) angina(-) Past MI, (-) Cardiac Stents and (-) CHF + dysrhythmias (h/o paroxysmal atrial tachycardia, described as occasional palpitations that resolved on their own. No AFib. No issues in years)  Rhythm:regular Rate:Normal     Neuro/Psych negative neurological ROS  negative psych ROS   GI/Hepatic Neg liver ROS, GERD  Controlled and Medicated,  Endo/Other  negative endocrine ROS  Renal/GU      Musculoskeletal  (+) Arthritis ,   Abdominal Normal abdominal exam  (+)   Peds  Hematology  (+) Blood dyscrasia, anemia ,   Anesthesia Other Findings Past Medical History: 1988: Cervical cancer ([place]) No date: Degenerative disc disease, thoracic No date: GERD (gastroesophageal reflux disease) No date: Hypertension No date: PAT (paroxysmal atrial tachycardia) (HCC) No date: Skin cancer     Comment:  Neck  Past Surgical History: No date: ABDOMINAL HYSTERECTOMY No date: BREAST BIOPSY; Right     Comment:  neg bx/clip No date: CATARACT EXTRACTION W/ INTRAOCULAR LENS  IMPLANT, BILATERAL;  Bilateral No date: EYE SURGERY No date: OOPHORECTOMY; Right 2014: SHOULDER SURGERY; Left     Comment:  partial removal of AC joint d/t osteoarthritis No date: TONSILLECTOMY     Reproductive/Obstetrics negative OB ROS                            Anesthesia Physical  Anesthesia Plan  ASA: II  Anesthesia Plan: Spinal   Post-op Pain Management: Regional block*, Tylenol PO (pre-op)* and Ofirmev IV (intra-op)*   Induction: Intravenous  PONV Risk Score and Plan: Ondansetron, Dexamethasone and Treatment may vary due to age or medical condition  Airway Management Planned: Natural Airway  Additional Equipment:   Intra-op Plan:   Post-operative Plan:   Informed Consent: I have reviewed the patients History and Physical, chart, labs and discussed the procedure including the risks, benefits and alternatives for the proposed anesthesia with the patient or authorized representative who has indicated his/her understanding and acceptance.     Dental Advisory Given  Plan Discussed with: Anesthesiologist, CRNA and Surgeon  Anesthesia Plan Comments:        Anesthesia Quick Evaluation

## 2022-01-26 NOTE — Transfer of Care (Signed)
Immediate Anesthesia Transfer of Care Note  Patient: Megan Montgomery  Procedure(s) Performed: TOTAL KNEE ARTHROPLASTY - RNFA (Right: Knee)  Patient Location: PACU  Anesthesia Type:spinal Level of Consciousness: awake  Airway & Oxygen Therapy: Patient Spontanous Breathing  Post-op Assessment: Report given to RN and Post -op Vital signs reviewed and stable  Post vital signs: Reviewed and stable  Last Vitals:  Vitals Value Taken Time  BP 109/54 01/26/22 1018  Temp    Pulse 72 01/26/22 1023  Resp 13 01/26/22 1023  SpO2 97 % 01/26/22 1023  Vitals shown include unvalidated device data.  Last Pain:  Vitals:   01/26/22 0622  TempSrc: Temporal  PainSc: 1          Complications: No notable events documented.

## 2022-01-27 ENCOUNTER — Encounter: Payer: Self-pay | Admitting: Surgery

## 2022-01-27 DIAGNOSIS — M1711 Unilateral primary osteoarthritis, right knee: Secondary | ICD-10-CM | POA: Diagnosis not present

## 2022-01-27 LAB — CBC
HCT: 31 % — ABNORMAL LOW (ref 36.0–46.0)
Hemoglobin: 10.5 g/dL — ABNORMAL LOW (ref 12.0–15.0)
MCH: 29.9 pg (ref 26.0–34.0)
MCHC: 33.9 g/dL (ref 30.0–36.0)
MCV: 88.3 fL (ref 80.0–100.0)
Platelets: 348 10*3/uL (ref 150–400)
RBC: 3.51 MIL/uL — ABNORMAL LOW (ref 3.87–5.11)
RDW: 12.6 % (ref 11.5–15.5)
WBC: 14.3 10*3/uL — ABNORMAL HIGH (ref 4.0–10.5)
nRBC: 0 % (ref 0.0–0.2)

## 2022-01-27 LAB — BASIC METABOLIC PANEL
Anion gap: 9 (ref 5–15)
BUN: 25 mg/dL — ABNORMAL HIGH (ref 8–23)
CO2: 27 mmol/L (ref 22–32)
Calcium: 9 mg/dL (ref 8.9–10.3)
Chloride: 92 mmol/L — ABNORMAL LOW (ref 98–111)
Creatinine, Ser: 0.76 mg/dL (ref 0.44–1.00)
GFR, Estimated: >60 mL/min (ref >60)
Glucose, Bld: 140 mg/dL — ABNORMAL HIGH (ref 70–99)
Potassium: 4.1 mmol/L (ref 3.5–5.1)
Sodium: 128 mmol/L — ABNORMAL LOW (ref 135–145)

## 2022-01-27 NOTE — Progress Notes (Signed)
Spoke with the patient  She lives alone in a town home She has neighbors and friends that will help but doe snot have someone that will stay with her She needs a rolling walker and 3i n 1 it will be delivered to the room prior to [place] is set up with [facility] services

## 2022-01-27 NOTE — Progress Notes (Signed)
Physical Therapy Treatment Patient Details Name: Megan Montgomery MRN: [id number] DOB: 25-Jan-1946 Today's Date: 01/27/2022   History of Present Illness Patient is a 76 year old female with degenerative joint disease, right knee. s/p right TKA. 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   PT Comments    Patient was agreeable to PT. Gait training progressed into hallway with occasional cues for sequencing and standing closer to rolling walker for support. Ambulation distance increased this afternoon compared to this morning. Further gait distance and overall activity tolerance limited by pain (5/10) and nausea. Recommend to continue PT to maximize independence in preparation for home discharge.    Recommendations for follow up therapy are one component of a multi-disciplinary discharge planning process, led by the attending physician.  Recommendations may be updated based on patient status, additional functional criteria and insurance authorization.  Follow Up Recommendations  Home health PT     Assistance Recommended at Discharge Set up Supervision/Assistance  Patient can return home with the following Help with stairs or ramp for entrance;Assist for transportation   Equipment Recommendations  Rolling walker (2 wheels)    Recommendations for Other Services       Precautions / Restrictions Precautions Precautions: Fall;Knee Restrictions Weight Bearing Restrictions: Yes RLE Weight Bearing: Weight bearing as tolerated     Mobility  Bed Mobility Overal bed mobility: Needs Assistance Bed Mobility: Supine to Sit, Sit to Supine     Supine to sit: Supervision, HOB elevated Sit to supine: Min assist   General bed mobility comments: assistance only required for intermittent RLE support. cues for technique    Transfers Overall transfer level: Needs assistance Equipment used: Rolling walker (2  wheels) Transfers: Sit to/from Stand Sit to Stand: Min guard           General transfer comment: verbal cues for technique with good safety awareness demonstrated    Ambulation/Gait Ambulation/Gait assistance: Min guard Gait Distance (Feet): 75 Feet Assistive device: Rolling walker (2 wheels) Gait Pattern/deviations: Step-to pattern, Step-through pattern, Decreased stride length Gait velocity: decreased     General Gait Details: decreased heel strike with RLE. cues for sequencing and standing closer to rolling walker for support.   Stairs             Wheelchair Mobility    Modified Rankin (Stroke Patients Only)       Balance Overall balance assessment: Needs assistance Sitting-balance support: Feet supported Sitting balance-Leahy Scale: Fair     Standing balance support: Bilateral upper extremity supported, Reliant on assistive device for balance Standing balance-Leahy Scale: Fair Standing balance comment: no external support from therapist required                            Cognition Arousal/Alertness: Awake/alert Behavior During Therapy: WFL for tasks assessed/performed Overall Cognitive Status: Within Functional Limits for tasks assessed                                          Exercises Total Joint Exercises Goniometric ROM: R knee 10-76 degrees Other Exercises Other Exercises: emphasis on quad sets and ankle pumps while sitting up in chair    General Comments General comments (skin integrity, edema, etc.): alerted nurse that patient requested a low dosage pain medication and nausea medication  Pertinent Vitals/Pain Pain Assessment Pain Assessment: 0-10 Pain Score: 5  Faces Pain Scale: Hurts little more Pain Location: R knee Pain Descriptors / Indicators: Discomfort Pain Intervention(s): Limited activity within patient's tolerance, Monitored during session, Ice applied, Patient requesting pain meds-RN  notified (polar care re-applied at end of session)    Home Living                          Prior Function            PT Goals (current goals can now be found in the care plan section) Acute Rehab PT Goals Patient Stated Goal: to return home PT Goal Formulation: With patient Time For Goal Achievement: 02/09/22 Potential to Achieve Goals: Good Progress towards PT goals: Progressing toward goals    Frequency    BID      PT Plan Current plan remains appropriate    Co-evaluation              AM-PAC PT "6 Clicks" Mobility   Outcome Measure  Help needed turning from your back to your side while in a flat bed without using bedrails?: A Little Help needed moving from lying on your back to sitting on the side of a flat bed without using bedrails?: A Little Help needed moving to and from a bed to a chair (including a wheelchair)?: A Little Help needed standing up from a chair using your arms (e.g., wheelchair or bedside chair)?: A Little Help needed to walk in hospital room?: A Little Help needed climbing 3-5 steps with a railing? : A Little 6 Click Score: 18    End of Session Equipment Utilized During Treatment: Gait belt Activity Tolerance: Patient limited by pain (limited by nausea) Patient left: with call bell/phone within reach;in bed;with bed alarm set (polar care in place) Nurse Communication: Mobility status PT Visit Diagnosis: Muscle weakness (generalized) (M62.81);Other abnormalities of gait and mobility (R26.89)     Time: 9977-4142 PT Time Calculation (min) (ACUTE ONLY): 16 min  Charges:  $Gait Training: 8-22 mins $Therapeutic Activity: 8-22 mins                     Megan Montgomery, PT, MPT    Percell Locus 01/27/2022, 2:37 PM

## 2022-01-27 NOTE — Care Management Obs Status (Signed)
[facility] NOTIFICATION   Patient Details  Name: Megan Montgomery MRN: [id number] Date of Birth: 1946-01-23   Medicare Observation Status Notification Given:  Yes    Conception Oms, RN 01/27/2022, 10:51 AM

## 2022-01-27 NOTE — Progress Notes (Signed)
Physical Therapy Treatment Patient Details Name: Quynn Vilchis MRN: [id number] DOB: 12-13-1945 Today's Date: 01/27/2022   History of Present Illness Patient is a 76 year old female with degenerative joint disease, right knee. s/p right TKA. 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    PT Comments    The patient is making progress with increased activity tolerance this session. She ambulated a short distance in the room with rolling walker and occasional cues for sequencing of LE and positioning of rolling walker. Activity tolerance limited by pain. The patient states that her daughter wants her to go to short term rehab, but the patient wants to return home. PT will follow up again in the afternoon for BID session in attempts to maximize functional independence in preparation for home discharge.    Recommendations for follow up therapy are one component of a multi-disciplinary discharge planning process, led by the attending physician.  Recommendations may be updated based on patient status, additional functional criteria and insurance authorization.  Follow Up Recommendations  Home health PT     Assistance Recommended at Discharge Set up Supervision/Assistance  Patient can return home with the following Help with stairs or ramp for entrance;Assist for transportation   Equipment Recommendations  Rolling walker (2 wheels)    Recommendations for Other Services       Precautions / Restrictions Precautions Precautions: Fall;Knee Restrictions Weight Bearing Restrictions: Yes RLE Weight Bearing: Weight bearing as tolerated     Mobility  Bed Mobility Overal bed mobility: Needs Assistance Bed Mobility: Supine to Sit     Supine to sit: Min guard, HOB elevated     General bed mobility comments: increased time with no physical assistance required for bed mobility.    Transfers Overall transfer  level: Needs assistance Equipment used: Rolling walker (2 wheels) Transfers: Sit to/from Stand Sit to Stand: Min guard           General transfer comment: verbal cues for hand placement with standing and cues for RLE positioning with sitting.    Ambulation/Gait Ambulation/Gait assistance: Min guard Gait Distance (Feet): 35 Feet Assistive device: Rolling walker (2 wheels) Gait Pattern/deviations: Step-to pattern, Decreased stride length, Decreased stance time - right, Antalgic Gait velocity: decreased     General Gait Details: verbal cues for sequencing of BLE and rolling walker, and to keep rolling walker closer to base of support. patient is limited by pain with weight bearing that increased with taking a bigger step length with RLE.   Stairs             Wheelchair Mobility    Modified Rankin (Stroke Patients Only)       Balance Overall balance assessment: Needs assistance Sitting-balance support: Feet supported Sitting balance-Leahy Scale: Fair     Standing balance support: Bilateral upper extremity supported, Reliant on assistive device for balance Standing balance-Leahy Scale: Fair Standing balance comment: no external support from therapist required                            Cognition Arousal/Alertness: Awake/alert Behavior During Therapy: WFL for tasks assessed/performed Overall Cognitive Status: Within Functional Limits for tasks assessed                                          Exercises  Total Joint Exercises Goniometric ROM: R knee 10-76 degrees Other Exercises Other Exercises: emphasis on quad sets and ankle pumps while sitting up in chair    General Comments        Pertinent Vitals/Pain Pain Assessment Pain Assessment: Faces Faces Pain Scale: Hurts little more Pain Location: R knee Pain Descriptors / Indicators: Discomfort Pain Intervention(s): Limited activity within patient's tolerance, Premedicated  before session, Repositioned    Home Living                          Prior Function            PT Goals (current goals can now be found in the care plan section) Acute Rehab PT Goals Patient Stated Goal: to return home PT Goal Formulation: With patient Time For Goal Achievement: 02/09/22 Potential to Achieve Goals: Good Progress towards PT goals: Progressing toward goals    Frequency    BID      PT Plan Current plan remains appropriate    Co-evaluation              AM-PAC PT "6 Clicks" Mobility   Outcome Measure  Help needed turning from your back to your side while in a flat bed without using bedrails?: A Little Help needed moving from lying on your back to sitting on the side of a flat bed without using bedrails?: A Little Help needed moving to and from a bed to a chair (including a wheelchair)?: A Little Help needed standing up from a chair using your arms (e.g., wheelchair or bedside chair)?: A Little Help needed to walk in hospital room?: A Little Help needed climbing 3-5 steps with a railing? : A Little 6 Click Score: 18    End of Session Equipment Utilized During Treatment: Gait belt Activity Tolerance: Patient tolerated treatment well Patient left: in chair;with call bell/phone within reach Nurse Communication: Mobility status PT Visit Diagnosis: Muscle weakness (generalized) (M62.81);Other abnormalities of gait and mobility (R26.89)     Time: 6384-6659 PT Time Calculation (min) (ACUTE ONLY): 27 min  Charges:  $Gait Training: 8-22 mins $Therapeutic Activity: 8-22 mins                     Minna Merritts, PT, MPT    Percell Locus 01/27/2022, 11:52 AM

## 2022-01-27 NOTE — Anesthesia Postprocedure Evaluation (Signed)
Anesthesia Post Note  Patient: Megan Montgomery  Procedure(s) Performed: TOTAL KNEE ARTHROPLASTY - RNFA (Right: Knee)  Patient location during evaluation: PACU Anesthesia Type: Spinal Level of consciousness: awake and alert Pain management: pain level controlled Vital Signs Assessment: post-procedure vital signs reviewed and stable Respiratory status: spontaneous breathing, nonlabored ventilation and respiratory function stable Cardiovascular status: blood pressure returned to baseline and stable Postop Assessment: no apparent nausea or vomiting Anesthetic complications: no   No notable events documented.   Last Vitals:  Vitals:   01/27/22 0030 01/27/22 0747  BP: (!) 140/66 (!) 140/67  Pulse: 80 76  Resp: 16 16  Temp: 36.7 C 36.8 C  SpO2: 95% 95%    Last Pain:  Vitals:   01/27/22 0539  TempSrc:   PainSc: 3                  [place] Ouch

## 2022-01-27 NOTE — Progress Notes (Signed)
  Subjective: 1 Day Post-Op Procedure(s) (LRB): TOTAL KNEE ARTHROPLASTY - RNFA (Right) Patient reports pain as moderate.   Patient is well, and has had no acute complaints or problems Plan is to go Home after hospital stay. Negative for chest pain and shortness of breath Fever: no Gastrointestinal:Negative for nausea and vomiting Patient reports that she is passing gas this morning.  Objective: Vital signs in last 24 hours: Temp:  [97.6 F (36.4 C)-99.3 F (37.4 C)] 98.1 F (36.7 C) (09/21 0030) Pulse Rate:  [68-86] 80 (09/21 0030) Resp:  [12-18] 16 (09/21 0030) BP: (109-161)/(54-76) 140/66 (09/21 0030) SpO2:  [94 %-98 %] 95 % (09/21 0030)  Intake/Output from previous day:  Intake/Output Summary (Last 24 hours) at 01/27/2022 0726 Last data filed at 01/27/2022 0317 Gross per 24 hour  Intake 540.06 ml  Output 15 ml  Net 525.06 ml    Intake/Output this shift: No intake/output data recorded.  Labs: Recent Labs    01/27/22 0419  HGB 10.5*   Recent Labs    01/27/22 0419  WBC 14.3*  RBC 3.51*  HCT 31.0*  PLT 348   Recent Labs    01/27/22 0419  NA 128*  K 4.1  CL 92*  CO2 27  BUN 25*  CREATININE 0.76  GLUCOSE 140*  CALCIUM 9.0   No results for input(s): "LABPT", "INR" in the last 72 hours.   EXAM General - Patient is Alert, Appropriate, and Oriented Extremity - ABD soft Neurovascular intact Dorsiflexion/Plantar flexion intact Incision: scant drainage No cellulitis present Compartment soft Dressing/Incision - Mild bloody drainage noted to the right knee honeycomb dressing. Motor Function - intact, moving foot and toes well on exam.  ABdomen soft with intact bowel sounds.  Past Medical History:  Diagnosis Date   Cervical cancer ([place]) 1988   Degenerative disc disease, thoracic    Dry eye syndrome    Family history of adverse reaction to anesthesia    dad-hard to intubate   GERD (gastroesophageal reflux disease)    Glaucoma    Headache    h/o  migraines   Hyperlipidemia    Hypertension    PAT (paroxysmal atrial tachycardia) ([facility])    Skin cancer    Neck    Assessment/Plan: 1 Day Post-Op Procedure(s) (LRB): TOTAL KNEE ARTHROPLASTY - RNFA (Right) Principal Problem:   Status post total knee replacement using cement, right  Estimated body mass index is 28.41 kg/m as calculated from the following:   Height as of this encounter: '5\' 6"'$  (1.676 m).   Weight as of this encounter: 79.8 kg. Advance diet Up with therapy D/C IV fluids when tolerating po intake.  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 WBC 14.3, Hg 10.5. Up with therapy today, patient lives at home by herself. Plan will be for discharge home tomorrow with HHPT pending her progress with therapy today.  DVT Prophylaxis -  Eliquis and SCDs Weight-Bearing as tolerated to right leg  J. Cameron Proud, PA-C [facility] Orthopaedic Surgery 01/27/2022, 7:26 AM

## 2022-01-27 NOTE — Plan of Care (Signed)

## 2022-01-27 NOTE — Progress Notes (Cosign Needed)
Patient is not able to walk the distance required to go the bathroom, or he/she is unable to safely negotiate stairs required to access the bathroom.  A 3in1 BSC will alleviate this problem  

## 2022-01-28 DIAGNOSIS — M1711 Unilateral primary osteoarthritis, right knee: Secondary | ICD-10-CM | POA: Diagnosis not present

## 2022-01-28 LAB — CBC
HCT: 30.3 % — ABNORMAL LOW (ref 36.0–46.0)
Hemoglobin: 10.6 g/dL — ABNORMAL LOW (ref 12.0–15.0)
MCH: 30.3 pg (ref 26.0–34.0)
MCHC: 35 g/dL (ref 30.0–36.0)
MCV: 86.6 fL (ref 80.0–100.0)
Platelets: 376 10*3/uL (ref 150–400)
RBC: 3.5 MIL/uL — ABNORMAL LOW (ref 3.87–5.11)
RDW: 12.5 % (ref 11.5–15.5)
WBC: 11.9 10*3/uL — ABNORMAL HIGH (ref 4.0–10.5)
nRBC: 0 % (ref 0.0–0.2)

## 2022-01-28 MED ORDER — CELECOXIB 100 MG PO CAPS: 100.0000 mg | ORAL_CAPSULE | Freq: Two times a day (BID) | ORAL | 0 refills | Status: DC

## 2022-01-28 MED ORDER — ACETAMINOPHEN 500 MG PO TABS: 500.0000 mg | ORAL_TABLET | Freq: Four times a day (QID) | ORAL | 0 refills | Status: AC | PRN

## 2022-01-28 MED ORDER — OXYCODONE HCL 5 MG PO TABS: 5.0000 mg | ORAL_TABLET | ORAL | 0 refills | Status: DC | PRN

## 2022-01-28 MED ORDER — APIXABAN 2.5 MG PO TABS: 2.5000 mg | ORAL_TABLET | Freq: Two times a day (BID) | ORAL | 0 refills | Status: DC

## 2022-01-28 MED ORDER — ONDANSETRON HCL 4 MG PO TABS: 4.0000 mg | ORAL_TABLET | Freq: Four times a day (QID) | ORAL | 0 refills | Status: AC | PRN

## 2022-01-28 NOTE — Discharge Summary (Cosign Needed Addendum)
Physician Discharge Summary  Patient ID: Keyanah Kozicki MRN: [id number] DOB/AGE: [birth year] 76 y.o.  Admit date: 01/26/2022 Discharge date: 01/29/2022  Admission Diagnoses:  Status post total knee replacement using cement, right [Z96.651] Degenerative joint disease of the right knee  Discharge Diagnoses: Patient Active Problem List   Diagnosis Date Noted   Status post total knee replacement using cement, 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   Cellulitis 05/17/2017    Past Medical History:  Diagnosis Date   Cervical cancer ([facility]) 1988   Degenerative disc disease, thoracic    Dry eye syndrome    Family history of adverse reaction to anesthesia    dad-hard to intubate   GERD (gastroesophageal reflux disease)    Glaucoma    Headache    h/o migraines   Hyperlipidemia    Hypertension    PAT (paroxysmal atrial tachycardia) (HCC)    Skin cancer    Neck     Transfusion: None.   Consultants (if any):   Discharged Condition: Improved  Hospital Course: Aryam Zhan is an 76 y.o. female who was admitted 01/26/2022 with a diagnosis of degenerative joint disease of the right knee and went to the operating room on 01/26/2022 and underwent the above named procedures.    Surgeries: Procedure(s): TOTAL KNEE ARTHROPLASTY - RNFA on 01/26/2022 Patient tolerated the surgery well. Taken to PACU where she was stabilized and then transferred to the orthopedic floor.  Started on Eliquis 2.'5mg'$  every 12 hrs. Foot pumps applied bilaterally at 80 mm. Heels elevated on bed with rolled towels. No evidence of DVT. Negative Homan. Physical therapy started on day #1 for gait training and transfer. OT started day #1 for ADL and assisted devices. On postop day 1 patient ambulated 100 feet x 2 but the second stent of ambulation resulted in some dizziness and hypotension.  Overall pain well controlled.  Vital signs are stable.  On postop day 2 patient able to safely participate in PT.  Patient doing well stable and ready for  discharge to home with home health PT  Patient's IV  was removed on POD2.  Implants: Right TKA using all-cemented [employer] Vanguard system with a 65 mm PCR femur, a 71 mm tibial tray with a 10 mm anterior stabilized E-poly insert, and a 34 x 7.8 mm all-poly 3-pegged domed patella.  She was given perioperative antibiotics:  Anti-infectives (From admission, onward)    Start     Dose/Rate Route Frequency Ordered Stop   01/26/22 1400  ceFAZolin (ANCEF) IVPB 2g/100 mL premix        2 g 200 mL/hr over 30 Minutes Intravenous Every 6 hours 01/26/22 1113 01/27/22 0238   01/26/22 0615  ceFAZolin (ANCEF) 2-4 GM/100ML-% IVPB       Note to Pharmacy: Lorenza Cambridge J: cabinet override      01/26/22 0615 01/26/22 2105   01/26/22 0600  ceFAZolin (ANCEF) IVPB 2g/100 mL premix        2 g 200 mL/hr over 30 Minutes Intravenous On call to O.R. 01/26/22 4562 01/26/22 0755     .  She was given sequential compression devices, early ambulation, and Eliquis for DVT prophylaxis.  She benefited maximally from the hospital stay and there were no complications.    Recent vital signs:  Vitals:   01/28/22 1549 01/28/22 2330  BP: (!) 155/59 (!) 161/65  Pulse: 85 75  Resp: 18 16  Temp: 98.3 F (36.8 C) 98.5 F (36.9 C)  SpO2: 96% 92%    Recent laboratory studies:  Lab  Results  Component Value Date   HGB 10.6 (L) 01/28/2022   HGB 10.5 (L) 01/27/2022   HGB 11.7 (L) 01/19/2022   Lab Results  Component Value Date   WBC 11.9 (H) 01/28/2022   PLT 376 01/28/2022   No results found for: "INR" Lab Results  Component Value Date   NA 128 (L) 01/27/2022   K 4.1 01/27/2022   CL 92 (L) 01/27/2022   CO2 27 01/27/2022   BUN 25 (H) 01/27/2022   CREATININE 0.76 01/27/2022   GLUCOSE 140 (H) 01/27/2022    Discharge Medications:   Allergies as of 01/29/2022       Reactions   Bee Venom Swelling   Hydrocodone Other (See Comments)   Pt can only take half of 5 mg hydrocodone and then 2 hours later the other  half   Ivp Dye [iodinated Contrast Media] Rash   Penicillins Rash   Septra [sulfamethoxazole-trimethoprim] Rash        Medication List     STOP taking these medications    meloxicam 7.5 MG tablet Commonly known as: MOBIC       TAKE these medications    acetaminophen 500 MG tablet Commonly known as: TYLENOL Take 1-2 tablets (500-1,000 mg total) by mouth every 6 (six) hours as needed. What changed:  how much to take when to take this reasons to take this   amLODipine 5 MG tablet Commonly known as: NORVASC Take 5 mg by mouth every morning.   apixaban 2.5 MG Tabs tablet Commonly known as: ELIQUIS Take 1 tablet (2.5 mg total) by mouth 2 (two) times daily.   calcium-vitamin D 500-200 MG-UNIT Tabs tablet Commonly known as: OSCAL WITH D Take 2 tablets by mouth daily. gummies   celecoxib 100 MG capsule Commonly known as: CeleBREX Take 1 capsule (100 mg total) by mouth 2 (two) times daily.   Centrum Silver 50+Women Tabs Take 1 tablet by mouth daily.   hydrochlorothiazide 25 MG tablet Commonly known as: HYDRODIURIL Take 25 mg by mouth every morning.   latanoprost 0.005 % ophthalmic solution Commonly known as: XALATAN Place 1 drop into the right eye at bedtime.   lisinopril 20 MG tablet Commonly known as: ZESTRIL Take 20 mg by mouth 2 (two) times daily.   MAGNESIUM CITRATE PO Take 250 mg by mouth daily.   melatonin 5 MG Tabs Take 5 mg by mouth at bedtime.   omeprazole 40 MG capsule Commonly known as: PRILOSEC Take 40 mg by mouth every morning.   ondansetron 4 MG tablet Commonly known as: ZOFRAN Take 1 tablet (4 mg total) by mouth every 6 (six) hours as needed for nausea.   oxyCODONE 5 MG immediate release tablet Commonly known as: Oxy IR/ROXICODONE Take 1-2 tablets (5-10 mg total) by mouth every 4 (four) hours as needed for severe pain.   simvastatin 10 MG tablet Commonly known as: ZOCOR Take 10 mg by mouth at bedtime.   Systane Complete 0.6 %  Soln Generic drug: Propylene Glycol Place 1 drop into both eyes 3 (three) times daily.               Durable Medical Equipment  (From admission, onward)           Start     Ordered   01/26/22 1013  DME Bedside commode  Once       Question:  Patient needs a bedside commode to treat with the following condition  Answer:  Status post total knee replacement using cement,  right   01/26/22 1012   01/26/22 1013  DME 3 n 1  Once        01/26/22 1012   01/26/22 1013  DME Walker rolling  Once       Question Answer Comment  Walker: With 5 Inch Wheels   Patient needs a walker to treat with the following condition Status post total knee replacement using cement, right      01/26/22 1012            Diagnostic Studies: DG Knee Right Port  Result Date: 01/26/2022 CLINICAL DATA:  Status post right knee arthroplasty. EXAM: PORTABLE RIGHT KNEE - 1-2 VIEW COMPARISON:  Right knee intraoperative fluoroscopy 11/25/2019 FINDINGS: Status post right knee arthroplasty. No perihardware lucency is seen to indicate hardware failure or loosening. Expected postoperative changes including mild joint effusion, mild-to-moderate intra-articular air, mild anterior subcutaneous air, and mild soft tissue swelling. Anterior surgical skin staples. No acute fracture or dislocation. IMPRESSION: Status post total right knee arthroplasty without evidence of hardware failure. Electronically Signed   By: Yvonne Kendall M.D.   On: 01/26/2022 10:41   DG BONE DENSITY (DXA)  Result Date: 01/20/2022 EXAM: DUAL X-RAY ABSORPTIOMETRY (DXA) FOR BONE MINERAL DENSITY IMPRESSION: Your patient Magalene Mclear completed a BMD test on 01/20/2022 using the [place] (analysis version: 14.10) manufactured by [employer]. The following summarizes the results of our evaluation. Technologist: LCE PATIENT BIOGRAPHICAL: Name: Jet, Traynham Patient ID: [id number] Birth Date: [birth year] Height: 66.0 in. Gender: Female Exam  Date: 01/20/2022 Weight: 176.8 lbs. Indications: Caucasian, Early Menopause, Hysterectomy, POSTmenopausal Fractures: Treatments: Calcium, Vitamin D ASSESSMENT: The BMD measured at Femur Neck Right is 0.817 g/cm2 with a T-score of -1.6. This patient is considered osteopenic according to [facility]) criteria. Patient is not a candidate for FRAX due to recent use of Boniva. L-3 and L-4 were excluded due to degenerative changes. The scan quality is good. Site Region Measured Measured WHO Young Adult BMD Date       Age      Classification T-score AP Spine L1-L2 1/20/[age] Normal 0.5 1.240 g/cm2 DualFemur Neck Right 01/20/2021 77.1 Osteopenia -1.6 0.817 g/cm2 World Health Organization [facility]) criteria for post-menopausal, Caucasian Women: Normal:       T-score at or above -1 SD Osteopenia:   T-score between -1 and -2.5 SD Osteoporosis: T-score at or below -2.5 SD RECOMMENDATIONS: 1. All patients should optimize calcium and vitamin D intake. 2. Consider FDA-approved medical therapies in postmenopausal women and men aged 40 years and older, based on the following: a. A hip or vertebral (clinical or morphometric) fracture b. T-score < -2.5 at the femoral neck or spine after appropriate evaluation to exclude secondary causes c. Low bone mass (T-score between -1.0 and -2.5 at the femoral neck or spine) and a 10-year probability of a hip fracture > 3% or a 10-year probability of a major osteoporosis-related fracture > 20% based on the [place]-adapted WHO algorithm d. Clinician judgment and/or patient preferences may indicate treatment for people with 10-year fracture probabilities above or below these levels FOLLOW-UP: People with diagnosed cases of osteoporosis or at high risk for fracture should have regular bone mineral density tests. For patients eligible for Medicare, routine testing is allowed once every 2 years. The testing frequency can be increased to one year for patients who have rapidly progressing  disease, those who are receiving or discontinuing medical therapy to restore bone mass, or have additional risk factors. I have  reviewed this report, and agree with the above findings. [facility] Radiology Electronically Signed   By: Ammie Ferrier M.D.   On: 01/20/2022 14:04    Disposition: Plan for discharge home this afternoon pending progress with PT.   Follow-up Information     Lattie Corns, PA-C Follow up in 14 day(s).   Specialty: Physician Assistant Why: Electa Sniff information: [place] [address] [phone]                Signed: Feliberto Gottron PA-C 01/29/2022, 6:38 AM

## 2022-01-28 NOTE — Progress Notes (Signed)
Physical Therapy Treatment Patient Details Name: Megan Montgomery MRN: [id number] DOB: 1945-08-07 Today's Date: 01/28/2022   History of Present Illness Patient is a 76 year old female with degenerative joint disease, right knee. s/p right TKA. 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   PT Comments    The patient reports feeling overall better today, although she did not get much sleep last night. Pain was 4/10 in right knee. Gait training progressed in hallway with occasional cues for improved gait kinematics. The patient ambulated a lap around the nursing station. Improved knee ROM today as well. She is requesting for PT to follow up again in the afternoon for one more session prior to discharge home today.    Recommendations for follow up therapy are one component of a multi-disciplinary discharge planning process, led by the attending physician.  Recommendations may be updated based on patient status, additional functional criteria and insurance authorization.  Follow Up Recommendations  Home health PT     Assistance Recommended at Discharge Set up Supervision/Assistance  Patient can return home with the following Help with stairs or ramp for entrance;Assist for transportation   Equipment Recommendations  Rolling walker (2 wheels)    Recommendations for Other Services       Precautions / Restrictions Precautions Precautions: Fall;Knee Restrictions Weight Bearing Restrictions: Yes RLE Weight Bearing: Weight bearing as tolerated     Mobility  Bed Mobility Overal bed mobility: Needs Assistance Bed Mobility: Supine to Sit, Sit to Supine     Supine to sit: Supervision, HOB elevated     General bed mobility comments: no physical assistance needed. good safety awareness    Transfers Overall transfer level: Needs assistance Equipment used: Rolling walker (2 wheels) Transfers: Sit to/from  Stand Sit to Stand: Supervision           General transfer comment: good safety awareness demonstrated without verbal cues    Ambulation/Gait Ambulation/Gait assistance: Supervision, Min guard Gait Distance (Feet): 175 Feet Assistive device: Rolling walker (2 wheels) Gait Pattern/deviations: Step-through pattern, Decreased stride length Gait velocity: decreased     General Gait Details: cues for improved gait kinematics including heel toe gait patten and having rolling walker closer to base of support. patient ambulated around the nursing station with 4/10 pain reported in R knee.   Stairs             Wheelchair Mobility    Modified Rankin (Stroke Patients Only)       Balance                                            Cognition Arousal/Alertness: Awake/alert Behavior During Therapy: WFL for tasks assessed/performed Overall Cognitive Status: Within Functional Limits for tasks assessed                                 General Comments: patient is able to follow all commands without difficulty        Exercises Total Joint Exercises Hip ABduction/ADduction: AAROM, Strengthening, Right, 10 reps, Seated Goniometric ROM: R knee 10-77 degrees    General Comments        Pertinent Vitals/Pain Pain Assessment Pain Assessment: 0-10 Pain Score: 4  Pain Location: R knee Pain Descriptors / Indicators: Discomfort Pain  Intervention(s): Limited activity within patient's tolerance, Monitored during session (polar care reapplied)    Home Living                          Prior Function            PT Goals (current goals can now be found in the care plan section) Acute Rehab PT Goals Patient Stated Goal: to return home PT Goal Formulation: With patient Time For Goal Achievement: 02/09/22 Potential to Achieve Goals: Good Progress towards PT goals: Progressing toward goals    Frequency    BID      PT Plan  Current plan remains appropriate    Co-evaluation              AM-PAC PT "6 Clicks" Mobility   Outcome Measure  Help needed turning from your back to your side while in a flat bed without using bedrails?: A Little Help needed moving from lying on your back to sitting on the side of a flat bed without using bedrails?: A Little Help needed moving to and from a bed to a chair (including a wheelchair)?: A Little Help needed standing up from a chair using your arms (e.g., wheelchair or bedside chair)?: A Little Help needed to walk in hospital room?: A Little Help needed climbing 3-5 steps with a railing? : A Little 6 Click Score: 18    End of Session Equipment Utilized During Treatment: Gait belt Activity Tolerance: Patient tolerated treatment well Patient left: in chair;with call bell/phone within reach (polar care re-applied) Nurse Communication: Mobility status;Patient requests pain meds (patient requesting oral pain medication at the next available time) PT Visit Diagnosis: Muscle weakness (generalized) (M62.81);Other abnormalities of gait and mobility (R26.89)     Time: 2947-6546 PT Time Calculation (min) (ACUTE ONLY): 25 min  Charges:  $Gait Training: 23-37 mins                     Minna Merritts, PT, MPT    Percell Locus 01/28/2022, 11:09 AM

## 2022-01-28 NOTE — Progress Notes (Signed)
  Subjective: 2 Days Post-Op Procedure(s) (LRB): TOTAL KNEE ARTHROPLASTY - RNFA (Right) Patient reports pain as moderate.   Patient is well, and has had no acute complaints or problems Was able to walk 75 feet yesterday with therapy. Was able to get up on her own during the night and go to the bathroom safely. Plan is to go Home after hospital stay. Negative for chest pain and shortness of breath Fever: no Gastrointestinal:Negative for nausea and vomiting Patient reports that she is passing gas this morning.  Objective: Vital signs in last 24 hours: Temp:  [98.3 F (36.8 C)-98.5 F (36.9 C)] 98.5 F (36.9 C) (09/22 0500) Pulse Rate:  [76-90] 87 (09/22 0500) Resp:  [16-18] 18 (09/22 0500) BP: (140-167)/(66-76) 167/76 (09/22 0500) SpO2:  [95 %-96 %] 95 % (09/22 0500)  Intake/Output from previous day: No intake or output data in the 24 hours ending 01/28/22 0745   Intake/Output this shift: No intake/output data recorded.  Labs: Recent Labs    01/27/22 0419 01/28/22 0620  HGB 10.5* 10.6*   Recent Labs    01/27/22 0419 01/28/22 0620  WBC 14.3* 11.9*  RBC 3.51* 3.50*  HCT 31.0* 30.3*  PLT 348 376   Recent Labs    01/27/22 0419  NA 128*  K 4.1  CL 92*  CO2 27  BUN 25*  CREATININE 0.76  GLUCOSE 140*  CALCIUM 9.0   No results for input(s): "LABPT", "INR" in the last 72 hours.   EXAM General - Patient is Alert, Appropriate, and Oriented Extremity - ABD soft Neurovascular intact Dorsiflexion/Plantar flexion intact Incision: scant drainage No cellulitis present Compartment soft Dressing/Incision - Mild bloody drainage noted to the right knee honeycomb dressing. Motor Function - intact, moving foot and toes well on exam.  ABdomen soft with intact bowel sounds.  Past Medical History:  Diagnosis Date   Cervical cancer ([place]) 1988   Degenerative disc disease, thoracic    Dry eye syndrome    Family history of adverse reaction to anesthesia    dad-hard to  intubate   GERD (gastroesophageal reflux disease)    Glaucoma    Headache    h/o migraines   Hyperlipidemia    Hypertension    PAT (paroxysmal atrial tachycardia) (HCC)    Skin cancer    Neck    Assessment/Plan: 2 Days Post-Op Procedure(s) (LRB): TOTAL KNEE ARTHROPLASTY - RNFA (Right) Principal Problem:   Status post total knee replacement using cement, right  Estimated body mass index is 28.41 kg/m as calculated from the following:   Height as of this encounter: '5\' 6"'$  (1.676 m).   Weight as of this encounter: 79.8 kg. Advance diet Up with therapy D/C IV fluids when tolerating po intake.  Labs and vitals reviewed, Hg 10.6 this morning. Up with therapy today, patient lives at home by herself. Patient is passing gas without pain this morning. Plan for discharge home today pending progress with therapy.  DVT Prophylaxis -  Eliquis and SCDs Weight-Bearing as tolerated to right leg  J. Cameron Proud, PA-C [facility] 01/28/2022, 7:45 AM

## 2022-01-28 NOTE — Plan of Care (Signed)

## 2022-01-28 NOTE — Plan of Care (Signed)
  Problem: Activity: Goal: Risk for activity intolerance will decrease Outcome: Progressing   Problem: Pain Managment: Goal: General experience of comfort will improve Outcome: Progressing   Problem: Safety: Goal: Ability to remain free from injury will improve Outcome: Progressing   

## 2022-01-28 NOTE — Discharge Instructions (Signed)

## 2022-01-28 NOTE — Progress Notes (Signed)
Physical Therapy Treatment Patient Details Name: Megan Montgomery MRN: [id number] DOB: [birth year] Today's Date: 01/28/2022   History of Present Illness Patient is a 76 year old female with degenerative joint disease, right knee. s/p right TKA. 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    PT Comments    The patient requested to ambulate a shorter distance in the hallway and focus on exercises prior to discharge. The patient had improved gait kinematics for a short distance. She then became diaphoretic and lightheaded. Nurse assisted with quickly bringing a chair. Blood pressure 110/47. Patient was assisted back to bed where I reviewed all exercises in the HEP. Discharge has now been cancelled after discussion with ortho PA. PT will follow up again in the morning.     Recommendations for follow up therapy are one component of a multi-disciplinary discharge planning process, led by the attending physician.  Recommendations may be updated based on patient status, additional functional criteria and insurance authorization.  Follow Up Recommendations  Home health PT     Assistance Recommended at Discharge Set up Supervision/Assistance  Patient can return home with the following Help with stairs or ramp for entrance;Assist for transportation   Equipment Recommendations  Rolling walker (2 wheels)    Recommendations for Other Services       Precautions / Restrictions Precautions Precautions: Fall;Knee Restrictions Weight Bearing Restrictions: Yes RLE Weight Bearing: Weight bearing as tolerated     Mobility  Bed Mobility Overal bed mobility: Needs Assistance Bed Mobility: Supine to Sit, Sit to Supine     Supine to sit: Min assist Sit to supine: Supervision   General bed mobility comments: assistance for RLE support to return to bed. cues for technique    Transfers Overall transfer level: Needs  assistance Equipment used: Rolling walker (2 wheels) Transfers: Sit to/from Stand Sit to Stand: Supervision           General transfer comment: multiple standing bouts performed. verbal cues for technique.    Ambulation/Gait Ambulation/Gait assistance: Min guard, Supervision Gait Distance (Feet): 100 Feet Assistive device: Rolling walker (2 wheels) Gait Pattern/deviations: Step-through pattern Gait velocity: decreased     General Gait Details: improved heel toe gait pattern compared to this morning with emphasis on improved gait kinematics. patient became diaphoretic and lightheaded with ambulation with chair required. blood pressure 110/47, Sp02 95%, heart rate 90bpm. alerted PA about events and discharged home was cancelled for today with the goal of now d/c tomorrow.   Stairs         General stair comments: stairs are not required for home management. the patient declined stair training due to fatigue. verbally educated patient on proper sequencing of steps and she verbalized understanding.   Wheelchair Mobility    Modified Rankin (Stroke Patients Only)       Balance                                            Cognition Arousal/Alertness: Awake/alert Behavior During Therapy: WFL for tasks assessed/performed Overall Cognitive Status: Within Functional Limits for tasks assessed                                 General Comments: patient is able to follow all commands without difficulty  Exercises Total Joint Exercises Hip ABduction/ADduction: AAROM, Strengthening, Right, 10 reps, Seated Goniometric ROM: R knee 10-77 degrees Other Exercises Other Exercises: after ambulation, patient fatigued and no longer wanted to participate with exercise. I reviewed all exercises in the HEP with the patient and answered all questions.    General Comments        Pertinent Vitals/Pain Pain Assessment Pain Assessment: 0-10 Pain  Score: 3  Pain Location: R knee Pain Descriptors / Indicators: Discomfort Pain Intervention(s): Limited activity within patient's tolerance, Monitored during session (polar care re-applied)    Home Living                          Prior Function            PT Goals (current goals can now be found in the care plan section) Acute Rehab PT Goals Patient Stated Goal: to return home PT Goal Formulation: With patient Time For Goal Achievement: 02/09/22 Potential to Achieve Goals: Good Progress towards PT goals: Progressing toward goals    Frequency    BID      PT Plan Current plan remains appropriate    Co-evaluation              AM-PAC PT "6 Clicks" Mobility   Outcome Measure  Help needed turning from your back to your side while in a flat bed without using bedrails?: A Little Help needed moving from lying on your back to sitting on the side of a flat bed without using bedrails?: A Little Help needed moving to and from a bed to a chair (including a wheelchair)?: A Little Help needed standing up from a chair using your arms (e.g., wheelchair or bedside chair)?: A Little Help needed to walk in hospital room?: A Little Help needed climbing 3-5 steps with a railing? : A Little 6 Click Score: 18    End of Session Equipment Utilized During Treatment: Gait belt Activity Tolerance: Patient limited by fatigue Patient left: in bed;with call bell/phone within reach;with SCD's reapplied (polar care in place) Nurse Communication: Mobility status;Precautions PT Visit Diagnosis: Muscle weakness (generalized) (M62.81);Other abnormalities of gait and mobility (R26.89)     Time: 9417-4081 PT Time Calculation (min) (ACUTE ONLY): 40 min  Charges:  $Gait Training: 23-37 mins $Therapeutic Activity: 8-22 mins                    Minna Merritts, PT, MPT  Percell Locus 01/28/2022, 1:53 PM

## 2022-01-29 DIAGNOSIS — M1711 Unilateral primary osteoarthritis, right knee: Secondary | ICD-10-CM | POA: Diagnosis not present

## 2022-01-29 NOTE — Progress Notes (Signed)
Physical Therapy Treatment Patient Details Name: Megan Montgomery MRN: [id number] DOB: [birth year] Today's Date: 01/29/2022   History of Present Illness Patient is a 76 year old female with degenerative joint disease, right knee. s/p right TKA. 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    PT Comments    Pt was long sitting in bed upon arriving. Agrees to session and is cooperative throughout. She demonstrated safe ability to exit bed, stand, and ambulate around unit. Encouraged ROM and there ex at DC. Pt states understanding. Pt is cleared form an acute PT standpoint for safe DC home with HHPT to follow.    Recommendations for follow up therapy are one component of a multi-disciplinary discharge planning process, led by the attending physician.  Recommendations may be updated based on patient status, additional functional criteria and insurance authorization.  Follow Up Recommendations  Home health PT     Assistance Recommended at Discharge Set up Supervision/Assistance  Patient can return home with the following Help with stairs or ramp for entrance;Assist for transportation   Equipment Recommendations  None recommended by PT (pt has allequipment needs met)       Precautions / Restrictions Precautions Precautions: Fall;Knee Precaution Booklet Issued: Yes (comment) Restrictions Weight Bearing Restrictions: Yes RLE Weight Bearing: Weight bearing as tolerated     Mobility  Bed Mobility Overal bed mobility: Needs Assistance Bed Mobility: Supine to Sit, Sit to Supine  Supine to sit: Supervision Sit to supine: Min assist  Transfers Overall transfer level: Needs assistance Equipment used: Rolling walker (2 wheels) Transfers: Sit to/from Stand Sit to Stand: Supervision    General transfer comment: no physical assistance to stand or sit from various surface heights     Ambulation/Gait Ambulation/Gait assistance: Supervision Gait Distance (Feet): 200 Feet Assistive device: Rolling walker (2 wheels) Gait Pattern/deviations: Step-through pattern Gait velocity: decreased     General Gait Details: pt ambulated lap around unit without LOB or difficulty   Stairs  General stair comments: pt was re-educated on stair performance. Does not have stairs to enter home. she correctly states proper sequencing    Balance Overall balance assessment: Needs assistance Sitting-balance support: Feet supported Sitting balance-Leahy Scale: Good     Standing balance support: Bilateral upper extremity supported, Reliant on assistive device for balance Standing balance-Leahy Scale: Good       Cognition Arousal/Alertness: Awake/alert Behavior During Therapy: WFL for tasks assessed/performed Overall Cognitive Status: Within Functional Limits for tasks assessed    General Comments: Pt is A and O x 4               Pertinent Vitals/Pain Pain Assessment Pain Assessment: 0-10 Pain Score: 4  Faces Pain Scale: Hurts a little bit Pain Location: R knee Pain Descriptors / Indicators: Discomfort Pain Intervention(s): Limited activity within patient's tolerance, Monitored during session, Premedicated before session, Repositioned, Ice applied     PT Goals (current goals can now be found in the care plan section) Acute Rehab PT Goals Patient Stated Goal: to return home Progress towards PT goals: Progressing toward goals    Frequency    BID      PT Plan Current plan remains appropriate       AM-PAC PT "6 Clicks" Mobility   Outcome Measure  Help needed turning from your back to your side while in a flat bed without using bedrails?: A Little Help needed moving from lying on your back to sitting on  the side of a flat bed without using bedrails?: A Little Help needed moving to and from a bed to a chair (including a wheelchair)?: A Little Help needed  standing up from a chair using your arms (e.g., wheelchair or bedside chair)?: A Little Help needed to walk in hospital room?: A Little Help needed climbing 3-5 steps with a railing? : A Little 6 Click Score: 18    End of Session   Activity Tolerance: Patient tolerated treatment well Patient left: in bed;with call bell/phone within reach;with SCD's reapplied Nurse Communication: Mobility status;Precautions PT Visit Diagnosis: Muscle weakness (generalized) (M62.81);Other abnormalities of gait and mobility (R26.89)     Time: 1010-1034 PT Time Calculation (min) (ACUTE ONLY): 24 min  Charges:  $Gait Training: 8-22 mins $Therapeutic Activity: 8-22 mins                     Julaine Fusi PTA 01/29/22, 10:47 AM

## 2022-01-29 NOTE — Progress Notes (Signed)
  Subjective: 3 Days Post-Op Procedure(s) (LRB): TOTAL KNEE ARTHROPLASTY - RNFA (Right) Patient reports pain as mild.   Patient is well, and has had no acute complaints or problems Patient was able to walk 100 feet yesterday but developed some dizziness/hypotension with her second lap.  No reports of dizziness this morning.  Expecting to discharge home today. Plan is to go Home after hospital stay. Negative for chest pain and shortness of breath Fever: no Gastrointestinal:Negative for nausea and vomiting Patient reports that she is passing gas this morning.  Objective: Vital signs in last 24 hours: Temp:  [98 F (36.7 C)-98.5 F (36.9 C)] 98.5 F (36.9 C) (09/22 2330) Pulse Rate:  [75-92] 75 (09/22 2330) Resp:  [16-18] 16 (09/22 2330) BP: (155-161)/(59-68) 161/65 (09/22 2330) SpO2:  [92 %-98 %] 92 % (09/22 2330)  Intake/Output from previous day: No intake or output data in the 24 hours ending 01/29/22 0633   Intake/Output this shift: No intake/output data recorded.  Labs: Recent Labs    01/27/22 0419 01/28/22 0620  HGB 10.5* 10.6*   Recent Labs    01/27/22 0419 01/28/22 0620  WBC 14.3* 11.9*  RBC 3.51* 3.50*  HCT 31.0* 30.3*  PLT 348 376   Recent Labs    01/27/22 0419  NA 128*  K 4.1  CL 92*  CO2 27  BUN 25*  CREATININE 0.76  GLUCOSE 140*  CALCIUM 9.0   No results for input(s): "LABPT", "INR" in the last 72 hours.   EXAM General - Patient is Alert, Appropriate, and Oriented Abdomen: Soft with intact bowel sounds. Extremity - ABD soft Neurovascular intact Dorsiflexion/Plantar flexion intact Incision: scant drainage No cellulitis present Compartment soft Ankle plantarflexion dorsiflexion is intact. Dressing/Incision -Ace wrap intact along with TED hose and Polar Care unit.  Dressing clean dry and intact Motor Function - intact, moving foot and toes well on exam.    Past Medical History:  Diagnosis Date   Cervical cancer ([place]) 1988    Degenerative disc disease, thoracic    Dry eye syndrome    Family history of adverse reaction to anesthesia    dad-hard to intubate   GERD (gastroesophageal reflux disease)    Glaucoma    Headache    h/o migraines   Hyperlipidemia    Hypertension    PAT (paroxysmal atrial tachycardia) (HCC)    Skin cancer    Neck    Assessment/Plan: 3 Days Post-Op Procedure(s) (LRB): TOTAL KNEE ARTHROPLASTY - RNFA (Right) Principal Problem:   Status post total knee replacement using cement, right  Estimated body mass index is 28.41 kg/m as calculated from the following:   Height as of this encounter: '5\' 6"'$  (1.676 m).   Weight as of this encounter: 79.8 kg. Advance diet Up with therapy Labs and vital signs are stable Pain well controlled Care management to assist with discharge to home with home health PT.  Anticipate discharge to home today pending successful completion of physical therapy without any episodes of dizziness or hypertension.  DVT Prophylaxis -  Eliquis and SCDs Weight-Bearing as tolerated to right leg  T. Rachelle Hora, PA-C [facility] Orthopaedic Surgery 01/29/2022, 6:33 AM

## 2022-01-29 NOTE — Plan of Care (Signed)

## 2022-01-29 NOTE — TOC Transition Note (Signed)
Transition of Care [facility]) - CM/SW Discharge Note   Patient Details  Name: Megan Montgomery MRN: [id number] Date of Birth: [birth year]  Transition of Care [facility]) CM/SW Contact:  Izola Price, RN Phone Number: 01/29/2022, 11:36 AM   Clinical Narrative:  Patient is discharging today. Was set up with [facility] via [place] and DME RW and 3:1 ordered via [employer] on 01/27/22 to be delivered to room. Simmie Davies RN CM      Final next level of care: [place] Barriers to Discharge: Barriers Resolved   Patient Goals and CMS Choice        Discharge Placement                       Discharge Plan and Services   Discharge Planning Services: CM Consult            DME Arranged: Gilford Rile rolling, 3-N-1 DME Agency: [employer] Date DME Agency Contacted: 01/27/22 Time DME Agency Contacted: [id number] Representative spoke with at DME Agency: [place]: PT [place]: [place] Date [place]: 01/27/22 Time [address]: [place] Representative spoke with at [place]: [place]  Social Determinants of Health ([place]) Interventions     Readmission Risk Interventions     No data to display

## 2022-01-29 NOTE — Plan of Care (Signed)
Patient discharged per MD orders at this time.All discharge instructions,education & medications reviewed with the patient.Pt expressed understanding and will comply with dc instructions.follow up appointments was also communicated to the patient.no verbal c/o or any ssx of distress.Pt was discharged home with HH/PT services per order.Pt was transported home by son in a privately owned vehicle.

## 2022-04-04 DIAGNOSIS — D0462 Carcinoma in situ of skin of left upper limb, including shoulder: Secondary | ICD-10-CM | POA: Insufficient documentation

## 2022-08-10 ENCOUNTER — Other Ambulatory Visit: Payer: Self-pay | Admitting: Family Medicine

## 2022-08-10 DIAGNOSIS — Z1231 Encounter for screening mammogram for malignant neoplasm of breast: Secondary | ICD-10-CM

## 2022-09-26 ENCOUNTER — Ambulatory Visit
Admission: RE | Admit: 2022-09-26 | Discharge: 2022-09-26 | Disposition: A | Discharge status: Home / Self Care | Payer: Medicare Other | Source: Ambulatory Visit | Attending: Family Medicine | Admitting: Family Medicine

## 2022-09-26 DIAGNOSIS — Z1231 Encounter for screening mammogram for malignant neoplasm of breast: Secondary | ICD-10-CM | POA: Diagnosis present

## 2022-09-29 ENCOUNTER — Other Ambulatory Visit: Payer: Self-pay | Admitting: Family Medicine

## 2022-09-29 DIAGNOSIS — R928 Other abnormal and inconclusive findings on diagnostic imaging of breast: Secondary | ICD-10-CM

## 2022-09-29 DIAGNOSIS — N63 Unspecified lump in unspecified breast: Secondary | ICD-10-CM

## 2022-09-30 ENCOUNTER — Ambulatory Visit
Admission: RE | Admit: 2022-09-30 | Discharge: 2022-09-30 | Disposition: A | Discharge status: Home / Self Care | Payer: Medicare Other | Source: Ambulatory Visit | Attending: Family Medicine | Admitting: Family Medicine

## 2022-09-30 ENCOUNTER — Encounter: Payer: Self-pay | Admitting: Family Medicine

## 2022-09-30 DIAGNOSIS — R928 Other abnormal and inconclusive findings on diagnostic imaging of breast: Secondary | ICD-10-CM | POA: Insufficient documentation

## 2022-09-30 DIAGNOSIS — N63 Unspecified lump in unspecified breast: Secondary | ICD-10-CM

## 2022-10-06 ENCOUNTER — Other Ambulatory Visit: Payer: Self-pay | Admitting: Family Medicine

## 2022-10-06 DIAGNOSIS — N63 Unspecified lump in unspecified breast: Secondary | ICD-10-CM

## 2022-10-06 DIAGNOSIS — R928 Other abnormal and inconclusive findings on diagnostic imaging of breast: Secondary | ICD-10-CM

## 2022-10-11 ENCOUNTER — Ambulatory Visit
Admission: RE | Admit: 2022-10-11 | Discharge: 2022-10-11 | Disposition: A | Discharge status: Home / Self Care | Payer: Medicare Other | Source: Ambulatory Visit | Attending: Family Medicine | Admitting: Family Medicine

## 2022-10-11 DIAGNOSIS — R928 Other abnormal and inconclusive findings on diagnostic imaging of breast: Secondary | ICD-10-CM | POA: Insufficient documentation

## 2022-10-11 DIAGNOSIS — N63 Unspecified lump in unspecified breast: Secondary | ICD-10-CM | POA: Insufficient documentation

## 2022-10-11 HISTORY — PX: BREAST BIOPSY: SHX20

## 2022-10-11 MED ORDER — LIDOCAINE HCL 1 % IJ SOLN
3.0000 mL | Freq: Once | INTRAMUSCULAR | Status: AC
  Administered 2022-10-11: 3 mL via INTRADERMAL
  Filled 2022-10-11: qty 3

## 2022-10-11 MED ORDER — LIDOCAINE-EPINEPHRINE 1 %-1:100000 IJ SOLN
5.0000 mL | Freq: Once | INTRAMUSCULAR | Status: AC
  Administered 2022-10-11: 5 mL via INTRADERMAL
  Filled 2022-10-11: qty 5

## 2022-10-13 ENCOUNTER — Encounter: Payer: Self-pay | Admitting: *Deleted

## 2022-10-13 DIAGNOSIS — C50919 Malignant neoplasm of unspecified site of unspecified female breast: Secondary | ICD-10-CM

## 2022-10-13 NOTE — Progress Notes (Signed)
Received referral for newly diagnosed breast cancer from [facility].  Navigation initiated.  She will see Dr. Alena Bills and Dr. Maia Plan on Tuesday 6/11.  Appt. Details given to her.

## 2022-10-18 ENCOUNTER — Inpatient Hospital Stay: Payer: Medicare Other | Attending: Internal Medicine | Admitting: Internal Medicine

## 2022-10-18 ENCOUNTER — Inpatient Hospital Stay: Payer: Medicare Other | Admitting: Licensed Clinical Social Worker

## 2022-10-18 ENCOUNTER — Encounter: Payer: Self-pay | Admitting: Internal Medicine

## 2022-10-18 ENCOUNTER — Encounter: Payer: Self-pay | Admitting: Licensed Clinical Social Worker

## 2022-10-18 ENCOUNTER — Encounter: Payer: Self-pay | Admitting: *Deleted

## 2022-10-18 ENCOUNTER — Inpatient Hospital Stay: Payer: Medicare Other

## 2022-10-18 VITALS — BP 136/79 | HR 80 | Temp 98.3°F | Wt 171.8 lb

## 2022-10-18 DIAGNOSIS — Z170 Estrogen receptor positive status [ER+]: Secondary | ICD-10-CM | POA: Diagnosis not present

## 2022-10-18 DIAGNOSIS — Z8051 Family history of malignant neoplasm of kidney: Secondary | ICD-10-CM | POA: Diagnosis not present

## 2022-10-18 DIAGNOSIS — Z803 Family history of malignant neoplasm of breast: Secondary | ICD-10-CM | POA: Diagnosis not present

## 2022-10-18 DIAGNOSIS — Z801 Family history of malignant neoplasm of trachea, bronchus and lung: Secondary | ICD-10-CM | POA: Insufficient documentation

## 2022-10-18 DIAGNOSIS — C50919 Malignant neoplasm of unspecified site of unspecified female breast: Secondary | ICD-10-CM

## 2022-10-18 DIAGNOSIS — Z87891 Personal history of nicotine dependence: Secondary | ICD-10-CM | POA: Diagnosis not present

## 2022-10-18 DIAGNOSIS — Z8541 Personal history of malignant neoplasm of cervix uteri: Secondary | ICD-10-CM | POA: Diagnosis not present

## 2022-10-18 DIAGNOSIS — Z8042 Family history of malignant neoplasm of prostate: Secondary | ICD-10-CM

## 2022-10-18 DIAGNOSIS — D249 Benign neoplasm of unspecified breast: Secondary | ICD-10-CM | POA: Insufficient documentation

## 2022-10-18 DIAGNOSIS — C50411 Malignant neoplasm of upper-outer quadrant of right female breast: Secondary | ICD-10-CM | POA: Insufficient documentation

## 2022-10-18 DIAGNOSIS — I10 Essential (primary) hypertension: Secondary | ICD-10-CM | POA: Diagnosis not present

## 2022-10-18 HISTORY — DX: Malignant neoplasm of unspecified site of unspecified female breast: C50.919

## 2022-10-18 LAB — SURGICAL PATHOLOGY

## 2022-10-18 NOTE — Progress Notes (Signed)
Accompanied patient and her friend to initial medical oncology appointment.   Reviewed Breast Cancer treatment handbook.   Care plan summary given to patient.   Reviewed outreach programs and cancer center services.

## 2022-10-18 NOTE — Progress Notes (Signed)
REFERRING PROVIDER: Michaelyn Barter, MD [address] [place],  [place] [address]  PRIMARY PROVIDER:  Dione Housekeeper, MD  PRIMARY REASON FOR VISIT:  1. Malignant neoplasm of upper-outer quadrant of right breast in female, estrogen receptor positive (HCC)   2. Family history of breast cancer   3. Family history of prostate cancer   4. Family history of kidney cancer      HISTORY OF PRESENT ILLNESS:   Megan Montgomery, a 77 y.o. female, was seen for a [facility] cancer genetics consultation at the request of Dr. Alena Bills due to a personal and family history of cancer.  Megan Montgomery presents to clinic today to discuss the possibility of a hereditary predisposition to cancer, genetic testing, and to further clarify her future cancer risks, as well as potential cancer risks for family members.   In 2024, at the age of 52, Megan Montgomery was diagnosed with invasive mammary carcinoma of the right breast, ER/PR+ HER2-. The treatment plan includes upfront surgery, adjuvant radiation, Oncotype DX, and adjuvant endocrine therapy. Megan Montgomery also has history of cervical cancer at 89 and skin cancer (squamous cell carcinoma).   CANCER HISTORY:  Oncology History  Breast cancer (HCC)  10/18/2022 Initial Diagnosis   Breast cancer (HCC)   10/18/2022 Cancer Staging   Staging form: Breast, AJCC 8th Edition - Clinical: Stage IA (cT1b, cN0, cM0, G2, ER+, PR+, HER2-) - Signed by Megan Barter, MD on 10/18/2022 Stage prefix: Initial diagnosis Histologic grading system: 3 grade system     RISK FACTORS:  Menarche was at age 15-13.  First live birth at age 64.  Ovaries intact: 1 ovary.  Hysterectomy: yes.  Menopausal status: postmenopausal.  Colonoscopy: yes;  reports few polyps . Mammogram within the last year: yes.  Past Medical History:  Diagnosis Date   Breast cancer (HCC) 10/18/2022   Cervical cancer (HCC) 1988   Degenerative disc disease, thoracic    Dry eye syndrome    Family history of adverse  reaction to anesthesia    dad-hard to intubate   GERD (gastroesophageal reflux disease)    Glaucoma    Headache    h/o migraines   Hyperlipidemia    Hypertension    PAT (paroxysmal atrial tachycardia)    Skin cancer    Neck    Past Surgical History:  Procedure Laterality Date   ABDOMINAL HYSTERECTOMY     BREAST BIOPSY Right    neg bx/clip   BREAST BIOPSY Right 10/11/2022   u/s bx, 3:30 RIBBON clip-path pendinf   BREAST BIOPSY Right 10/11/2022   [place] RT BREAST BX W LOC DEV 1ST LESION IMG BX SPEC US GUIDE 10/11/2022 [facility]MOGRAP[facility]   CATARACT EXTRACTION W/ INTRAOCULAR LENS  IMPLANT, BILATERAL Bilateral    COLONOSCOPY  2018   ESOPHAGOGASTRODUODENOSCOPY     KNEE ARTHROSCOPY WITH SUBCHONDROPLASTY Right 11/25/2019   Procedure: Right knee arthroscopic partial medial meniscectomy with subchondroplasty of the medial tibial plateau.;  Surgeon: Signa Kell, MD;  Location: [facility] ORS;  Service: Orthopedics;  Laterality: Right;   OOPHORECTOMY Right    SHOULDER SURGERY Left 2014   partial removal of AC joint d/t osteoarthritis   TONSILLECTOMY     TOTAL KNEE ARTHROPLASTY Right 01/26/2022   Procedure: TOTAL KNEE ARTHROPLASTY - RNFA;  Surgeon: Christena Flake, MD;  Location: [facility] ORS;  Service: Orthopedics;  Laterality: Right;    FAMILY HISTORY:  We obtained a detailed, 4-generation family history.  Significant diagnoses are listed below: Family History  Problem Relation Age of  Onset   Breast cancer Mother 22   Kidney cancer Mother 32   Lung cancer Father 64   Prostate cancer Father 42   Pancreatic cancer Cousin        dx 38s   Lymphoma Cousin     Megan Montgomery has 1 daughter, 49, 1 son, 32.   Megan Montgomery mother had breast cancer at 45 and kidney cancer at 53. Maternal cousin had lymphoma.   Megan Montgomery father had lung cancer in his 40s-60s, prostate cancer in his 11s. A paternal cousin died of pancreatic cancer in his 51s. Paternal grandfather possibly had lung cancer.  Megan Montgomery is unaware of  previous family history of genetic testing for hereditary cancer risks. There is no reported Ashkenazi Jewish ancestry. There is no known consanguinity.    GENETIC COUNSELING ASSESSMENT: Megan Montgomery is a 77 y.o. female with a personal and family history of breast cancer which is somewhat suggestive of a hereditary cancer syndrome and predisposition to cancer. We, therefore, discussed and recommended the following at today's visit.   DISCUSSION: We discussed that approximately 10% of breast cancer is hereditary. Most cases of hereditary breast cancer are associated with BRCA1/BRCA2 genes, which can also increase risk for prostate cancer, although there are other genes associated with hereditary cancer as well. Cancers and risks are gene specific. We discussed that testing is beneficial for several reasons including knowing about cancer risks, identifying potential screening and risk-reduction options that may be appropriate, and to understand if other family members could be at risk for cancer and allow them to undergo genetic testing.   We reviewed the characteristics, features and inheritance patterns of hereditary cancer syndromes. We also discussed genetic testing, including the appropriate family members to test, the process of testing, insurance coverage and turn-around-time for results. We discussed the implications of a negative, positive and/or variant of uncertain significant result. We recommended Megan Montgomery pursue genetic testing for the [facility] Multi-Cancer+RNA gene panel.   Based on Megan Montgomery's personal and family history of cancer, she meets medical criteria for genetic testing. Despite that she meets criteria, she may still have an out of pocket cost.   PLAN: After considering the risks, benefits, and limitations, Megan Montgomery provided informed consent to pursue genetic testing and the blood sample was sent to [facility] for analysis of the Multi-Cancer+RNA panel. Results should be  available within approximately 2-3 weeks' time, at which point they will be disclosed by telephone to Ms. Schacher, as will any additional recommendations warranted by these results. Megan Montgomery will receive a summary of her genetic counseling visit and a copy of her results once available. This information will also be available in Epic.   Ms. Dionne questions were answered to her satisfaction today. Our contact information was provided should additional questions or concerns arise. Thank you for the referral and allowing [place] to share in the care of your patient.   Lacy Duverney, MS, [facility] Genetic Counselor [email or web address] Phone: [phone]  The patient was seen for a total of 18 minutes in face-to-face genetic counseling.  Dr. Orlie Dakin was available for discussion regarding this case.   _______________________________________________________________________ For Office Staff:  Number of people involved in session: 2 Was an Intern/ student involved with case: no

## 2022-10-18 NOTE — Progress Notes (Signed)
[facility] CONSULT NOTE  Patient Care Team: Zada Finders Joycie Peek, MD as PCP - General (Family Medicine) Hulen Luster, RN as Oncology Nurse Navigator  REFERRING PROVIDER: Dr. Zada Finders  REASON FOR REFFERAL: Right breast cancer  CANCER STAGING   Cancer Staging  Breast cancer [facility]) Staging form: Breast, AJCC 8th Edition - Clinical stage from 10/11/2022: Stage IA (cT1b, cN0, cM0, G1, ER+, PR+, HER2-) - Signed by Michaelyn Barter, MD on 10/18/2022 Stage prefix: Initial diagnosis Histologic grading system: 3 grade system   ASSESSMENT & PLAN:  Megan Montgomery 77 y.o. 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  # Right breast IDC, Stage IA ER/PR+, HER2- -detected on a screening mammogram. Diagnostic mammogram and ultrasound from 09/30/2022 showed 9 x 6 x 9 mm spiculated hypoechoic mass at 330 o'clock position 5 cm from the nipple.  No suspicious lymph nodes.  Status post biopsy showed IDC, 7 mm in size, overall grade 1, LVI negative, ER more than 90%, PR 10 to 20%, HER2 equivocal by IHC and negative by FISH.  I discussed with the patient and his friend Harriett Sine in detail about the pathology report, imaging and the treatment management.  She has stage Ia breast cancer.  Goal of treatment is curative.  She is planned to see Dr. Maia Plan today for upfront surgery.  She is interested in lumpectomy.  After surgery, will make referral to radiation oncology.  Will also send the tumor for Oncotype DX testing considering it is more than 5 mm in size.  I also discussed the role of endocrine therapy with letrozole.  Side effects such as myalgia, arthralgia, hot flashes, osteoporosis, mood changes, elevated cholesterol level was discussed.  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  Has  previously taken Boniva.  She is taking calcium vitamin D Gummies.  I will follow-up with her 2 to 3 weeks after surgery to discuss path report.  # Family history of breast cancer -She has family history of breast cancer and kidney cancer in mother.  Father lung and prostate cancer. -Scheduled to see genetics today.  # History of cervical cancer -Status post surgery in 1988  # Hyperlipidemia -Simvastatin     No orders of the defined types were placed in this encounter.   The total time spent in the appointment was 55 minutes encounter with patients including review of chart and various tests results, discussions about plan of care and coordination of care plan   All questions were answered. The patient knows to call the clinic with any problems, questions or concerns. No barriers to learning was detected.  Michaelyn Barter, MD 6/11/202411:50 AM   HISTORY OF PRESENTING ILLNESS:  Megan Montgomery 77 y.o. 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   I have reviewed her chart and materials related to her cancer extensively and collaborated history with the patient. Summary of oncologic history is as follows: Oncology History  Breast cancer (HCC)  09/26/2022 Mammogram   Screening mammogram FINDINGS: In the right breast, a possible mass warrants further evaluation. In the left breast, no findings suspicious for malignancy.   IMPRESSION: Further evaluation is suggested for a possible mass  in the right breast.  Diagnostic mammogram and ultrasound FINDINGS: Spot tomosynthesis views of the right breast demonstrate a 10 mm spiculated mass in the inner central right breast middle depth (spot CC image 29/43, spot MLO image 25/48). This corresponds with the mass seen on screening mammogram.   Targeted right breast  ultrasound was performed. At 3:30 o'clock 5 cm from the nipple, there is a spiculated hypoechoic mass that measures 9 x 6 x 9 mm. There is no definite internal vascularity. This corresponds with the spiculated mass seen on mammogram.   Targeted right axillary ultrasound demonstrates multiple morphologically benign lymph nodes. No lymphadenopathy.   IMPRESSION: Right breast 9 mm spiculated mass in the 3:30 o'clock position is concerning for malignancy. Recommend further assessment with ultrasound-guided biopsy   10/11/2022 Pathology Results   DIAGNOSIS:  A. BREAST, RIGHT 3:30 5 CMFN; ULTRASOUND-GUIDED BIOPSY:  - INVASIVE MAMMARY CARCINOMA, NO SPECIAL TYPE.  Size of invasive carcinoma: 7 mm in this sample  Histologic grade of invasive carcinoma: Grade 1                       Glandular/tubular differentiation score: 2                       Nuclear pleomorphism score: 2                       Mitotic rate score: 1                       Total score: 5  Ductal carcinoma in situ: Present, intermediate grade  Lymphovascular invasion: Not identified   ADDENDUM:  CASE SUMMARY: BREAST BIOMARKER TESTS  Estrogen Receptor (ER) Status: POSITIVE          Percentage of cells with nuclear positivity: Greater than 90%          Average intensity of staining: Strong   Progesterone Receptor (PgR) Status: POSITIVE          Percentage of cells with nuclear positivity: 10-20%          Average intensity of staining: Moderate   HER2 (by immunohistochemistry): EQUIVOCAL (Score 2+)   CASE SUMMARY: BREAST BIOMARKER TESTS TEST(S) PERFORMED: HER2 (by in situ hybridization): NEGATIVE, Group 5 Number of observers: 2 Number of invasive tumor cells counted: 40 Dual probe assay    Average number of HER2 signals per cell: 2.0    Average number of CEP17 signals per cell: 1.7    HER2/CEP17 ratio: 1.18     10/11/2022 Cancer Staging   Staging form: Breast, AJCC 8th Edition - Clinical stage from 10/11/2022: Stage  IA (cT1b, cN0, cM0, G1, ER+, PR+, HER2-) - Signed by Michaelyn Barter, MD on 10/18/2022 Stage prefix: Initial diagnosis Histologic grading system: 3 grade system    Menarche age 51 Children 2 Age at first birth 40 Birth control yes OCP more than 5 years Hysterectomy 1988 took 1 ovary out for cervical cancer HRT yes for about a year History of breast biopsies yes 25 years ago a cyst which was aspirated  Family history- Mother-breast cancer and kidney cancer Father-lung and prostate cancer Maternal cousin-lymphoma  MEDICAL HISTORY:  Past Medical History:  Diagnosis Date   Breast cancer (HCC) 10/18/2022   Cervical cancer (HCC) 1988   Degenerative disc disease, thoracic    Dry eye syndrome    Family history of adverse reaction to anesthesia  dad-hard to intubate   GERD (gastroesophageal reflux disease)    Glaucoma    Headache    h/o migraines   Hyperlipidemia    Hypertension    PAT (paroxysmal atrial tachycardia)    Skin cancer    Neck    SURGICAL HISTORY: Past Surgical History:  Procedure Laterality Date   ABDOMINAL HYSTERECTOMY     BREAST BIOPSY Right    neg bx/clip   BREAST BIOPSY Right 10/11/2022   u/s bx, 3:30 RIBBON clip-path pendinf   BREAST BIOPSY Right 10/11/2022   [place] RT BREAST BX W LOC DEV 1ST LESION IMG BX SPEC US GUIDE 10/11/2022 [facility]   CATARACT EXTRACTION W/ INTRAOCULAR LENS  IMPLANT, BILATERAL Bilateral    COLONOSCOPY  2018   ESOPHAGOGASTRODUODENOSCOPY     KNEE ARTHROSCOPY WITH SUBCHONDROPLASTY Right 11/25/2019   Procedure: Right knee arthroscopic partial medial meniscectomy with subchondroplasty of the medial tibial plateau.;  Surgeon: Signa Kell, MD;  Location: [facility] ORS;  Service: Orthopedics;  Laterality: Right;   OOPHORECTOMY Right    SHOULDER SURGERY Left 2014   partial removal of AC joint d/t osteoarthritis   TONSILLECTOMY     TOTAL KNEE ARTHROPLASTY Right 01/26/2022   Procedure: TOTAL KNEE ARTHROPLASTY - RNFA;  Surgeon: Christena Flake, MD;  Location: [facility] ORS;  Service: Orthopedics;  Laterality: Right;    SOCIAL HISTORY: Social History   Socioeconomic History   Marital status: Divorced    Spouse name: Not on file   Number of children: Not on file   Years of education: Not on file   Highest education level: Not on file  Occupational History   Not on file  Tobacco Use   Smoking status: Former    Packs/day: 1.50    Years: 15.00    Additional pack years: 0.00    Total pack years: 22.50    Types: Cigarettes    Quit date: 37    Years since quitting: 32.4   Smokeless tobacco: Never  Vaping Use   Vaping Use: Never used  Substance and Sexual Activity   Alcohol use: Yes    Alcohol/week: 7.0 standard drinks of alcohol    Types: 7 Shots of liquor per week    Comment: nightly   Drug use: No   Sexual activity: Not on file  Other Topics Concern   Not on file  Social History Narrative   Not on file   Social Determinants of Health   Financial Resource Strain: Not on file  Food Insecurity: Patient Declined (01/26/2022)   Hunger Vital Sign    Worried About Running Out of Food in the Last Year: Patient declined    Ran Out of Food in the Last Year: Patient declined  Transportation Needs: No Transportation Needs (01/26/2022)   PRAPARE - Administrator, Civil Service (Medical): No    Lack of Transportation (Non-Medical): No  Physical Activity: Not on file  Stress: Not on file  Social Connections: Not on file  Intimate Partner Violence: Not At Risk (01/26/2022)   Humiliation, Afraid, Rape, and Kick questionnaire    Fear of Current or Ex-Partner: No    Emotionally Abused: No    Physically Abused: No    Sexually Abused: No    FAMILY HISTORY: Family History  Problem Relation Age of Onset   Breast cancer Mother 35   Kidney cancer Mother    Lung cancer Father    Prostate cancer Father    Prostate cancer Maternal Grandmother  ALLERGIES:  is allergic to bee venom, hydrocodone, ivp dye  [iodinated contrast media], penicillins, and septra [sulfamethoxazole-trimethoprim].  MEDICATIONS:  Current Outpatient Medications  Medication Sig Dispense Refill   acetaminophen (TYLENOL) 500 MG tablet Take 1-2 tablets (500-1,000 mg total) by mouth every 6 (six) hours as needed. 30 tablet 0   amLODipine (NORVASC) 5 MG tablet Take 5 mg by mouth every morning.     calcium-vitamin D (OSCAL WITH D) 500-200 MG-UNIT TABS tablet Take 2 tablets by mouth daily. gummies     hydrochlorothiazide (HYDRODIURIL) 25 MG tablet Take 25 mg by mouth every morning.     latanoprost (XALATAN) 0.005 % ophthalmic solution Place 1 drop into the right eye at bedtime.     lisinopril (PRINIVIL,ZESTRIL) 20 MG tablet Take 20 mg by mouth 2 (two) times daily.     Melatonin 5 MG TABS Take 5 mg by mouth at bedtime.      Multiple Vitamins-Minerals (CENTRUM SILVER 50+WOMEN) TABS Take 1 tablet by mouth daily.     omeprazole (PRILOSEC) 40 MG capsule Take 40 mg by mouth every morning.     ondansetron (ZOFRAN) 4 MG tablet Take 1 tablet (4 mg total) by mouth every 6 (six) hours as needed for nausea. 30 tablet 0   oxyCODONE (OXY IR/ROXICODONE) 5 MG immediate release tablet Take 1-2 tablets (5-10 mg total) by mouth every 4 (four) hours as needed for severe pain. 40 tablet 0   Propylene Glycol (SYSTANE COMPLETE) 0.6 % SOLN Place 1 drop into both eyes 3 (three) times daily.     simvastatin (ZOCOR) 10 MG tablet Take 10 mg by mouth at bedtime.      apixaban (ELIQUIS) 2.5 MG TABS tablet Take 1 tablet (2.5 mg total) by mouth 2 (two) times daily. (Patient not taking: Reported on 10/18/2022) 30 tablet 0   celecoxib (CELEBREX) 100 MG capsule Take 1 capsule (100 mg total) by mouth 2 (two) times daily. (Patient not taking: Reported on 10/18/2022) 30 capsule 0   MAGNESIUM CITRATE PO Take 250 mg by mouth daily. (Patient not taking: Reported on 10/18/2022)     No current facility-administered medications for this visit.    REVIEW OF SYSTEMS:    Pertinent information mentioned in HPI All other systems were reviewed with the patient and are negative.  PHYSICAL EXAMINATION: ECOG PERFORMANCE STATUS: 0 - Asymptomatic  Vitals:   10/18/22 1117  BP: 136/79  Pulse: 80  Temp: 98.3 F (36.8 C)  SpO2: 99%   Filed Weights   10/18/22 1117  Weight: 171 lb 12.8 oz (77.9 kg)    GENERAL:alert, no distress and comfortable SKIN: skin color, texture, turgor are normal, no rashes or significant lesions EYES: normal, conjunctiva are pink and non-injected, sclera clear OROPHARYNX:no exudate, no erythema and lips, buccal mucosa, and tongue normal  NECK: supple, thyroid normal size, non-tender, without nodularity LYMPH:  no palpable lymphadenopathy in the cervical, axillary or inguinal LUNGS: clear to auscultation and percussion with normal breathing effort HEART: regular rate & rhythm and no murmurs and no lower extremity edema ABDOMEN:abdomen soft, non-tender and normal bowel sounds Musculoskeletal:no cyanosis of digits and no clubbing  PSYCH: alert & oriented x 3 with fluent speech NEURO: no focal motor/sensory deficits  LABORATORY DATA:  I have reviewed the data as listed Lab Results  Component Value Date   WBC 11.9 (H) 01/28/2022   HGB 10.6 (L) 01/28/2022   HCT 30.3 (L) 01/28/2022   MCV 86.6 01/28/2022   PLT 376 01/28/2022   Recent  Labs    01/19/22 1103 01/27/22 0419  NA 131* 128*  K 3.4* 4.1  CL 94* 92*  CO2 28 27  GLUCOSE 101* 140*  BUN 18 25*  CREATININE 0.72 0.76  CALCIUM 9.2 9.0  GFRNONAA >60 >60  PROT 8.0  --   ALBUMIN 4.3  --   AST 22  --   ALT 16  --   ALKPHOS 73  --   BILITOT 0.8  --     RADIOGRAPHIC STUDIES: I have personally reviewed the radiological images as listed and agreed with the findings in the report. [place] RT BREAST BX W LOC DEV 1ST LESION IMG BX SPEC US GUIDE  Addendum Date: 10/14/2022   ADDENDUM REPORT: 10/14/2022 12:35 ADDENDUM: Pathology revealed GRADE I INVASIVE MAMMARY CARCINOMA, NO  SPECIAL TYPE, Ductal carcinoma in situ: Present, intermediate grade of the RIGHT breast, 3:30 5 cmfn, (ribbon clip). This was found to be concordant by Dr. Elberta Fortis. Pathology results were discussed with the patient by telephone by Randa Lynn, RN Nurse Navigator. The patient reported doing well after the biopsy with tenderness at the site. Post biopsy instructions and care were reviewed and questions were answered. The patient was encouraged to call [facility] at [facility] for any additional concerns. Surgical and medical oncology referrals were arranged by Irving Shows, RN, Nurse Navigator at [facility] with Dr. Maia Plan and Dr. Alena Bills on October 18, 2022. Pathology results reported by Rene Kocher, RN on 10/14/2022. Electronically Signed   By: Elberta Fortis M.D.   On: 10/14/2022 12:35   Result Date: 10/14/2022 CLINICAL DATA:  Patient presents for ultrasound-guided core needle biopsy of a 9 mm suspicious mass over the 3:30 position of the right breast 5 cm from the nipple. EXAM: ULTRASOUND GUIDED RIGHT BREAST CORE NEEDLE BIOPSY COMPARISON:  Previous exam(s). PROCEDURE: I met with the patient and we discussed the procedure of ultrasound-guided biopsy, including benefits and alternatives. We discussed the high likelihood of a successful procedure. We discussed the risks of the procedure, including infection, bleeding, tissue injury, clip migration, and inadequate sampling. Informed written consent was given. The usual time-out protocol was performed immediately prior to the procedure. Lesion quadrant: Right inner lower quadrant. Using sterile technique and 1% Lidocaine as local anesthetic, under direct ultrasound visualization, a 14 gauge spring-loaded device was used to perform biopsy of the targeted 9 mm mass over the 3:30 position of the right breast 5 cm from the nipple using a inferior to superior approach. At the conclusion of the procedure a ribbon shaped tissue marker clip was  deployed into the biopsy cavity. Follow up 2 view mammogram was performed and dictated separately. IMPRESSION: Ultrasound guided biopsy of a suspicious 9 mm right breast mass. No apparent complications. Electronically Signed: By: Elberta Fortis M.D. On: 10/11/2022 10:19   MM CLIP PLACEMENT RIGHT  Result Date: 10/11/2022 CLINICAL DATA:  Patient is post ultrasound-guided core needle biopsy of a suspicious 9 mm mass over the 3:30 position of the right breast. EXAM: 3D DIAGNOSTIC RIGHT MAMMOGRAM POST ULTRASOUND BIOPSY COMPARISON:  Previous exam(s). FINDINGS: 3D Mammographic images were obtained following ultrasound guided biopsy of the targeted 9 mm mass over the 3:30 position of the right breast. The biopsy marking clip is over the anteromedial edge of the mass on the cc image and located 8-9 mm superior to the mass on the lateral image. IMPRESSION: Positioning of the ribbon shaped biopsy marking clip as described over the inner lower right breast. Note that  the clip is 8-9 mm superior to the mass on the ML image. Final Assessment: Post Procedure Mammograms for Marker Placement Electronically Signed   By: Elberta Fortis M.D.   On: 10/11/2022 10:32  MM 3D DIAGNOSTIC MAMMOGRAM UNILATERAL RIGHT BREAST  Result Date: 09/30/2022 CLINICAL DATA:  Screening recall for possible right breast mass EXAM: DIGITAL DIAGNOSTIC UNILATERAL RIGHT MAMMOGRAM WITH TOMOSYNTHESIS; ULTRASOUND RIGHT BREAST LIMITED TECHNIQUE: Right digital diagnostic mammography and breast tomosynthesis was performed.; Targeted ultrasound examination of the right breast was performed COMPARISON:  Previous exam(s). ACR Breast Density Category b: There are scattered areas of fibroglandular density. FINDINGS: Spot tomosynthesis views of the right breast demonstrate a 10 mm spiculated mass in the inner central right breast middle depth (spot CC image 29/43, spot MLO image 25/48). This corresponds with the mass seen on screening mammogram. Targeted right breast  ultrasound was performed. At 3:30 o'clock 5 cm from the nipple, there is a spiculated hypoechoic mass that measures 9 x 6 x 9 mm. There is no definite internal vascularity. This corresponds with the spiculated mass seen on mammogram. Targeted right axillary ultrasound demonstrates multiple morphologically benign lymph nodes. No lymphadenopathy. IMPRESSION: Right breast 9 mm spiculated mass in the 3:30 o'clock position is concerning for malignancy. Recommend further assessment with ultrasound-guided biopsy. RECOMMENDATION: Right breast ultrasound-guided biopsy (1 site). I have discussed the findings and recommendations with the patient. The biopsy procedure was discussed with the patient and questions were answered. Patient expressed their understanding of the biopsy recommendation. Patient will be scheduled for biopsy at her earliest convenience by the schedulers. Ordering provider will be notified. If applicable, a reminder letter will be sent to the patient regarding the next appointment. BI-RADS CATEGORY  4: Suspicious. Electronically Signed   By: Jacob Moores M.D.   On: 09/30/2022 09:02  [place] LIMITED ULTRASOUND INCLUDING AXILLA RIGHT BREAST  Result Date: 09/30/2022 CLINICAL DATA:  Screening recall for possible right breast mass EXAM: DIGITAL DIAGNOSTIC UNILATERAL RIGHT MAMMOGRAM WITH TOMOSYNTHESIS; ULTRASOUND RIGHT BREAST LIMITED TECHNIQUE: Right digital diagnostic mammography and breast tomosynthesis was performed.; Targeted ultrasound examination of the right breast was performed COMPARISON:  Previous exam(s). ACR Breast Density Category b: There are scattered areas of fibroglandular density. FINDINGS: Spot tomosynthesis views of the right breast demonstrate a 10 mm spiculated mass in the inner central right breast middle depth (spot CC image 29/43, spot MLO image 25/48). This corresponds with the mass seen on screening mammogram. Targeted right breast ultrasound was performed. At 3:30 o'clock 5 cm from  the nipple, there is a spiculated hypoechoic mass that measures 9 x 6 x 9 mm. There is no definite internal vascularity. This corresponds with the spiculated mass seen on mammogram. Targeted right axillary ultrasound demonstrates multiple morphologically benign lymph nodes. No lymphadenopathy. IMPRESSION: Right breast 9 mm spiculated mass in the 3:30 o'clock position is concerning for malignancy. Recommend further assessment with ultrasound-guided biopsy. RECOMMENDATION: Right breast ultrasound-guided biopsy (1 site). I have discussed the findings and recommendations with the patient. The biopsy procedure was discussed with the patient and questions were answered. Patient expressed their understanding of the biopsy recommendation. Patient will be scheduled for biopsy at her earliest convenience by the schedulers. Ordering provider will be notified. If applicable, a reminder letter will be sent to the patient regarding the next appointment. BI-RADS CATEGORY  4: Suspicious. Electronically Signed   By: Jacob Moores M.D.   On: 09/30/2022 09:02  MM 3D SCREENING MAMMOGRAM BILATERAL BREAST  Result Date: 09/27/2022 CLINICAL DATA:  Screening.  EXAM: DIGITAL SCREENING BILATERAL MAMMOGRAM WITH TOMOSYNTHESIS AND CAD TECHNIQUE: Bilateral screening digital craniocaudal and mediolateral oblique mammograms were obtained. Bilateral screening digital breast tomosynthesis was performed. The images were evaluated with computer-aided detection. COMPARISON:  Previous exam(s). ACR Breast Density Category c: The breasts are heterogeneously dense, which may obscure small masses. FINDINGS: In the right breast, a possible mass warrants further evaluation. In the left breast, no findings suspicious for malignancy. IMPRESSION: Further evaluation is suggested for a possible mass in the right breast. RECOMMENDATION: Diagnostic mammogram and possibly ultrasound of the right breast. (Code:[id number]) The patient will be contacted regarding the  findings, and additional imaging will be scheduled. BI-RADS CATEGORY  0: Incomplete: Need additional imaging evaluation. Electronically Signed   By: Edwin Cap M.D.   On: 09/27/2022 12:28

## 2022-10-19 ENCOUNTER — Encounter: Payer: Self-pay | Admitting: *Deleted

## 2022-10-19 ENCOUNTER — Other Ambulatory Visit: Payer: Self-pay | Admitting: General Surgery

## 2022-10-19 ENCOUNTER — Ambulatory Visit: Payer: Self-pay | Admitting: General Surgery

## 2022-10-19 DIAGNOSIS — Z170 Estrogen receptor positive status [ER+]: Secondary | ICD-10-CM

## 2022-10-19 NOTE — H&P (View-Only) (Signed)
PATIENT PROFILE: Megan Montgomery is a 77 y.o. female who presents to the Clinic for consultation at the request of Dr. Zada Montgomery for evaluation of right breast cancer.  PCP:  Megan Housekeeper, MD  HISTORY OF PRESENT ILLNESS: Ms. Megan Montgomery reports had her usual screening mammogram on 09/26/2022.  She was found with a 9 mm spiculated mass of the right breast at the 330 o'clock position.  She had a core biopsy that showed invasive mammary carcinoma, no special type, ER positive PR positive, HER2 acute recall but negative by FISH.  Patient denies any previous breast pain, palpable masses, nipple discharge or skin changes.  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   Status post total knee replacement using cement, right 01/27/2022   Primary osteoarthritis of right knee 12/29/2021   Cervical spondylosis 09/23/2021   Partial thickness rotator cuff tear 09/23/2021   Abnormal ECG 10/09/2020   SOBOE (shortness of breath on exertion) 10/09/2020   Cellulitis 05/17/2017   Screening for osteoporosis 10/12/2016   Chronic midline low back pain without sciatica 10/12/2016   SI (sacroiliac) pain 10/12/2016   Osteoarthritis (arthritis due to wear and tear of joints) Unknown   Overview    Neck, lower back        Essential hypertension Unknown   Hx of cervical cancer Unknown   GERD (gastroesophageal reflux disease) Unknown   Hyperlipidemia, mixed 10/06/2016    GENERAL REVIEW OF SYSTEMS:   General ROS: negative for - chills, fatigue, fever, weight gain or weight loss Allergy and Immunology ROS: negative for - hives   Hematological and Lymphatic ROS: negative for - bleeding problems or bruising, negative for palpable nodes Endocrine ROS: negative for - heat or cold intolerance, hair changes Respiratory ROS: negative for - cough, shortness of breath or wheezing Cardiovascular ROS: no chest pain or palpitations GI ROS: negative for nausea, vomiting, abdominal pain, diarrhea, constipation Musculoskeletal ROS: negative for - joint swelling or muscle pain Neurological ROS: negative for - confusion, syncope Dermatological ROS: negative for pruritus and rash Psychiatric: negative for anxiety, depression, difficulty sleeping and memory loss  MEDICATIONS: Current Outpatient Medications  Medication Sig Dispense Refill   acetaminophen (TYLENOL) 500 MG tablet Take 1,000 mg by mouth     amLODIPine (NORVASC) 5 MG tablet TAKE 1 TABLET BY MOUTH TWICE A DAY 180 tablet 0   calcium carbonate-vitamin D3 (OS-CAL 500+D) 500 mg(1,250mg ) -200 unit tablet Take 1 tablet by mouth 2 (two) times daily with meals. (Patient taking differently: Take 1 tablet by mouth once daily **2 gummies daily**) 60 tablet 0   diphenhydrAMINE (BENADRYL) 25 mg tablet Take 25 mg by mouth every 6 (six) hours as needed for itching.     docusate (COLACE) 100 MG capsule Take 100 mg by mouth 2 (two) times daily     famotidine (PEPCID) 10 MG tablet Take 10 mg by mouth 2 (two) times daily     HYDROcodone-acetaminophen (NORCO) 5-325 mg tablet Take 1 tablet by mouth every 6 (six) hours as needed for Pain for up to 30 doses 30  tablet 0   hydrocortisone 2.5 % ointment      latanoprost (XALATAN) 0.005 % ophthalmic solution INSTILL 1 DROP INTO RIGHT EYE AT NIGHT     lisinopriL (ZESTRIL) 20 MG tablet Take 1 tablet (20 mg total) by mouth 2 (two) times daily 60 tablet 11   melatonin 5 mg Tab Take by mouth     meloxicam (MOBIC) 7.5 MG tablet TAKE 2 TABLETS BY MOUTH ONCE EVERY DAY 60 tablet 3   multivit,iron,minerals/lutein (CENTRUM SILVER ULTRA WOMEN'S ORAL) Take 1  capsule by mouth once daily     omeprazole (PRILOSEC) 40 MG DR capsule TAKE 1 CAPSULE BY MOUTH EVERY DAY 90 capsule 3   ondansetron (ZOFRAN-ODT) 4 MG disintegrating tablet DISOLVE 1 TABLET BY MOUTH EVERY 8 HOURS AS NEEDED FOR NAUSEA OR VOMITING     propylene glycoL (SYSTANE COMPLETE) 0.6 % ophthalmic drops Apply to eye     simvastatin (ZOCOR) 10 MG tablet TAKE 1 TABLET BY MOUTH EVERY DAY AT NIGHT 90 tablet 3   tiZANidine (ZANAFLEX) 2 MG tablet Take 1 tablet (2 mg total) by mouth at bedtime as needed 30 tablet 1   clindamycin (CLEOCIN) 300 MG capsule TAKE 2 CAPSULES BY MOUTH 1 HOUR PRIOR TO DENTAL APPOINTMENT. (Patient not taking: Reported on 10/18/2022)     hydroCHLOROthiazide (HYDRODIURIL) 25 MG tablet TAKE 1 TABLET BY MOUTH EVERY DAY (Patient not taking: Reported on 10/18/2022) 90 tablet 0   lidocaine (LIDODERM) 5 % patch Place 1 patch onto the skin daily Apply patch to the most painful area for up to 12 hours in a 24 hour period. (Patient not taking: Reported on 09/21/2022) 15 patch 2   mupirocin (BACTROBAN) 2 % ointment mupirocin 2 % topical ointment  APPLY TOPICALLY 2 (TWO) TIMES DAILY FOR 7 DAYS OR UNTIL CLEARED     No current facility-administered medications for this visit.    ALLERGIES: Venom-honey bee, Bee venom protein (honey bee), Hydrocodone, Iodinated contrast media, Penicillin, Septra [sulfamethoxazole-trimethoprim], and Iodine  PAST MEDICAL HISTORY: Past Medical History:  Diagnosis Date   COVID-19 09/25/2020   Degenerative disc disease, thoracic    GERD (gastroesophageal reflux disease)    History of cellulitis 05/17/2017   Hx of cervical cancer 1988   Hypertension    Osteoarthritis (arthritis due to wear and tear of joints)    Neck,    PAT (paroxysmal atrial tachycardia)    SCC (squamous cell carcinoma)    Skin cancer    NECK    PAST SURGICAL HISTORY: Past Surgical History:  Procedure Laterality Date   shoulder surgery  Left 2014   partial removal of AC joint  because OA   KNEE ARTHROSCOPY Right 11/25/2019   Right knee arthroscopically assisted subchondroplasty of medial tibial plateau (treatment of medial tibial plateau fracture)   Right TKA using all-cemented Biomet Vanguard system with a 65 mm PCR femur, a 71 mm tibial tray with a 10 mm anterior stabilized E-poly insert, and a 34 x 7.8 mm all-poly 3-pegged domed patella Right 01/26/2022   Dr.Poggi   ABDOMINAL HYSTERECTOMY     BREAST BIOPSY NEG BX/CLIP RIGHT Right    CATARACT EXTRACTION Bilateral    CATARACT EXTRACTION W/ INTRAOCULAR LENS IMPLANT, BILATERAL   HYSTERECTOMY     oopherectomy Right    TONSILLECTOMY       FAMILY HISTORY: Family History  Problem Relation Name Age of Onset   Breast cancer Mother     Skin cancer Mother     Kidney cancer Mother  Prostate cancer Father     Lung cancer Father     No Known Problems Daughter     No Known Problems Son       SOCIAL HISTORY: Social History   Socioeconomic History   Marital status: Divorced   Number of children: 2   Years of education: 16   Highest education level: Bachelor's degree (e.g., BA, AB, BS)  Tobacco Use   Smoking status: Former    Current packs/day: 0.00    Average packs/day: 1.5 packs/day for 12.0 years (18.0 ttl pk-yrs)    Types: Cigarettes    Start date: 3    Quit date: 63    Years since quitting: 32.4   Smokeless tobacco: Never  Vaping Use   Vaping status: Never Used  Substance and Sexual Activity   Alcohol use: Yes    Alcohol/week: 3.0 standard drinks of alcohol    Types: 3 Shots of liquor per week    Comment: 7 (1 standard drink = 0.6 oz pure alcohol   Drug use: No   Sexual activity: Not Currently    Partners: Male  Social History Narrative   12/14/2021   Military Service: Cabin crew.    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 Last screening Date: April 2023 Screening is: Up to date. [facility]   Vision: Denies any issues with Vision","Wears Reading glasses","Prescription Glasses","Contact Lenses"}.. Last screening Date: July 2023 Screening is: Up to date. [facility]   Hearing: Denies any issues in Hearing .    Dermatology: Denies any areas of concern on skin. Last screening Date: May 2023 Screening is: Up to date. [facility]       Social Determinants of Health   Financial Resource Strain: Low Risk  (12/14/2021)   Overall Financial Resource Strain (CARDIA)    Difficulty of Paying Living Expenses: Not hard at all  Food Insecurity: Patient Declined (01/26/2022)   Received from [facility], [place]   Hunger Vital Sign    Worried About Running Out of Food in the Last Year: Patient declined    Ran Out of Food in the Last Year: Patient declined  Transportation Needs: No Transportation Needs (01/26/2022)   Received from [facility], [place]   PRAPARE - Transportation    Lack of Transportation (Medical): No    Lack of Transportation (Non-Medical): No    PHYSICAL EXAM: Vitals:   10/18/22 1441  BP: 130/61  Pulse: 87   Body mass index is 26.78 kg/m. Weight: 77.6 kg (171 lb)   GENERAL: Alert, active, oriented x3  HEENT: Pupils equal reactive to light. Extraocular movements are intact. Sclera clear. Palpebral conjunctiva normal red color.Pharynx clear.  NECK: Supple with no palpable mass and no adenopathy.  LUNGS: Sound clear with no rales rhonchi or wheezes.  HEART: Regular rhythm S1 and S2 without murmur.  BREAST: breasts appear normal, no suspicious masses, no skin or nipple changes or axillary nodes.  ABDOMEN: Soft and depressible, nontender with no palpable mass, no hepatomegaly.  EXTREMITIES: Well-developed  well-nourished symmetrical with no dependent edema.  NEUROLOGICAL: Awake alert oriented, facial expression symmetrical, moving all  extremities.  REVIEW OF DATA: I have reviewed the following data today: Office Visit on 10/10/2022  Component Date Value   Sodium 10/10/2022 132 (L)    Potassium 10/10/2022 3.4 (L)    Chloride 10/10/2022 93 (L)    Carbon Dioxide (CO2) 10/10/2022 28    Urea Nitrogen (BUN) 10/10/2022 15    Creatinine 10/10/2022 1.0    Glucose 10/10/2022 102    Calcium 10/10/2022 9.2    Anion Gap 10/10/2022 11    BUN/CREA Ratio 10/10/2022 15    Glomerular Filtration Ra* 10/10/2022 58    Cholesterol, Total 10/10/2022 150    LDL Calculated 10/10/2022 65    HDL 10/10/2022 60    Triglyceride 10/10/2022 125      ASSESSMENT: Ms. Gagan is a 77 y.o. female presenting for consultation for right breast cancer.    Patient was oriented again about the pathology results. Surgical alternatives were discussed with patient including partial vs total mastectomy. Surgical technique and post operative care was discussed with patient. Risk of surgery was discussed with patient including but not limited to: wound infection, seroma, hematoma, brachial plexopathy, mondor's disease (thrombosis of small veins of breast), chronic wound pain, breast lymphedema, altered sensation to the nipple and cosmesis among others.   Malignant neoplasm of lower-inner quadrant of right breast of female, estrogen receptor positive (CMS/HHS-HCC) [C50.311, Z17.0]  PLAN: 1.  Right breast radiofrequency tag partial mastectomy with right axillary sentinel lymph node biopsy (19301, [id number]) 2.  Avoid taking aspirin 5 days before the surgery 3.  Contact us if you have any concern  Patient verbalized understanding, all questions were answered, and were agreeable with the plan outlined above.     Carolan Shiver, MD  Electronically signed by Carolan Shiver, MD

## 2022-10-19 NOTE — Progress Notes (Signed)
Lumpectomy is scheduled for 6/19.  She will see Dr. Rushie Chestnut and Dr. Alena Bills on 7/11.  Appt. Details given to her.

## 2022-10-19 NOTE — H&P (Signed)
PATIENT PROFILE: Megan Montgomery is a 76 y.o. female who presents to the Clinic for consultation at the request of Dr. Olmedo for evaluation of right breast cancer.  PCP:  Olmedo, Mario Ernesto, MD  HISTORY OF PRESENT ILLNESS: Megan Montgomery reports had her usual screening mammogram on 09/26/2022.  She was found with a 9 mm spiculated mass of the right breast at the 330 o'clock position.  She had a core biopsy that showed invasive mammary carcinoma, no special type, ER positive PR positive, HER2 acute recall but negative by FISH.  Patient denies any previous breast pain, palpable masses, nipple discharge or skin changes.  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   Status post total knee replacement using cement, right 01/27/2022   Primary osteoarthritis of right knee 12/29/2021   Cervical spondylosis 09/23/2021   Partial thickness rotator cuff tear 09/23/2021   Abnormal ECG 10/09/2020   SOBOE (shortness of breath on exertion) 10/09/2020   Cellulitis 05/17/2017   Screening for osteoporosis 10/12/2016   Chronic midline low back pain without sciatica 10/12/2016   SI (sacroiliac) pain 10/12/2016   Osteoarthritis (arthritis due to wear and tear of joints) Unknown   Overview    Neck, lower back        Essential hypertension Unknown   Hx of cervical cancer Unknown   GERD (gastroesophageal reflux disease) Unknown   Hyperlipidemia, mixed 10/06/2016    GENERAL REVIEW OF SYSTEMS:   General ROS: negative for - chills, fatigue, fever, weight gain or weight loss Allergy and Immunology ROS: negative for - hives   Hematological and Lymphatic ROS: negative for - bleeding problems or bruising, negative for palpable nodes Endocrine ROS: negative for - heat or cold intolerance, hair changes Respiratory ROS: negative for - cough, shortness of breath or wheezing Cardiovascular ROS: no chest pain or palpitations GI ROS: negative for nausea, vomiting, abdominal pain, diarrhea, constipation Musculoskeletal ROS: negative for - joint swelling or muscle pain Neurological ROS: negative for - confusion, syncope Dermatological ROS: negative for pruritus and rash Psychiatric: negative for anxiety, depression, difficulty sleeping and memory loss  MEDICATIONS: Current Outpatient Medications  Medication Sig Dispense Refill   acetaminophen (TYLENOL) 500 MG tablet Take 1,000 mg by mouth     amLODIPine (NORVASC) 5 MG tablet TAKE 1 TABLET BY MOUTH TWICE A DAY 180 tablet 0   calcium carbonate-vitamin D3 (OS-CAL 500+D) 500 mg(1,250mg) -200 unit tablet Take 1 tablet by mouth 2 (two) times daily with meals. (Patient taking differently: Take 1 tablet by mouth once daily **2 gummies daily**) 60 tablet 0   diphenhydrAMINE (BENADRYL) 25 mg tablet Take 25 mg by mouth every 6 (six) hours as needed for itching.     docusate (COLACE) 100 MG capsule Take 100 mg by mouth 2 (two) times daily     famotidine (PEPCID) 10 MG tablet Take 10 mg by mouth 2 (two) times daily     HYDROcodone-acetaminophen (NORCO) 5-325 mg tablet Take 1 tablet by mouth every 6 (six) hours as needed for Pain for up to 30 doses 30   tablet 0   hydrocortisone 2.5 % ointment      latanoprost (XALATAN) 0.005 % ophthalmic solution INSTILL 1 DROP INTO RIGHT EYE AT NIGHT     lisinopriL (ZESTRIL) 20 MG tablet Take 1 tablet (20 mg total) by mouth 2 (two) times daily 60 tablet 11   melatonin 5 mg Tab Take by mouth     meloxicam (MOBIC) 7.5 MG tablet TAKE 2 TABLETS BY MOUTH ONCE EVERY DAY 60 tablet 3   multivit,iron,minerals/lutein (CENTRUM SILVER ULTRA WOMEN'S ORAL) Take 1  capsule by mouth once daily     omeprazole (PRILOSEC) 40 MG DR capsule TAKE 1 CAPSULE BY MOUTH EVERY DAY 90 capsule 3   ondansetron (ZOFRAN-ODT) 4 MG disintegrating tablet DISOLVE 1 TABLET BY MOUTH EVERY 8 HOURS AS NEEDED FOR NAUSEA OR VOMITING     propylene glycoL (SYSTANE COMPLETE) 0.6 % ophthalmic drops Apply to eye     simvastatin (ZOCOR) 10 MG tablet TAKE 1 TABLET BY MOUTH EVERY DAY AT NIGHT 90 tablet 3   tiZANidine (ZANAFLEX) 2 MG tablet Take 1 tablet (2 mg total) by mouth at bedtime as needed 30 tablet 1   clindamycin (CLEOCIN) 300 MG capsule TAKE 2 CAPSULES BY MOUTH 1 HOUR PRIOR TO DENTAL APPOINTMENT. (Patient not taking: Reported on 10/18/2022)     hydroCHLOROthiazide (HYDRODIURIL) 25 MG tablet TAKE 1 TABLET BY MOUTH EVERY DAY (Patient not taking: Reported on 10/18/2022) 90 tablet 0   lidocaine (LIDODERM) 5 % patch Place 1 patch onto the skin daily Apply patch to the most painful area for up to 12 hours in a 24 hour period. (Patient not taking: Reported on 09/21/2022) 15 patch 2   mupirocin (BACTROBAN) 2 % ointment mupirocin 2 % topical ointment  APPLY TOPICALLY 2 (TWO) TIMES DAILY FOR 7 DAYS OR UNTIL CLEARED     No current facility-administered medications for this visit.    ALLERGIES: Venom-honey bee, Bee venom protein (honey bee), Hydrocodone, Iodinated contrast media, Penicillin, Septra [sulfamethoxazole-trimethoprim], and Iodine  PAST MEDICAL HISTORY: Past Medical History:  Diagnosis Date   COVID-19 09/25/2020   Degenerative disc disease, thoracic    GERD (gastroesophageal reflux disease)    History of cellulitis 05/17/2017   Hx of cervical cancer 1988   Hypertension    Osteoarthritis (arthritis due to wear and tear of joints)    Neck,    PAT (paroxysmal atrial tachycardia)    SCC (squamous cell carcinoma)    Skin cancer    NECK    PAST SURGICAL HISTORY: Past Surgical History:  Procedure Laterality Date   shoulder surgery  Left 2014   partial removal of AC joint  because OA   KNEE ARTHROSCOPY Right 11/25/2019   Right knee arthroscopically assisted subchondroplasty of medial tibial plateau (treatment of medial tibial plateau fracture)   Right TKA using all-cemented Biomet Vanguard system with a 65 mm PCR femur, a 71 mm tibial tray with a 10 mm anterior stabilized E-poly insert, and a 34 x 7.8 mm all-poly 3-pegged domed patella Right 01/26/2022   Dr.Poggi   ABDOMINAL HYSTERECTOMY     BREAST BIOPSY NEG BX/CLIP RIGHT Right    CATARACT EXTRACTION Bilateral    CATARACT EXTRACTION W/ INTRAOCULAR LENS IMPLANT, BILATERAL   HYSTERECTOMY     oopherectomy Right    TONSILLECTOMY       FAMILY HISTORY: Family History  Problem Relation Name Age of Onset   Breast cancer Mother     Skin cancer Mother     Kidney cancer Mother       Prostate cancer Father     Lung cancer Father     No Known Problems Daughter     No Known Problems Son       SOCIAL HISTORY: Social History   Socioeconomic History   Marital status: Divorced   Number of children: 2   Years of education: 16   Highest education level: Bachelor's degree (e.g., BA, AB, BS)  Tobacco Use   Smoking status: Former    Current packs/day: 0.00    Average packs/day: 1.5 packs/day for 12.0 years (18.0 ttl pk-yrs)    Types: Cigarettes    Start date: 1980    Quit date: 1992    Years since quitting: 32.4   Smokeless tobacco: Never  Vaping Use   Vaping status: Never Used  Substance and Sexual Activity   Alcohol use: Yes    Alcohol/week: 3.0 standard drinks of alcohol    Types: 3 Shots of liquor per week    Comment: 7 (1 standard drink = 0.6 oz pure alcohol   Drug use: No   Sexual activity: Not Currently    Partners: Male  Social History Narrative   12/14/2021   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    Likes/Enjoys/What fills your day?:  Narrative   Home: One Story   Your Bedrooms is on: First Level   Fewest Steps to enter the home: 0   Other persons in the home: lives alone   Pets: cat   Medical  equipment you use daily: Comfort type toilet seats, shower bench   Medical equipment available in the home: Cane and Rollator   Dental: no significant dental problems. Last screening Date: April 2023 Screening is: Up to date. [facility]   Vision: Denies any issues with Vision","Wears Reading glasses","Prescription Glasses","Contact Lenses"}.. Last screening Date: July 2023 Screening is: Up to date. [facility]   Hearing: Denies any issues in Hearing .    Dermatology: Denies any areas of concern on skin. Last screening Date: May 2023 Screening is: Up to date. [facility]       Social Determinants of Health   Financial Resource Strain: Low Risk  (12/14/2021)   Overall Financial Resource Strain (CARDIA)    Difficulty of Paying Living Expenses: Not hard at all  Food Insecurity: Patient Declined (01/26/2022)   Received from [facility], [place]   Hunger Vital Sign    Worried About Running Out of Food in the Last Year: Patient declined    Ran Out of Food in the Last Year: Patient declined  Transportation Needs: No Transportation Needs (01/26/2022)   Received from [place], [place]   PRAPARE - Transportation    Lack of Transportation (Medical): No    Lack of Transportation (Non-Medical): No    PHYSICAL EXAM: Vitals:   10/18/22 1441  BP: 130/61  Pulse: 87   Body mass index is 26.78 kg/m. Weight: 77.6 kg (171 lb)   GENERAL: Alert, active, oriented x3  HEENT: Pupils equal reactive to light. Extraocular movements are intact. Sclera clear. Palpebral conjunctiva normal red color.Pharynx clear.  NECK: Supple with no palpable mass and no adenopathy.  LUNGS: Sound clear with no rales rhonchi or wheezes.  HEART: Regular rhythm S1 and S2 without murmur.  BREAST: breasts appear normal, no suspicious masses, no skin or nipple changes or axillary nodes.  ABDOMEN: Soft and depressible, nontender with no palpable mass, no hepatomegaly.  EXTREMITIES: Well-developed  well-nourished symmetrical with no dependent edema.  NEUROLOGICAL: Awake alert oriented, facial expression symmetrical, moving all   extremities.  REVIEW OF DATA: I have reviewed the following data today: Office Visit on 10/10/2022  Component Date Value   Sodium 10/10/2022 132 (L)    Potassium 10/10/2022 3.4 (L)    Chloride 10/10/2022 93 (L)    Carbon Dioxide (CO2) 10/10/2022 28    Urea Nitrogen (BUN) 10/10/2022 15    Creatinine 10/10/2022 1.0    Glucose 10/10/2022 102    Calcium 10/10/2022 9.2    Anion Gap 10/10/2022 11    BUN/CREA Ratio 10/10/2022 15    Glomerular Filtration Ra* 10/10/2022 58    Cholesterol, Total 10/10/2022 150    LDL Calculated 10/10/2022 65    HDL 10/10/2022 60    Triglyceride 10/10/2022 125      ASSESSMENT: Megan Montgomery is a 76 y.o. female presenting for consultation for right breast cancer.    Patient was oriented again about the pathology results. Surgical alternatives were discussed with patient including partial vs total mastectomy. Surgical technique and post operative care was discussed with patient. Risk of surgery was discussed with patient including but not limited to: wound infection, seroma, hematoma, brachial plexopathy, mondor's disease (thrombosis of small veins of breast), chronic wound pain, breast lymphedema, altered sensation to the nipple and cosmesis among others.   Malignant neoplasm of lower-inner quadrant of right breast of female, estrogen receptor positive (CMS/HHS-HCC) [C50.311, Z17.0]  PLAN: 1.  Right breast radiofrequency tag partial mastectomy with right axillary sentinel lymph node biopsy ([id number], [id number]) 2.  Avoid taking aspirin 5 days before the surgery 3.  Contact us if you have any concern  Patient verbalized understanding, all questions were answered, and were agreeable with the plan outlined above.     Adrean Heitz Cintron-Diaz, MD  Electronically signed by Burnadette Baskett Cintron-Diaz, MD  

## 2022-10-21 ENCOUNTER — Encounter
Admission: RE | Admit: 2022-10-21 | Discharge: 2022-10-21 | Disposition: A | Discharge status: Home / Self Care | Payer: Medicare Other | Source: Ambulatory Visit | Attending: General Surgery | Admitting: General Surgery

## 2022-10-21 DIAGNOSIS — Z01812 Encounter for preprocedural laboratory examination: Secondary | ICD-10-CM

## 2022-10-21 DIAGNOSIS — Z01810 Encounter for preprocedural cardiovascular examination: Secondary | ICD-10-CM

## 2022-10-21 DIAGNOSIS — I10 Essential (primary) hypertension: Secondary | ICD-10-CM

## 2022-10-21 NOTE — Patient Instructions (Signed)
Your procedure is scheduled on:10-26-22 Wednesday Report to the Registration Desk on the 1st floor of the [facility].Then proceed to the Radiology Desk (2nd desk on right). Arrive at 8:30 am  REMEMBER: Instructions that are not followed completely may result in serious medical risk, up to and including death; or upon the discretion of your surgeon and anesthesiologist your surgery may need to be rescheduled.  Do not eat food OR drink any liquids after midnight the night before surgery.  No gum chewing or hard candies.  One week prior to surgery:Last dose will be on 10-21-22 Stop Anti-inflammatories (NSAIDS) such as Advil, Aleve, Ibuprofen, Motrin, Naproxen, Naprosyn and Aspirin based products such as Excedrin, Goody's Powder, BC Powder.You may however, continue to take Tylenol if needed for pain up until the day of surgery. Stop ANY OVER THE COUNTER supplements/vitamins NOW (10-21-22) until after surgery (Calcium + D, Multivitamin)-You may continue your Melatonin up until the night prior to surgery   Continue taking all prescribed medications with the exception of the following: -Stop your meloxicam (MOBIC) NOW (10-21-22)  TAKE ONLY THESE MEDICATIONS THE MORNING OF SURGERY WITH A SIP OF WATER: -amLODipine (NORVASC)  -omeprazole (PRILOSEC)-take one the night before and one on the morning of surgery - helps to prevent nausea after surgery.)  No Alcohol for 24 hours before or after surgery.  No Smoking including e-cigarettes for 24 hours before surgery.  No chewable tobacco products for at least 6 hours before surgery.  No nicotine patches on the day of surgery.  Do not use any "recreational" drugs for at least a week (preferably 2 weeks) before your surgery.  Please be advised that the combination of cocaine and anesthesia may have negative outcomes, up to and including death. If you test positive for cocaine, your surgery will be cancelled.  On the morning of surgery brush your teeth with  toothpaste and water, you may rinse your mouth with mouthwash if you wish. Do not swallow any toothpaste or mouthwash.  Use CHG Soap as directed on instruction sheet.  Do not wear jewelry, make-up, hairpins, clips or nail polish.  Do not wear lotions, powders, or perfumes.   Do not shave body hair from the neck down 48 hours before surgery.  Contact lenses, hearing aids and dentures may not be worn into surgery.  Do not bring valuables to the hospital. [facility] is not responsible for any missing/lost belongings or valuables.   Notify your doctor if there is any change in your medical condition (cold, fever, infection).  Wear comfortable clothing (specific to your surgery type) to the hospital.  After surgery, you can help prevent lung complications by doing breathing exercises.  Take deep breaths and cough every 1-2 hours. Your doctor may order a device called an Incentive Spirometer to help you take deep breaths. When coughing or sneezing, hold a pillow firmly against your incision with both hands. This is called "splinting." Doing this helps protect your incision. It also decreases belly discomfort.  If you are being admitted to the hospital overnight, leave your suitcase in the car. After surgery it may be brought to your room.  In case of increased patient census, it may be necessary for you, the patient, to continue your postoperative care in the Same Day Surgery department.  If you are being discharged the day of surgery, you will not be allowed to drive home. You will need a responsible individual to drive you home and stay with you for 24 hours after surgery.  If you are taking public transportation, you will need to have a responsible individual with you.  Please call the Pre-admissions Testing Dept. at [phone] if you have any questions about these instructions.  Surgery Visitation Policy:  Patients having surgery or a procedure may have two visitors.   Children under the age of 72 must have an adult with them who is not the patient.     Preparing for Surgery with CHLORHEXIDINE GLUCONATE (CHG) Soap  Chlorhexidine Gluconate (CHG) Soap  o An antiseptic cleaner that kills germs and bonds with the skin to continue killing germs even after washing  o Used for showering the night before surgery and morning of surgery  Before surgery, you can play an important role by reducing the number of germs on your skin.  CHG (Chlorhexidine gluconate) soap is an antiseptic cleanser which kills germs and bonds with the skin to continue killing germs even after washing.  Please do not use if you have an allergy to CHG or antibacterial soaps. If your skin becomes reddened/irritated stop using the CHG.  1. Shower the NIGHT BEFORE SURGERY and the MORNING OF SURGERY with CHG soap.  2. If you choose to wash your hair, wash your hair first as usual with your normal shampoo.  3. After shampooing, rinse your hair and body thoroughly to remove the shampoo.  4. Use CHG as you would any other liquid soap. You can apply CHG directly to the skin and wash gently with a scrungie or a clean washcloth.  5. Apply the CHG soap to your body only from the neck down. Do not use on open wounds or open sores. Avoid contact with your eyes, ears, mouth, and genitals (private parts). Wash face and genitals (private parts) with your normal soap.  6. Wash thoroughly, paying special attention to the area where your surgery will be performed.  7. Thoroughly rinse your body with warm water.  8. Do not shower/wash with your normal soap after using and rinsing off the CHG soap.  9. Pat yourself dry with a clean towel.  10. Wear clean pajamas to bed the night before surgery.  12. Place clean sheets on your bed the night of your first shower and do not sleep with pets.  13. Shower again with the CHG soap on the day of surgery prior to arriving at the hospital.  14. Do not  apply any deodorants/lotions/powders.  15. Please wear clean clothes to the hospital.

## 2022-10-24 ENCOUNTER — Encounter
Admission: RE | Admit: 2022-10-24 | Discharge: 2022-10-24 | Disposition: A | Discharge status: Home / Self Care | Payer: Medicare Other | Source: Ambulatory Visit | Attending: General Surgery | Admitting: General Surgery

## 2022-10-24 ENCOUNTER — Ambulatory Visit
Admission: RE | Admit: 2022-10-24 | Discharge: 2022-10-24 | Disposition: A | Discharge status: Home / Self Care | Payer: Medicare Other | Source: Ambulatory Visit | Attending: General Surgery | Admitting: General Surgery

## 2022-10-24 ENCOUNTER — Other Ambulatory Visit: Payer: Self-pay | Admitting: General Surgery

## 2022-10-24 DIAGNOSIS — Z170 Estrogen receptor positive status [ER+]: Secondary | ICD-10-CM | POA: Insufficient documentation

## 2022-10-24 DIAGNOSIS — C50311 Malignant neoplasm of lower-inner quadrant of right female breast: Secondary | ICD-10-CM | POA: Insufficient documentation

## 2022-10-24 DIAGNOSIS — Z01812 Encounter for preprocedural laboratory examination: Secondary | ICD-10-CM

## 2022-10-24 DIAGNOSIS — I10 Essential (primary) hypertension: Secondary | ICD-10-CM | POA: Insufficient documentation

## 2022-10-24 DIAGNOSIS — Z01818 Encounter for other preprocedural examination: Secondary | ICD-10-CM | POA: Insufficient documentation

## 2022-10-24 DIAGNOSIS — Z01810 Encounter for preprocedural cardiovascular examination: Secondary | ICD-10-CM

## 2022-10-24 LAB — CBC
HCT: 33.3 % — ABNORMAL LOW (ref 36.0–46.0)
Hemoglobin: 11.5 g/dL — ABNORMAL LOW (ref 12.0–15.0)
MCH: 31.1 pg (ref 26.0–34.0)
MCHC: 34.5 g/dL (ref 30.0–36.0)
MCV: 90 fL (ref 80.0–100.0)
Platelets: 411 10*3/uL — ABNORMAL HIGH (ref 150–400)
RBC: 3.7 MIL/uL — ABNORMAL LOW (ref 3.87–5.11)
RDW: 14 % (ref 11.5–15.5)
WBC: 7.8 10*3/uL (ref 4.0–10.5)
nRBC: 0 % (ref 0.0–0.2)

## 2022-10-24 MED ORDER — LIDOCAINE HCL 1 % IJ SOLN
5.0000 mL | Freq: Once | INTRAMUSCULAR | Status: AC
  Administered 2022-10-24: 5 mL
  Filled 2022-10-24: qty 5

## 2022-10-25 MED ORDER — CEFAZOLIN SODIUM-DEXTROSE 2-4 GM/100ML-% IV SOLN
2.0000 g | INTRAVENOUS | Status: AC
  Administered 2022-10-26: 2 g via INTRAVENOUS

## 2022-10-25 MED ORDER — CHLORHEXIDINE GLUCONATE 0.12 % MT SOLN
15.0000 mL | Freq: Once | OROMUCOSAL | Status: AC
  Administered 2022-10-26: 15 mL via OROMUCOSAL

## 2022-10-25 MED ORDER — ORAL CARE MOUTH RINSE: 15.0000 mL | Freq: Once | OROMUCOSAL | Status: AC

## 2022-10-25 MED ORDER — LACTATED RINGERS IV SOLN: INTRAVENOUS | Status: DC

## 2022-10-26 ENCOUNTER — Other Ambulatory Visit: Payer: Self-pay

## 2022-10-26 ENCOUNTER — Ambulatory Visit: Payer: Medicare Other | Admitting: Certified Registered Nurse Anesthetist

## 2022-10-26 ENCOUNTER — Ambulatory Visit: Payer: Medicare Other | Admitting: Urgent Care

## 2022-10-26 ENCOUNTER — Ambulatory Visit
Admission: RE | Admit: 2022-10-26 | Discharge: 2022-10-26 | Disposition: A | Discharge status: Home / Self Care | Payer: Medicare Other | Source: Ambulatory Visit | Attending: General Surgery | Admitting: General Surgery

## 2022-10-26 ENCOUNTER — Encounter: Payer: Self-pay | Admitting: General Surgery

## 2022-10-26 ENCOUNTER — Ambulatory Visit
Admission: RE | Admit: 2022-10-26 | Discharge: 2022-10-26 | Disposition: A | Discharge status: Home / Self Care | Payer: Medicare Other | Attending: General Surgery | Admitting: General Surgery

## 2022-10-26 ENCOUNTER — Encounter
Admission: RE | Disposition: A | Discharge status: Home / Self Care | Payer: Self-pay | Source: Home / Self Care | Attending: General Surgery

## 2022-10-26 DIAGNOSIS — C50311 Malignant neoplasm of lower-inner quadrant of right female breast: Secondary | ICD-10-CM

## 2022-10-26 DIAGNOSIS — I10 Essential (primary) hypertension: Secondary | ICD-10-CM | POA: Insufficient documentation

## 2022-10-26 DIAGNOSIS — Z170 Estrogen receptor positive status [ER+]: Secondary | ICD-10-CM | POA: Diagnosis not present

## 2022-10-26 DIAGNOSIS — Z8541 Personal history of malignant neoplasm of cervix uteri: Secondary | ICD-10-CM | POA: Diagnosis not present

## 2022-10-26 DIAGNOSIS — Z803 Family history of malignant neoplasm of breast: Secondary | ICD-10-CM | POA: Diagnosis not present

## 2022-10-26 DIAGNOSIS — Z79899 Other long term (current) drug therapy: Secondary | ICD-10-CM | POA: Diagnosis not present

## 2022-10-26 DIAGNOSIS — Z85828 Personal history of other malignant neoplasm of skin: Secondary | ICD-10-CM | POA: Insufficient documentation

## 2022-10-26 DIAGNOSIS — Z87891 Personal history of nicotine dependence: Secondary | ICD-10-CM | POA: Insufficient documentation

## 2022-10-26 DIAGNOSIS — M1990 Unspecified osteoarthritis, unspecified site: Secondary | ICD-10-CM | POA: Insufficient documentation

## 2022-10-26 DIAGNOSIS — Z800 Family history of malignant neoplasm of digestive organs: Secondary | ICD-10-CM | POA: Diagnosis not present

## 2022-10-26 DIAGNOSIS — K219 Gastro-esophageal reflux disease without esophagitis: Secondary | ICD-10-CM | POA: Diagnosis not present

## 2022-10-26 HISTORY — PX: PART MASTECTOMY,RADIO FREQUENCY LOCALIZER,AXILLARY SENTINEL NODE BIOPSY: SHX6901

## 2022-10-26 HISTORY — PX: BREAST LUMPECTOMY: SHX2

## 2022-10-26 SURGERY — PART MASTECTOMY,RADIO FREQUENCY LOCALIZER,AXILLARY SENTINEL NODE BIOPSY
Anesthesia: General | Site: Breast | Laterality: Right

## 2022-10-26 MED ORDER — PHENYLEPHRINE 80 MCG/ML (10ML) SYRINGE FOR IV PUSH (FOR BLOOD PRESSURE SUPPORT)
PREFILLED_SYRINGE | INTRAVENOUS | Status: AC
  Filled 2022-10-26: qty 10

## 2022-10-26 MED ORDER — BUPIVACAINE-EPINEPHRINE (PF) 0.5% -1:200000 IJ SOLN
INTRAMUSCULAR | Status: DC | PRN
  Administered 2022-10-26: 30 mL

## 2022-10-26 MED ORDER — FENTANYL CITRATE (PF) 100 MCG/2ML IJ SOLN
25.0000 ug | INTRAMUSCULAR | Status: DC | PRN
  Administered 2022-10-26 (×4): 25 ug via INTRAVENOUS

## 2022-10-26 MED ORDER — EPHEDRINE SULFATE (PRESSORS) 50 MG/ML IJ SOLN
INTRAMUSCULAR | Status: DC | PRN
  Administered 2022-10-26 (×3): 5 mg via INTRAVENOUS

## 2022-10-26 MED ORDER — DEXAMETHASONE SODIUM PHOSPHATE 10 MG/ML IJ SOLN
INTRAMUSCULAR | Status: DC | PRN
  Administered 2022-10-26: 10 mg via INTRAVENOUS

## 2022-10-26 MED ORDER — PROPOFOL 10 MG/ML IV BOLUS
INTRAVENOUS | Status: AC
  Filled 2022-10-26: qty 20

## 2022-10-26 MED ORDER — ONDANSETRON HCL 4 MG/2ML IJ SOLN
INTRAMUSCULAR | Status: DC | PRN
  Administered 2022-10-26: 4 mg via INTRAVENOUS

## 2022-10-26 MED ORDER — ACETAMINOPHEN 10 MG/ML IV SOLN
INTRAVENOUS | Status: AC
  Filled 2022-10-26: qty 100

## 2022-10-26 MED ORDER — ACETAMINOPHEN 10 MG/ML IV SOLN
INTRAVENOUS | Status: DC | PRN
  Administered 2022-10-26: 1000 mg via INTRAVENOUS

## 2022-10-26 MED ORDER — FENTANYL CITRATE (PF) 100 MCG/2ML IJ SOLN
INTRAMUSCULAR | Status: AC
  Filled 2022-10-26: qty 2

## 2022-10-26 MED ORDER — DROPERIDOL 2.5 MG/ML IJ SOLN: 0.6250 mg | Freq: Once | INTRAMUSCULAR | Status: DC | PRN

## 2022-10-26 MED ORDER — FENTANYL CITRATE (PF) 100 MCG/2ML IJ SOLN
INTRAMUSCULAR | Status: DC | PRN
  Administered 2022-10-26 (×2): 25 ug via INTRAVENOUS

## 2022-10-26 MED ORDER — MIDAZOLAM HCL 2 MG/2ML IJ SOLN
INTRAMUSCULAR | Status: DC | PRN
  Administered 2022-10-26: 2 mg via INTRAVENOUS

## 2022-10-26 MED ORDER — PROPOFOL 10 MG/ML IV BOLUS
INTRAVENOUS | Status: DC | PRN
  Administered 2022-10-26: 180 mg via INTRAVENOUS

## 2022-10-26 MED ORDER — LIDOCAINE HCL (CARDIAC) PF 100 MG/5ML IV SOSY
PREFILLED_SYRINGE | INTRAVENOUS | Status: DC | PRN
  Administered 2022-10-26: 100 mg via INTRAVENOUS

## 2022-10-26 MED ORDER — CEFAZOLIN SODIUM-DEXTROSE 2-4 GM/100ML-% IV SOLN
INTRAVENOUS | Status: AC
  Filled 2022-10-26: qty 100

## 2022-10-26 MED ORDER — TECHNETIUM TC 99M TILMANOCEPT KIT
1.0000 | PACK | Freq: Once | INTRAVENOUS | Status: AC
  Administered 2022-10-26: 1.0643 via INTRADERMAL

## 2022-10-26 MED ORDER — TRAMADOL HCL 50 MG PO TABS
50.0000 mg | ORAL_TABLET | Freq: Four times a day (QID) | ORAL | Status: AC | PRN
  Administered 2022-10-26: 50 mg via ORAL

## 2022-10-26 MED ORDER — TRAMADOL HCL 50 MG PO TABS: 50.0000 mg | ORAL_TABLET | Freq: Four times a day (QID) | ORAL | 0 refills | Status: DC | PRN

## 2022-10-26 MED ORDER — KETOROLAC TROMETHAMINE 30 MG/ML IJ SOLN
INTRAMUSCULAR | Status: DC | PRN
  Administered 2022-10-26: 30 mg via INTRAVENOUS

## 2022-10-26 MED ORDER — TRAMADOL HCL 50 MG PO TABS
ORAL_TABLET | ORAL | Status: AC
  Filled 2022-10-26: qty 1

## 2022-10-26 MED ORDER — EPHEDRINE 5 MG/ML INJ
INTRAVENOUS | Status: AC
  Filled 2022-10-26: qty 5

## 2022-10-26 MED ORDER — EPINEPHRINE PF 1 MG/ML IJ SOLN
INTRAMUSCULAR | Status: AC
  Filled 2022-10-26: qty 1

## 2022-10-26 MED ORDER — METHYLENE BLUE 1 % INJ SOLN
INTRAVENOUS | Status: AC
  Filled 2022-10-26: qty 10

## 2022-10-26 MED ORDER — MIDAZOLAM HCL 2 MG/2ML IJ SOLN
INTRAMUSCULAR | Status: AC
  Filled 2022-10-26: qty 2

## 2022-10-26 MED ORDER — PHENYLEPHRINE HCL (PRESSORS) 10 MG/ML IV SOLN
INTRAVENOUS | Status: DC | PRN
  Administered 2022-10-26: 160 ug via INTRAVENOUS

## 2022-10-26 MED ORDER — BUPIVACAINE HCL (PF) 0.5 % IJ SOLN
INTRAMUSCULAR | Status: AC
  Filled 2022-10-26: qty 30

## 2022-10-26 MED ORDER — CHLORHEXIDINE GLUCONATE 0.12 % MT SOLN
OROMUCOSAL | Status: AC
  Filled 2022-10-26: qty 15

## 2022-10-26 SURGICAL SUPPLY — 45 items
ADH SKN CLS APL DERMABOND .7 (GAUZE/BANDAGES/DRESSINGS) ×1
APL PRP STRL LF DISP 70% ISPRP (MISCELLANEOUS) ×1
BLADE SURG 15 STRL LF DISP TIS (BLADE) ×2 IMPLANT
BLADE SURG 15 STRL SS (BLADE) ×2
CHLORAPREP W/TINT 26 (MISCELLANEOUS) ×1 IMPLANT
CNTNR URN SCR LID CUP LEK RST (MISCELLANEOUS) IMPLANT
CONT SPEC 4OZ STRL OR WHT (MISCELLANEOUS)
COVER PROBE GAMMA FINDER SLV (MISCELLANEOUS) ×1 IMPLANT
DERMABOND ADVANCED .7 DNX12 (GAUZE/BANDAGES/DRESSINGS) ×1 IMPLANT
DEVICE DUBIN SPECIMEN MAMMOGRA (MISCELLANEOUS) ×1 IMPLANT
DRAPE LAPAROTOMY TRNSV 106X77 (MISCELLANEOUS) ×1 IMPLANT
DRSG GAUZE FLUFF 36X18 (GAUZE/BANDAGES/DRESSINGS) IMPLANT
ELECT CAUTERY BLADE 6.4 (BLADE) ×1 IMPLANT
ELECT REM PT RETURN 9FT ADLT (ELECTROSURGICAL) ×1
ELECTRODE REM PT RTRN 9FT ADLT (ELECTROSURGICAL) ×1 IMPLANT
GAUZE 4X4 16PLY ~~LOC~~+RFID DBL (SPONGE) ×1 IMPLANT
GLOVE BIO SURGEON STRL SZ 6.5 (GLOVE) ×1 IMPLANT
GLOVE BIOGEL PI IND STRL 6.5 (GLOVE) ×1 IMPLANT
GOWN STRL REUS W/ TWL LRG LVL3 (GOWN DISPOSABLE) ×2 IMPLANT
GOWN STRL REUS W/TWL LRG LVL3 (GOWN DISPOSABLE) ×2
KIT MARKER MARGIN INK (KITS) IMPLANT
KIT TURNOVER KIT A (KITS) ×1 IMPLANT
LABEL OR SOLS (LABEL) ×1 IMPLANT
MANIFOLD NEPTUNE II (INSTRUMENTS) ×1 IMPLANT
MARKER MARGIN CORRECT CLIP (MARKER) IMPLANT
NDL HYPO 22X1.5 SAFETY MO (MISCELLANEOUS) ×1 IMPLANT
NDL HYPO 25X1 1.5 SAFETY (NEEDLE) ×1 IMPLANT
NEEDLE HYPO 22X1.5 SAFETY MO (MISCELLANEOUS) ×1 IMPLANT
NEEDLE HYPO 25X1 1.5 SAFETY (NEEDLE) ×1 IMPLANT
PACK BASIN MINOR ARMC (MISCELLANEOUS) ×1 IMPLANT
RETRACTOR RING XSMALL (MISCELLANEOUS) IMPLANT
RTRCTR WOUND ALEXIS 13CM XS SH (MISCELLANEOUS) ×1
SET LOCALIZER 20 PROBE US (MISCELLANEOUS) ×1 IMPLANT
SUT MNCRL 4-0 (SUTURE) ×1
SUT MNCRL 4-0 27XMFL (SUTURE) ×1
SUT SILK 2 0 SH (SUTURE) ×1 IMPLANT
SUT VIC AB 3-0 SH 27 (SUTURE) ×1
SUT VIC AB 3-0 SH 27X BRD (SUTURE) ×2 IMPLANT
SUTURE MNCRL 4-0 27XMF (SUTURE) ×2 IMPLANT
SYR 10ML LL (SYRINGE) ×2 IMPLANT
SYR BULB IRRIG 60ML STRL (SYRINGE) ×1 IMPLANT
TRAP FLUID SMOKE EVACUATOR (MISCELLANEOUS) ×1 IMPLANT
TRAP NEPTUNE SPECIMEN COLLECT (MISCELLANEOUS) ×1 IMPLANT
WATER STERILE IRR 1000ML POUR (IV SOLUTION) ×1 IMPLANT
WATER STERILE IRR 500ML POUR (IV SOLUTION) ×1 IMPLANT

## 2022-10-26 NOTE — Op Note (Signed)
Preoperative diagnosis: Right breast carcinoma.  Postoperative diagnosis: Same.   Procedure: Right radiofrequency tag-localized partial mastectomy.                      Right Axillary Sentinel Lymph node biopsy  Anesthesia: GETA  Surgeon: Dr. Hazle Quant  Wound Classification: Clean  Indications: Patient is a 77 y.o. female with a nonpalpable right breast mass noted on mammography with core biopsy demonstrating invasive mammary carcinoma requires radiofrequency tag-localized partial mastectomy for treatment with sentinel lymph node biopsy.   Findings: 1. Specimen mammography shows marker and tag on specimen 2. Pathology call refers gross examination of margins was grossly negative 3. No other palpable mass or lymph node identified.   Description of procedure: Preoperative radiofrequency tag localization was performed by radiology. In the nuclear medicine suite, the subareolar region was injected with Tc-99 sulfur colloid. Localization studies were reviewed. The patient was taken to the operating room and placed supine on the operating table, and after general anesthesia the right chest and axilla were prepped and draped in the usual sterile fashion. A time-out was completed verifying correct patient, procedure, site, positioning, and implant(s) and/or special equipment prior to beginning this procedure.  By comparing the localization studies and interrogation with [facility] scout device, the probable trajectory and location of the mass was visualized. A circumareolar skin incision was planned in such a way as to minimize the amount of dissection to reach the mass.  The skin incision was made. Flaps were raised and the location of the tag was confirmed with [facility] scout device confirmed. A 2-0 silk figure-of-eight stay suture was placed and used for retraction. Dissection was then taken down circumferentially, taking care to include the entire localizing tag and a wide margin of grossly normal  tissue. The specimen and entire localizing tag were removed. The specimen was oriented and sent to radiology with the localization studies. Confirmation was received that the entire target lesion had been resected. The wound was irrigated. Hemostasis was checked.   A hand-held gamma probe was used to identify the location of the hottest spot in the axilla. An incision was made around the caudal axillary hairline. Dissection was carried down until subdermal facias was advanced. The probe was placed and again, the point of maximal count was found. Dissection continue until nodule was identified. The probe was placed in contact with the node. The node was excised in its entirety.  No additional hot spots were identified. No clinically abnormal nodes were palpated. The procedure was terminated. Hemostasis was achieved and the wound closed in layers with deep interrupted 3-0 Vicryl and skin was closed with subcuticular suture of Monocryl 3-0.   The breast wound was then approached for closure.  Eliminate dead space a tissue transfer technique was utilized.  The breast and pectoralis fascia was elevated off the underlying muscle and the serratus muscle circumferentially for a distance of about 30 square centimeters.  The fascial layer was then approximated with interrupted 2-0 Vicryl sutures.  The superficial layer of the breast parenchyma was then approximated in a similar fashion.  This was done in a radial direction.  The skin flaps were then elevated circumferentially to remove a ripple noted superiorly and medially. The skin was closed with 4-0 Monocryl. Dermabond was applied.  The patient tolerated the procedure well and was taken to the postanesthesia care unit in stable condition.   Sentinel Node Biopsy Synoptic Operative Report  Operation performed with curative intent:Yes  Tracer(s) used  to identify sentinel nodes in the upfront surgery (non-neoadjuvant) setting (select all that apply):Radioactive  Tracer  Tracer(s) used to identify sentinel nodes in the neoadjuvant setting (select all that apply):N/A  All nodes (colored or non-colored) present at the end of a dye-filled lymphatic channel were removed:N/A  All significantly radioactive nodes were removed:Yes  All palpable suspicious nodes were removed:N/A  Biopsy-proven positive nodes marked with clips prior to chemotherapy were identified and removed:N/A    Specimen: Right Breast mass                     Right axillary Sentinel Lymph node  Complications: None  Estimated Blood Loss: 5 mL

## 2022-10-26 NOTE — Interval H&P Note (Signed)
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  The various methods of treatment have been discussed with the patient and family. After consideration of risks, benefits and other options for treatment, the patient has consented to  Procedure(s): PART MASTECTOMY,RADIO FREQUENCY LOCALIZER,AXILLARY SENTINEL NODE BIOPSY (Right) as a surgical intervention.  The patient's history has been reviewed, patient examined, no change in status, stable for surgery.  I have reviewed the patient's chart and labs.  Questions were answered to the patient's satisfaction.     Carolan Shiver

## 2022-10-26 NOTE — Interval H&P Note (Signed)
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  The various methods of treatment have been discussed with the patient and family. After consideration of risks, benefits and other options for treatment, the patient has consented to  Procedure(s): PART MASTECTOMY,RADIO FREQUENCY LOCALIZER,AXILLARY SENTINEL NODE BIOPSY (Right) as a surgical intervention.  The patient's history has been reviewed, patient examined, no change in status, stable for surgery.  I have reviewed the patient's chart and labs.  Questions were answered to the patient's satisfaction.     Carolan Shiver

## 2022-10-26 NOTE — Transfer of Care (Signed)
Immediate Anesthesia Transfer of Care Note  Patient: Megan Montgomery  Procedure(s) Performed: PART MASTECTOMY,RADIO FREQUENCY LOCALIZER,AXILLARY SENTINEL NODE BIOPSY (Right: Breast)  Patient Location: PACU  Anesthesia Type:General  Level of Consciousness: sedated  Airway & Oxygen Therapy: Patient Spontanous Breathing and Patient connected to nasal cannula oxygen  Post-op Assessment: Report given to RN and Post -op Vital signs reviewed and stable  Post vital signs: Reviewed and stable  Last Vitals:  Vitals Value Taken Time  BP    Temp    Pulse 79 10/26/22 1107  Resp 12 10/26/22 1107  SpO2 100 % 10/26/22 1107    Last Pain:  Vitals:   10/26/22 0904  TempSrc: Temporal  PainSc: 0-No pain         Complications: No notable events documented.

## 2022-10-26 NOTE — Anesthesia Procedure Notes (Signed)
Procedure Name: LMA Insertion Date/Time: 10/26/2022 9:46 AM  Performed by: Ginger Carne, CRNAPre-anesthesia Checklist: Patient identified, Emergency Drugs available, Suction available, Patient being monitored and Timeout performed Patient Re-evaluated:Patient Re-evaluated prior to induction Oxygen Delivery Method: Circle system utilized Preoxygenation: Pre-oxygenation with 100% oxygen Induction Type: IV induction LMA: LMA inserted LMA Size: 4.0 Tube type: Oral Number of attempts: 1 Placement Confirmation: positive ETCO2 and breath sounds checked- equal and bilateral Dental Injury: Teeth and Oropharynx as per pre-operative assessment

## 2022-10-26 NOTE — Discharge Instructions (Addendum)
?  Diet: Resume home heart healthy regular diet.  ? ?Activity: No heavy lifting >20 pounds (children, pets, laundry, garbage) or strenuous activity until follow-up, but light activity and walking are encouraged. Do not drive or drink alcohol if taking narcotic pain medications. ? ?Wound care: May shower with soapy water and pat dry (do not rub incisions), but no baths or submerging incision underwater until follow-up. (no swimming)  ? ?Medications: Resume all home medications. For mild to moderate pain: acetaminophen (Tylenol) or ibuprofen (if no kidney disease). Combining Tylenol with alcohol can substantially increase your risk of causing liver disease. Narcotic pain medications, if prescribed, can be used for severe pain, though may cause nausea, constipation, and drowsiness. If you do not need the narcotic pain medication, you do not need to fill the prescription. ? ?Call office ([phone]) at any time if any questions, worsening pain, fevers/chills, bleeding, drainage from incision site, or other concerns. ? ? ?AMBULATORY SURGERY  ?DISCHARGE INSTRUCTIONS ? ? ?The drugs that you were given will stay in your system until tomorrow so for the next 24 hours you should not: ? ?Drive an automobile ?Make any legal decisions ?Drink any alcoholic beverage ? ? ?You may resume regular meals tomorrow.  Today it is better to start with liquids and gradually work up to solid foods. ? ?You may eat anything you prefer, but it is better to start with liquids, then soup and crackers, and gradually work up to solid foods. ? ? ?Please notify your doctor immediately if you have any unusual bleeding, trouble breathing, redness and pain at the surgery site, drainage, fever, or pain not relieved by medication. ? ? ? ?Additional Instructions: ? ? ? ? ? ? ? ?Please contact your physician with any problems or Same Day Surgery at [phone], Monday through Friday 6 am to 4 pm, or [place] [facility] [place] Main number at [phone].  ?

## 2022-10-26 NOTE — Anesthesia Preprocedure Evaluation (Signed)
Anesthesia Evaluation  Patient identified by MRN, date of birth, ID band Patient awake    Reviewed: Allergy & Precautions, H&P , NPO status , Patient's Chart, lab work & pertinent test results  History of Anesthesia Complications Negative for: history of anesthetic complications  Airway Mallampati: III  TM Distance: <3 FB Neck ROM: limited   Comment: Small chin Dental  (+) Teeth Intact, Implants, Dental Advidsory Given   Pulmonary neg shortness of breath, neg COPD, neg recent URI, former smoker   breath sounds clear to auscultation       Cardiovascular Exercise Tolerance: Good hypertension, On Medications and Pt. on medications (-) angina (-) Past MI, (-) Cardiac Stents and (-) CHF + dysrhythmias (h/o paroxysmal atrial tachycardia, described as occasional palpitations that resolved on their own. No AFib. 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, thoracic No date: GERD (gastroesophageal reflux disease) No date: Hypertension No date: PAT (paroxysmal atrial tachycardia) (HCC) No date: Skin cancer     Comment:  Neck  Past Surgical History: No date: ABDOMINAL HYSTERECTOMY No date: BREAST BIOPSY; Right     Comment:  neg bx/clip No date: CATARACT EXTRACTION W/ INTRAOCULAR LENS  IMPLANT, BILATERAL;  Bilateral No date: EYE SURGERY No date: OOPHORECTOMY; Right 2014: SHOULDER SURGERY; Left     Comment:  partial removal of AC joint d/t osteoarthritis No date: TONSILLECTOMY      Reproductive/Obstetrics negative OB ROS                             Anesthesia Physical Anesthesia Plan  ASA: 2  Anesthesia Plan: General   Post-op Pain Management: Ofirmev IV (intra-op)* and Tylenol PO (pre-op)*   Induction: Intravenous  PONV Risk Score and Plan: Ondansetron, Dexamethasone and Treatment may vary due to age or medical condition  Airway Management Planned: LMA  Additional Equipment:   Intra-op Plan:   Post-operative Plan: Extubation in OR  Informed Consent: I have reviewed the patients History and Physical, chart, labs and discussed the procedure including the risks, benefits and alternatives for the proposed anesthesia with the patient or authorized representative who has indicated his/her understanding and acceptance.     Dental Advisory Given  Plan Discussed with: Anesthesiologist, CRNA and Surgeon  Anesthesia Plan Comments:         Anesthesia Quick Evaluation

## 2022-10-27 ENCOUNTER — Encounter: Payer: Self-pay | Admitting: General Surgery

## 2022-10-28 ENCOUNTER — Other Ambulatory Visit: Payer: Self-pay | Admitting: Pathology

## 2022-10-28 LAB — SURGICAL PATHOLOGY

## 2022-10-31 ENCOUNTER — Encounter: Payer: Self-pay | Admitting: *Deleted

## 2022-10-31 NOTE — Progress Notes (Signed)
Oncotype order submitted online on path [id number]

## 2022-10-31 NOTE — Anesthesia Postprocedure Evaluation (Signed)
Anesthesia Post Note  Patient: Megan Montgomery  Procedure(s) Performed: PART MASTECTOMY,RADIO FREQUENCY LOCALIZER,AXILLARY SENTINEL NODE BIOPSY (Right: Breast)  Patient location during evaluation: PACU Anesthesia Type: General Level of consciousness: awake and alert Pain management: pain level controlled Vital Signs Assessment: post-procedure vital signs reviewed and stable Respiratory status: spontaneous breathing, nonlabored ventilation, respiratory function stable and patient connected to nasal cannula oxygen Cardiovascular status: blood pressure returned to baseline and stable Postop Assessment: no apparent nausea or vomiting Anesthetic complications: no   No notable events documented.   Last Vitals:  Vitals:   10/26/22 1215 10/26/22 1234  BP: (!) 161/63 (!) 168/68  Pulse: 75 82  Resp: 16 16  Temp: 36.4 C 36.6 C  SpO2: 100% 100%    Last Pain:  Vitals:   10/26/22 1234  TempSrc: Temporal  PainSc: 3                  Lenard Simmer

## 2022-11-02 ENCOUNTER — Telehealth: Payer: Self-pay | Admitting: Licensed Clinical Social Worker

## 2022-11-03 ENCOUNTER — Ambulatory Visit: Payer: Self-pay | Admitting: Licensed Clinical Social Worker

## 2022-11-03 ENCOUNTER — Encounter: Payer: Self-pay | Admitting: Licensed Clinical Social Worker

## 2022-11-03 DIAGNOSIS — Z1379 Encounter for other screening for genetic and chromosomal anomalies: Secondary | ICD-10-CM | POA: Insufficient documentation

## 2022-11-03 DIAGNOSIS — Z7189 Other specified counseling: Secondary | ICD-10-CM | POA: Insufficient documentation

## 2022-11-03 NOTE — Telephone Encounter (Signed)
I contacted [name] to discuss her genetic testing results. No pathogenic variants were identified in the 70 genes analyzed. Detailed clinic note to follow.   The test report has been scanned into EPIC and is located under the Molecular Pathology section of the Results Review tab.  A portion of the result report is included below for reference.      Megan Duverney, MS, [facility] Genetic [email or web address] [email or web address] Phone: [phone]

## 2022-11-03 NOTE — Progress Notes (Signed)
HPI:   Ms. Heward was previously seen in the [facility] due to a personal and family history of cancer and concerns regarding a hereditary predisposition to cancer. Please refer to our prior cancer genetics clinic note for more information regarding our discussion, assessment and recommendations, at the time. Ms. Stoermer recent genetic test results were disclosed to her, as were recommendations warranted by these results. These results and recommendations are discussed in more detail below.  CANCER HISTORY:  Oncology History  Breast cancer (HCC)  09/26/2022 Mammogram   Screening mammogram FINDINGS: In the right breast, a possible mass warrants further evaluation. In the left breast, no findings suspicious for malignancy.   IMPRESSION: Further evaluation is suggested for a possible mass in the right breast.  Diagnostic mammogram and ultrasound FINDINGS: Spot tomosynthesis views of the right breast demonstrate a 10 mm spiculated mass in the inner central right breast middle depth (spot CC image 29/43, spot MLO image 25/48). This corresponds with the mass seen on screening mammogram.   Targeted right breast ultrasound was performed. At 3:30 o'clock 5 cm from the nipple, there is a spiculated hypoechoic mass that measures 9 x 6 x 9 mm. There is no definite internal vascularity. This corresponds with the spiculated mass seen on mammogram.   Targeted right axillary ultrasound demonstrates multiple morphologically benign lymph nodes. No lymphadenopathy.   IMPRESSION: Right breast 9 mm spiculated mass in the 3:30 o'clock position is concerning for malignancy. Recommend further assessment with ultrasound-guided biopsy   10/11/2022 Pathology Results   DIAGNOSIS:  A. BREAST, RIGHT 3:30 5 CMFN; ULTRASOUND-GUIDED BIOPSY:  - INVASIVE MAMMARY CARCINOMA, NO SPECIAL TYPE.  Size of invasive carcinoma: 7 mm in this sample  Histologic grade of invasive carcinoma: Grade 1                        Glandular/tubular differentiation score: 2                       Nuclear pleomorphism score: 2                       Mitotic rate score: 1                       Total score: 5  Ductal carcinoma in situ: Present, intermediate grade  Lymphovascular invasion: Not identified   ADDENDUM:  CASE SUMMARY: BREAST BIOMARKER TESTS  Estrogen Receptor (ER) Status: POSITIVE          Percentage of cells with nuclear positivity: Greater than 90%          Average intensity of staining: Strong   Progesterone Receptor (PgR) Status: POSITIVE          Percentage of cells with nuclear positivity: 10-20%          Average intensity of staining: Moderate   HER2 (by immunohistochemistry): EQUIVOCAL (Score 2+)   CASE SUMMARY: BREAST BIOMARKER TESTS TEST(S) PERFORMED: HER2 (by in situ hybridization): NEGATIVE, Group 5 Number of observers: 2 Number of invasive tumor cells counted: 40 Dual probe assay    Average number of HER2 signals per cell: 2.0    Average number of CEP17 signals per cell: 1.7    HER2/CEP17 ratio: 1.18     10/11/2022 Cancer Staging   Staging form: Breast, AJCC 8th Edition - Clinical stage from 10/11/2022: Stage IA (cT1b, cN0, cM0, G1, ER+, PR+, HER2-) -  Signed by Michaelyn Barter, MD on 10/18/2022 Stage prefix: Initial diagnosis Histologic grading system: 3 grade system     FAMILY HISTORY:  We obtained a detailed, 4-generation family history.  Significant diagnoses are listed below: Family History  Problem Relation Age of Onset   Breast cancer Mother 17   Kidney cancer Mother 51   Lung cancer Father 65   Prostate cancer Father 58   Pancreatic cancer Cousin        dx 38s   Lymphoma Cousin    Ms. Ortman has 1 daughter, 31, 1 son, 15.    Ms. Rodenberg mother had breast cancer at 19 and kidney cancer at 8. Maternal cousin had lymphoma.    Ms. Buttrey father had lung cancer in his 27s-60s, prostate cancer in his 60s. A paternal cousin died of pancreatic cancer in his 41s.  Paternal grandfather possibly had lung cancer.   Ms. Bree is unaware of previous family history of genetic testing for hereditary cancer risks. There is no reported Ashkenazi Jewish ancestry. There is no known consanguinity.      GENETIC TEST RESULTS:  The [facility] Multi-Cancer+RNA Panel found no pathogenic mutations.   The Multi-Cancer + RNA Panel offered by [facility] includes sequencing and/or deletion/duplication analysis of the following 70 genes:  AIP*, ALK, APC*, ATM*, AXIN2*, BAP1*, BARD1*, BLM*, BMPR1A*, BRCA1*, BRCA2*, BRIP1*, CDC73*, CDH1*, CDK4, CDKN1B*, CDKN2A, CHEK2*, CTNNA1*, DICER1*, EPCAM, EGFR, FH*, FLCN*, GREM1, HOXB13, KIT, LZTR1, MAX*, MBD4, MEN1*, MET, MITF, MLH1*, MSH2*, MSH3*, MSH6*, MUTYH*, NF1*, NF2*, NTHL1*, PALB2*, PDGFRA, PMS2*, POLD1*, POLE*, POT1*, PRKAR1A*, PTCH1*, PTEN*, RAD51C*, RAD51D*, RB1*, RET, SDHA*, SDHAF2*, SDHB*, SDHC*, SDHD*, SMAD4*, SMARCA4*, SMARCB1*, SMARCE1*, STK11*, SUFU*, TMEM127*, TP53*, TSC1*, TSC2*, VHL*. RNA analysis is performed for * genes.   The test report has been scanned into EPIC and is located under the Molecular Pathology section of the Results Review tab.  A portion of the result report is included below for reference. Genetic testing reported out on 10/26/2022.     Even though a pathogenic variant was not identified, possible explanations for the cancer in the family may include: There may be no hereditary risk for cancer in the family. The cancers in Ms. Trim and/or her family may be sporadic/familial or due to other genetic and environmental factors. There may be a gene mutation in one of these genes that current testing methods cannot detect but that chance is small. There could be another gene that has not yet been discovered, or that we have not yet tested, that is responsible for the cancer diagnoses in the family.  It is also possible there is a hereditary cause for the cancer in the family that Ms. Hottenstein did not  inherit.  Therefore, it is important to remain in touch with cancer genetics in the future so that we can continue to offer Ms. Loewenstein the most up to date genetic testing.   ADDITIONAL GENETIC TESTING:  Ms. Corvera genetic testing was fairly extensive.  If there are additional relevant genes identified to increase cancer risk that can be analyzed in the future, we would be happy to discuss and coordinate this testing at that time.    CANCER SCREENING RECOMMENDATIONS:  Ms. Blust test result is considered negative (normal).  This means that we have not identified a hereditary cause for her personal and family history of cancer at this time.   An individual's cancer risk and medical management are not determined by genetic test results alone. Overall cancer risk assessment incorporates additional factors,  including personal medical history, family history, and any available genetic information that may result in a personalized plan for cancer prevention and surveillance. Therefore, it is recommended she continue to follow the cancer management and screening guidelines provided by her oncology and primary healthcare provider.  RECOMMENDATIONS FOR FAMILY MEMBERS:   Since she did not inherit a identifiable mutation in a cancer predisposition gene included on this panel, her children could not have inherited a known mutation from her in one of these genes. Individuals in this family might be at some increased risk of developing cancer, over the general population risk, due to the family history of cancer.  Individuals in the family should notify their providers of the family history of cancer. We recommend women in this family have a yearly mammogram beginning at age 61, or 64 years younger than the earliest onset of cancer, an annual clinical breast exam, and perform monthly breast self-exams.  Family members should have colonoscopies by at age 42, or earlier, as recommended by their providers. Other members  of the family may still carry a pathogenic variant in one of these genes that Ms. Berke did not inherit. Based on the family history, we recommend those related to her cousin with pancreatic cancer have genetic counseling and testing. Ms. Ocasio will let us know if we can be of any assistance in coordinating genetic counseling and/or testing for this family member.    FOLLOW-UP:  Lastly, we discussed with Ms. Molock that cancer genetics is a rapidly advancing field and it is possible that new genetic tests will be appropriate for her and/or her family members in the future. We encouraged her to remain in contact with cancer genetics on an annual basis so we can update her personal and family histories and let her know of advances in cancer genetics that may benefit this family.   Our contact number was provided. Ms. Nofziger questions were answered to her satisfaction, and she knows she is welcome to call us at anytime with additional questions or concerns.    Lacy Duverney, MS, [facility] Genetic Counselor [email or web address] Phone: [phone]

## 2022-11-08 ENCOUNTER — Encounter: Payer: Self-pay | Admitting: Internal Medicine

## 2022-11-14 ENCOUNTER — Encounter: Payer: Self-pay | Admitting: Internal Medicine

## 2022-11-17 ENCOUNTER — Inpatient Hospital Stay: Payer: Medicare Other | Attending: Internal Medicine | Admitting: Internal Medicine

## 2022-11-17 ENCOUNTER — Ambulatory Visit
Admission: RE | Admit: 2022-11-17 | Discharge: 2022-11-17 | Disposition: A | Discharge status: Home / Self Care | Payer: Medicare Other | Source: Ambulatory Visit | Attending: Radiation Oncology | Admitting: Radiation Oncology

## 2022-11-17 ENCOUNTER — Encounter: Payer: Self-pay | Admitting: Radiation Oncology

## 2022-11-17 VITALS — Temp 98.8°F | Resp 14 | Ht 67.0 in | Wt 173.0 lb

## 2022-11-17 VITALS — BP 154/69 | HR 73 | Temp 97.6°F | Wt 173.8 lb

## 2022-11-17 DIAGNOSIS — Z801 Family history of malignant neoplasm of trachea, bronchus and lung: Secondary | ICD-10-CM | POA: Diagnosis not present

## 2022-11-17 DIAGNOSIS — Z8049 Family history of malignant neoplasm of other genital organs: Secondary | ICD-10-CM | POA: Diagnosis not present

## 2022-11-17 DIAGNOSIS — C50811 Malignant neoplasm of overlapping sites of right female breast: Secondary | ICD-10-CM | POA: Diagnosis present

## 2022-11-17 DIAGNOSIS — Z170 Estrogen receptor positive status [ER+]: Secondary | ICD-10-CM | POA: Insufficient documentation

## 2022-11-17 DIAGNOSIS — Z8051 Family history of malignant neoplasm of kidney: Secondary | ICD-10-CM | POA: Insufficient documentation

## 2022-11-17 DIAGNOSIS — Z79899 Other long term (current) drug therapy: Secondary | ICD-10-CM | POA: Insufficient documentation

## 2022-11-17 DIAGNOSIS — C50411 Malignant neoplasm of upper-outer quadrant of right female breast: Secondary | ICD-10-CM | POA: Diagnosis not present

## 2022-11-17 DIAGNOSIS — Z803 Family history of malignant neoplasm of breast: Secondary | ICD-10-CM | POA: Diagnosis not present

## 2022-11-17 DIAGNOSIS — C50311 Malignant neoplasm of lower-inner quadrant of right female breast: Secondary | ICD-10-CM

## 2022-11-17 DIAGNOSIS — E785 Hyperlipidemia, unspecified: Secondary | ICD-10-CM | POA: Diagnosis not present

## 2022-11-17 NOTE — Consult Note (Signed)
NEW PATIENT EVALUATION  Name: Megan Montgomery  MRN: [id number]  Date:   11/17/2022     DOB: [birth year]   This 77 y.o. 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   DIAGNOSIS: The encounter diagnosis was Malignant neoplasm of lower-inner quadrant of right female breast, unspecified estrogen receptor status (HCC).   PREVIOUS INVESTIGATIONS:  Mammograms ultrasound reviewed Clinical notes reviewed Pathology report reviewed  HPI: Patient is a 77 year old female who presented with an abnormal mammogram of her right breast.  There was a 1 cm spiculated mass in the inner central right breast mid depth.  This was also seen on ultrasound at the 3:30 position 5 cm from nipple measuring 9 x 6 x 9 mm.  She underwent ultrasound-guided biopsy which was positive for invasive mammary carcinoma.  She went on to have a wide local excision and sentinel node biopsy for ER/PR positive HER2/neu not overexpressed invasive mammary carcinoma measuring 1 cm.  Tumor was overall grade 2.  Margins were clear at 6 mm.  1 sentinel lymph node was examined and negative for metastatic disease.  Postoperatively she has had some draining recently had reexcision and is now has gauze covering her lumpectomy scar.  She otherwise is without complaints.  She had Oncotype DX showing low risk for recurrence and will not receive systemic treatment.  PLANNED TREATMENT REGIMEN: Hypofractionated right whole breast radiation  PAST MEDICAL HISTORY:  has a past medical history of Breast cancer (HCC) (10/18/2022), Cervical cancer (HCC) (1988), Degenerative disc disease, thoracic, Dry eye syndrome, Family history of adverse reaction to anesthesia, GERD (gastroesophageal reflux disease), Glaucoma, Headache,  Hyperlipidemia, Hypertension, PAT (paroxysmal atrial tachycardia), and Skin cancer.    PAST SURGICAL HISTORY:  Past Surgical History:  Procedure Laterality Date   ABDOMINAL HYSTERECTOMY     BREAST BIOPSY Right    neg bx/clip   BREAST BIOPSY Right 10/11/2022   u/s bx, 3:30 RIBBON clip-path pendinf   BREAST BIOPSY Right 10/11/2022   [place] RT BREAST BX W LOC DEV 1ST LESION IMG BX SPEC US GUIDE 10/11/2022 [facility]   CATARACT EXTRACTION W/ INTRAOCULAR LENS  IMPLANT, BILATERAL Bilateral    COLONOSCOPY  2018   ESOPHAGOGASTRODUODENOSCOPY     KNEE ARTHROSCOPY WITH SUBCHONDROPLASTY Right 11/25/2019   Procedure: Right knee arthroscopic partial medial meniscectomy with subchondroplasty of the medial tibial plateau.;  Surgeon: Signa Kell, MD;  Location: [facility] OR[facility];  Service: Orthopedics;  Laterality: Right;   OOPHORECTOMY Right    PART MASTECTOMY,RADIO FREQUENCY LOCALIZER,AXILLARY SENTINEL NODE BIOPSY Right 10/26/2022   Procedure: PART MASTECTOMY,RADIO FREQUENCY LOCALIZER,AXILLARY SENTINEL NODE BIOPSY;  Surgeon: Carolan Shiver, MD;  Location: [facility] OR[facility];  Service: General;  Laterality: Right;   SHOULDER SURGERY Left 2014   partial removal of AC joint d/t osteoarthritis   TONSILLECTOMY     TOTAL KNEE ARTHROPLASTY Right 01/26/2022   Procedure: TOTAL KNEE ARTHROPLASTY - RNFA;  Surgeon: Christena Flake, MD;  Location: [facility] OR[facility];  Service: Orthopedics;  Laterality: Right;    FAMILY HISTORY: family history includes Breast cancer (age of onset: 42) in her mother; Kidney cancer (age of onset: 35) in her mother; Lung cancer (age of onset: 73) in her father; Lymphoma in her cousin; Pancreatic cancer in her cousin; Prostate cancer (age of onset: 13) in her father.  SOCIAL HISTORY:  reports that she quit smoking about 32 years ago. Her smoking use included cigarettes. She started smoking about 47 years ago. She has a 22.5 pack-year smoking history. She has never used smokeless tobacco. She reports current  alcohol use of about 7.0 standard drinks of alcohol per week. She reports that she does not use drugs.  ALLERGIES: Bee venom, Hydrocodone, Ivp dye [iodinated contrast media], Penicillins, and Septra [sulfamethoxazole-trimethoprim]  MEDICATIONS:  Current Outpatient Medications  Medication Sig Dispense Refill   acetaminophen (TYLENOL) 500 MG tablet Take 1-2 tablets (500-1,000 mg total) by mouth every 6 (six) hours as needed. (Patient taking differently: Take 1,000 mg by mouth 3 (three) times daily.) 30 tablet 0   amLODipine (NORVASC) 5 MG tablet Take 5 mg by mouth every morning.     calcium-vitamin D (OSCAL WITH D) 500-200 MG-UNIT TABS tablet Take 2 tablets by mouth daily. gummies     hydrochlorothiazide (HYDRODIURIL) 25 MG tablet Take 25 mg by mouth every morning.     latanoprost (XALATAN) 0.005 % ophthalmic solution Place 1 drop into the right eye at bedtime.     lisinopril (PRINIVIL,ZESTRIL) 20 MG tablet Take 20 mg by mouth 2 (two) times daily.     Melatonin 5 MG TABS Take 5 mg by mouth at bedtime.      meloxicam (MOBIC) 7.5 MG tablet Take 7.5 mg by mouth 2 (two) times daily.     Multiple Vitamins-Minerals (CENTRUM SILVER 50+WOMEN) TABS Take 1 tablet by mouth daily.     omeprazole (PRILOSEC) 40 MG capsule Take 40 mg by mouth every morning.     ondansetron (ZOFRAN) 4 MG tablet Take 1 tablet (4 mg total) by mouth every 6 (six) hours as needed for nausea. 30 tablet 0   oxyCODONE (OXY IR/ROXICODONE) 5 MG immediate release tablet Take 1-2 tablets (5-10 mg total) by mouth every 4 (four) hours as needed for severe pain. 40 tablet 0   Propylene Glycol (SYSTANE COMPLETE) 0.6 % SOLN Place 1 drop into both eyes 3 (three) times daily.     simvastatin (ZOCOR) 10 MG tablet Take 10 mg by mouth at bedtime.      traMADol (ULTRAM) 50 MG tablet Take 1 tablet (50 mg total) by mouth every 6 (six) hours as needed. 20 tablet 0   No current facility-administered medications for this encounter.    ECOG PERFORMANCE  STATUS:  0 - Asymptomatic  REVIEW OF SYSTEMS: Patient denies any weight loss, fatigue, weakness, fever, chills or night sweats. Patient denies any loss of vision, blurred vision. Patient denies any ringing  of the ears or hearing loss. No irregular heartbeat. Patient denies heart murmur or history of fainting. Patient denies any chest pain or pain radiating to her upper extremities. Patient denies any shortness of breath, difficulty breathing at night, cough or hemoptysis. Patient denies any swelling in the lower legs. Patient denies any nausea vomiting, vomiting of blood, or coffee ground material in the vomitus. Patient denies any stomach pain. Patient states has had normal bowel movements no significant constipation or diarrhea. Patient denies any dysuria, hematuria or significant nocturia. Patient denies any problems walking, swelling in the joints or loss of balance. Patient denies any skin changes, loss of hair or loss of weight. Patient denies any excessive worrying or anxiety or significant depression. Patient denies any problems with insomnia. Patient denies excessive thirst, polyuria, polydipsia. Patient denies any swollen glands, patient denies easy bruising or easy bleeding. Patient denies any recent infections, allergies or URI. Patient "s visual  fields have not changed significantly in recent time.   PHYSICAL EXAM: Temp 98.8 F (37.1 C)   Resp 14   Ht 5\' 7"  (1.702 m)   Wt 173 lb (78.5 kg)   BMI 27.10 kg/m  Patient has a bandaged incision where she recently had reexcision for drainage.  No dominant masses noted in either breast no axillary or supraclavicular adenopathy is identified.  Well-developed well-nourished patient in NAD. HEENT reveals PERLA, EOMI, discs not visualized.  Oral cavity is clear. No oral mucosal lesions are identified. Neck is clear without evidence of cervical or supraclavicular adenopathy. Lungs are clear to A&P. Cardiac examination is essentially unremarkable with  regular rate and rhythm without murmur rub or thrill. Abdomen is benign with no organomegaly or masses noted. Motor sensory and DTR levels are equal and symmetric in the upper and lower extremities. Cranial nerves II through XII are grossly intact. Proprioception is intact. No peripheral adenopathy or edema is identified. No motor or sensory levels are noted. Crude visual fields are within normal range.  LABORATORY DATA: Pathology report reviewed    RADIOLOGY RESULTS: Mammogram and ultrasound reviewed compatible with above-stated findings   IMPRESSION: Stage Ia ER/PR positive invasive mammary carcinoma of the right breast as post wide local excision and sentinel node biopsy in 77 year old female  PLAN: At this time I will allow 2 more weeks for healing I have set up a CT simulation at that time.  I would treat with hypofractionated course of whole breast radiation over 3 weeks boosting her scar another 1000 centigrade using photon beam.  Risks and benefits of treatment including skin reaction fatigue alteration of blood counts possible inclusion of superficial lung all were discussed in detail with the patient.  She seems to comprehend my treatment plan well.  Patient also be candidate for endocrine therapy after completion of radiation.  I would like to take this opportunity to thank you for allowing me to participate in the care of your patient.Carmina Miller, MD

## 2022-11-17 NOTE — Progress Notes (Signed)
[facility] CONSULT NOTE  Patient Care Team: Zada Finders Joycie Peek, MD as PCP - General (Family Medicine) Hulen Luster, RN as Oncology Nurse Navigator  REFERRING PROVIDER: Dr. Zada Finders  REASON FOR REFFERAL: Right breast cancer  CANCER STAGING   Cancer Staging  Breast cancer [facility]) Staging form: Breast, AJCC 8th Edition - Clinical stage from 10/11/2022: Stage IA (cT1b, cN0, cM0, G1, ER+, PR+, HER2-) - Signed by Michaelyn Barter, MD on 11/17/2022 Stage prefix: Initial diagnosis Histologic grading system: 3 grade system - Pathologic stage from 10/26/2022: Stage IA (pT1b, pN0(sn), cM0, G2, ER+, PR+, HER2-, Oncotype DX score: 16) - Signed by Michaelyn Barter, MD on 11/17/2022 Stage prefix: Initial diagnosis Method of lymph node assessment: Sentinel lymph node biopsy Multigene prognostic tests performed: Oncotype DX Recurrence score range: Greater than or equal to 11 Histologic grading system: 3 grade system   ASSESSMENT & PLAN:  Megan Montgomery 77 y.o. 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  # Right breast IDC, Stage IA ER/PR+, HER2- -detected on a screening mammogram. Diagnostic mammogram and ultrasound from 09/30/2022 showed 9 x 6 x 9 mm spiculated hypoechoic mass at 330 o'clock position 5 cm from the nipple.  No suspicious lymph nodes.  Status post biopsy showed IDC, 7 mm in size, overall grade 1, LVI negative, ER more than 90%, PR 10 to 20%, HER2 equivocal by IHC and negative by FISH.  -s/p right breast lumpectomy with SLNB by Dr. Maia Plan. Pathology showed IDC, with component of DCIS, 0/1 lymph node negative for malignancy.  Tumor measuring 10 mm, overall grade 2, LVI negative, margins negative.  Oncotype DX score was 16 so there is no benefit of adjuvant chemotherapy.  He was seen by Drs. Crystal today and is  scheduled for CT simulation on June 29.  She had developed hematoma in the right breast which was drained and has a wound which is healing.  I plan to see her back in September after completion of radiation to start aromatase inhibitor.  I discussed with her about letrozole 2.5 mg once daily for 5 years.  Side effects such as mood changes, myalgia, arthralgia, osteoporosis, hyperlipidemia was discussed.  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  Has previously taken Boniva.  She is taking calcium vitamin D Gummies.  I will follow-up with her 2 to 3 weeks after surgery to discuss path report.  # Family history of breast cancer -She has family history of breast cancer and kidney cancer in mother.  Father lung and prostate cancer. -Seen genetics.  Testing result pending.  # History of cervical cancer -Status post surgery in 1988  # Hyperlipidemia -Simvastatin  RTC in about 8 weeks for MD visit  No orders of the defined types were placed in this encounter.   The total time spent in the appointment was 30 minutes encounter with patients including review of chart and various tests results, discussions about plan of care and coordination of care plan   All questions were answered. The patient knows to call the clinic with any problems, questions or concerns. No barriers to learning was detected.  Michaelyn Barter, MD 7/11/20243:55 PM   HISTORY OF PRESENTING ILLNESS:  Megan Montgomery 77 y.o. 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  Interval history Patient was seen today as follow-up postsurgery to discuss further treatment option. Her lumpectomy was complicated by hematoma formation which has been drained.  She has a wound and she has been packing it.  Otherwise she is  feeling well.  I have reviewed her chart and materials related to her cancer extensively and collaborated history with the patient. Summary of oncologic history is as follows: Oncology History  Breast cancer (HCC)  09/26/2022 Mammogram   Screening mammogram FINDINGS: In the right breast, a possible mass warrants further evaluation. In the left breast, no findings suspicious for malignancy.   IMPRESSION: Further evaluation is suggested for a possible mass in the right breast.  Diagnostic mammogram and ultrasound FINDINGS: Spot tomosynthesis views of the right breast demonstrate a 10 mm spiculated mass in the inner central right breast middle depth (spot CC image 29/43, spot MLO image 25/48). This corresponds with the mass seen on screening mammogram.   Targeted right breast ultrasound was performed. At 3:30 o'clock 5 cm from the nipple, there is a spiculated hypoechoic mass that measures 9 x 6 x 9 mm. There is no definite internal vascularity. This corresponds with the spiculated mass seen on mammogram.   Targeted right axillary ultrasound demonstrates multiple morphologically benign lymph nodes. No lymphadenopathy.   IMPRESSION: Right breast 9 mm spiculated mass in the 3:30 o'clock position is concerning for malignancy. Recommend further assessment with ultrasound-guided biopsy   10/11/2022 Pathology Results   DIAGNOSIS:  A. BREAST, RIGHT 3:30 5 CMFN; ULTRASOUND-GUIDED BIOPSY:  - INVASIVE MAMMARY CARCINOMA, NO SPECIAL TYPE.  Size of invasive carcinoma: 7 mm in this sample  Histologic grade of invasive carcinoma: Grade 1                       Glandular/tubular differentiation score: 2                       Nuclear pleomorphism score: 2                       Mitotic rate score: 1                       Total score: 5  Ductal carcinoma in situ: Present, intermediate grade  Lymphovascular invasion: Not identified   ADDENDUM:  CASE SUMMARY: BREAST BIOMARKER TESTS  Estrogen  Receptor (ER) Status: POSITIVE          Percentage of cells with nuclear positivity: Greater than 90%          Average intensity of staining: Strong   Progesterone Receptor (PgR) Status: POSITIVE          Percentage of cells with nuclear positivity: 10-20%          Average intensity of staining: Moderate   HER2 (by immunohistochemistry): EQUIVOCAL (Score 2+)   CASE SUMMARY: BREAST BIOMARKER TESTS TEST(S) PERFORMED: HER2 (by in situ hybridization): NEGATIVE, Group 5 Number of observers: 2 Number of invasive tumor cells counted: 40 Dual probe assay    Average number of HER2 signals per cell: 2.0    Average number of CEP17 signals per cell: 1.7    HER2/CEP17 ratio: 1.18     10/11/2022 Cancer Staging   Staging form: Breast, AJCC 8th Edition - Clinical stage from 10/11/2022: Stage IA (cT1b, cN0, cM0, G1,  ER+, PR+, HER2-) - Signed by Michaelyn Barter, MD on 11/17/2022 Stage prefix: Initial diagnosis Histologic grading system: 3 grade system   10/26/2022 Cancer Staging   Staging form: Breast, AJCC 8th Edition - Pathologic stage from 10/26/2022: Stage IA (pT1b, pN0(sn), cM0, G2, ER+, PR+, HER2-, Oncotype DX score: 16) - Signed by Michaelyn Barter, MD on 11/17/2022 Stage prefix: Initial diagnosis Method of lymph node assessment: Sentinel lymph node biopsy Multigene prognostic tests performed: Oncotype DX Recurrence score range: Greater than or equal to 11 Histologic grading system: 3 grade system    Menarche age 71 Children 2 Age at first birth 59 Birth control yes OCP more than 5 years Hysterectomy 1988 took 1 ovary out for cervical cancer HRT yes for about a year History of breast biopsies yes 25 years ago a cyst which was aspirated  Family history- Mother-breast cancer and kidney cancer Father-lung and prostate cancer Maternal cousin-lymphoma  MEDICAL HISTORY:  Past Medical History:  Diagnosis Date   Breast cancer (HCC) 10/18/2022   Cervical cancer (HCC) 1988   Degenerative disc  disease, thoracic    Dry eye syndrome    Family history of adverse reaction to anesthesia    dad-hard to intubate   GERD (gastroesophageal reflux disease)    Glaucoma    Headache    h/o migraines   Hyperlipidemia    Hypertension    PAT (paroxysmal atrial tachycardia)    Skin cancer    Neck    SURGICAL HISTORY: Past Surgical History:  Procedure Laterality Date   ABDOMINAL HYSTERECTOMY     BREAST BIOPSY Right    neg bx/clip   BREAST BIOPSY Right 10/11/2022   u/s bx, 3:30 RIBBON clip-path pendinf   BREAST BIOPSY Right 10/11/2022   [place] RT BREAST BX W LOC DEV 1ST LESION IMG BX SPEC US GUIDE 10/11/2022 [facility]   CATARACT EXTRACTION W/ INTRAOCULAR LENS  IMPLANT, BILATERAL Bilateral    COLONOSCOPY  2018   ESOPHAGOGASTRODUODENOSCOPY     KNEE ARTHROSCOPY WITH SUBCHONDROPLASTY Right 11/25/2019   Procedure: Right knee arthroscopic partial medial meniscectomy with subchondroplasty of the medial tibial plateau.;  Surgeon: Signa Kell, MD;  Location: [facility] ORS;  Service: Orthopedics;  Laterality: Right;   OOPHORECTOMY Right    PART MASTECTOMY,RADIO FREQUENCY LOCALIZER,AXILLARY SENTINEL NODE BIOPSY Right 10/26/2022   Procedure: PART MASTECTOMY,RADIO FREQUENCY LOCALIZER,AXILLARY SENTINEL NODE BIOPSY;  Surgeon: Carolan Shiver, MD;  Location: [facility] ORS;  Service: General;  Laterality: Right;   SHOULDER SURGERY Left 2014   partial removal of AC joint d/t osteoarthritis   TONSILLECTOMY     TOTAL KNEE ARTHROPLASTY Right 01/26/2022   Procedure: TOTAL KNEE ARTHROPLASTY - RNFA;  Surgeon: Christena Flake, MD;  Location: [facility] ORS;  Service: Orthopedics;  Laterality: Right;    SOCIAL HISTORY: Social History   Socioeconomic History   Marital status: Divorced    Spouse name: Not on file   Number of children: Not on file   Years of education: Not on file   Highest education level: Not on file  Occupational History   Not on file  Tobacco Use   Smoking status: Former    Current packs/day:  0.00    Average packs/day: 1.5 packs/day for 15.0 years (22.5 ttl pk-yrs)    Types: Cigarettes    Start date: 54    Quit date: 35    Years since quitting: 32.5   Smokeless tobacco: Never  Vaping Use   Vaping status: Never Used  Substance and Sexual Activity   Alcohol use:  Yes    Alcohol/week: 7.0 standard drinks of alcohol    Types: 7 Shots of liquor per week    Comment: nightly   Drug use: No   Sexual activity: Not on file  Other Topics Concern   Not on file  Social History Narrative   Not on file   Social Determinants of Health   Financial Resource Strain: Low Risk  (12/14/2021)   Received from [facility], [facility]   Overall Financial Resource Strain (CARDIA)    Difficulty of Paying Living Expenses: Not hard at all  Food Insecurity: Patient Declined (01/26/2022)   Hunger Vital Sign    Worried About Running Out of Food in the Last Year: Patient declined    Ran Out of Food in the Last Year: Patient declined  Transportation Needs: No Transportation Needs (01/26/2022)   PRAPARE - Administrator, Civil Service (Medical): No    Lack of Transportation (Non-Medical): No  Physical Activity: Not on file  Stress: Not on file  Social Connections: Not on file  Intimate Partner Violence: Not At Risk (01/26/2022)   Humiliation, Afraid, Rape, and Kick questionnaire    Fear of Current or Ex-Partner: No    Emotionally Abused: No    Physically Abused: No    Sexually Abused: No    FAMILY HISTORY: Family History  Problem Relation Age of Onset   Breast cancer Mother 46   Kidney cancer Mother 45   Lung cancer Father 11   Prostate cancer Father 69   Pancreatic cancer Cousin        dx 5s   Lymphoma Cousin     ALLERGIES:  is allergic to bee venom, hydrocodone, ivp dye [iodinated contrast media], penicillins, and septra [sulfamethoxazole-trimethoprim].  MEDICATIONS:  Current Outpatient Medications  Medication Sig Dispense Refill    acetaminophen (TYLENOL) 500 MG tablet Take 1-2 tablets (500-1,000 mg total) by mouth every 6 (six) hours as needed. (Patient taking differently: Take 1,000 mg by mouth 3 (three) times daily.) 30 tablet 0   amLODipine (NORVASC) 5 MG tablet Take 5 mg by mouth every morning.     calcium-vitamin D (OSCAL WITH D) 500-200 MG-UNIT TABS tablet Take 2 tablets by mouth daily. gummies     hydrochlorothiazide (HYDRODIURIL) 25 MG tablet Take 25 mg by mouth every morning.     latanoprost (XALATAN) 0.005 % ophthalmic solution Place 1 drop into the right eye at bedtime.     lisinopril (PRINIVIL,ZESTRIL) 20 MG tablet Take 20 mg by mouth 2 (two) times daily.     Melatonin 5 MG TABS Take 5 mg by mouth at bedtime.      meloxicam (MOBIC) 7.5 MG tablet Take 7.5 mg by mouth 2 (two) times daily.     Multiple Vitamins-Minerals (CENTRUM SILVER 50+WOMEN) TABS Take 1 tablet by mouth daily.     omeprazole (PRILOSEC) 40 MG capsule Take 40 mg by mouth every morning.     ondansetron (ZOFRAN) 4 MG tablet Take 1 tablet (4 mg total) by mouth every 6 (six) hours as needed for nausea. 30 tablet 0   oxyCODONE (OXY IR/ROXICODONE) 5 MG immediate release tablet Take 1-2 tablets (5-10 mg total) by mouth every 4 (four) hours as needed for severe pain. 40 tablet 0   Propylene Glycol (SYSTANE COMPLETE) 0.6 % SOLN Place 1 drop into both eyes 3 (three) times daily.     simvastatin (ZOCOR) 10 MG tablet Take 10 mg by mouth at bedtime.  traMADol (ULTRAM) 50 MG tablet Take 1 tablet (50 mg total) by mouth every 6 (six) hours as needed. 20 tablet 0   No current facility-administered medications for this visit.    REVIEW OF SYSTEMS:   Pertinent information mentioned in HPI All other systems were reviewed with the patient and are negative.  PHYSICAL EXAMINATION: ECOG PERFORMANCE STATUS: 0 - Asymptomatic  Vitals:   11/17/22 1503  BP: (!) 154/69  Pulse: 73  Temp: 97.6 F (36.4 C)  SpO2: 100%   Filed Weights   11/17/22 1503   Weight: 173 lb 12.8 oz (78.8 kg)    GENERAL:alert, no distress and comfortable SKIN: skin color, texture, turgor are normal, no rashes or significant lesions EYES: normal, conjunctiva are pink and non-injected, sclera clear OROPHARYNX:no exudate, no erythema and lips, buccal mucosa, and tongue normal  NECK: supple, thyroid normal size, non-tender, without nodularity LYMPH:  no palpable lymphadenopathy in the cervical, axillary or inguinal LUNGS: clear to auscultation and percussion with normal breathing effort HEART: regular rate & rhythm and no murmurs and no lower extremity edema ABDOMEN:abdomen soft, non-tender and normal bowel sounds Musculoskeletal:no cyanosis of digits and no clubbing  PSYCH: alert & oriented x 3 with fluent speech NEURO: no focal motor/sensory deficits  LABORATORY DATA:  I have reviewed the data as listed Lab Results  Component Value Date   WBC 7.8 10/24/2022   HGB 11.5 (L) 10/24/2022   HCT 33.3 (L) 10/24/2022   MCV 90.0 10/24/2022   PLT 411 (H) 10/24/2022   Recent Labs    01/19/22 1103 01/27/22 0419  NA 131* 128*  K 3.4* 4.1  CL 94* 92*  CO2 28 27  GLUCOSE 101* 140*  BUN 18 25*  CREATININE 0.72 0.76  CALCIUM 9.2 9.0  GFRNONAA >60 >60  PROT 8.0  --   ALBUMIN 4.3  --   AST 22  --   ALT 16  --   ALKPHOS 73  --   BILITOT 0.8  --     RADIOGRAPHIC STUDIES: I have personally reviewed the radiological images as listed and agreed with the findings in the report. MM Breast Surgical Specimen  Result Date: 10/26/2022 CLINICAL DATA:  Status post Savi Scout localized RIGHT breast lumpectomy. Patient is status post ultrasound-guided biopsy of a RIGHT breast mass which demonstrated invasive mammary carcinoma (RIBBON clip, displaced). SAVI was placed within the mass. EXAM: SPECIMEN RADIOGRAPH OF THE RIGHT BREAST COMPARISON:  Previous exam(s). FINDINGS: Status post excision of the RIGHT breast. The [facility] reflector and RIBBON shaped clip are present  within the specimen. IMPRESSION: Specimen radiograph of the RIGHT breast. Electronically Signed   By: Meda Klinefelter M.D.   On: 10/26/2022 15:50  NM Sentinel Node Inj-No Rpt (Breast)  Result Date: 10/26/2022 Sulfur Colloid was injected by the Nuclear Medicine Technologist for sentinel lymph node localization.   MM 3D DIAGNOSTIC MAMMOGRAM UNILATERAL RIGHT BREAST  Result Date: 10/24/2022 CLINICAL DATA:  Post SAVI tag placement, prior to right breast lumpectomy. EXAM: DIGITAL DIAGNOSTIC UNILATERAL RIGHT MAMMOGRAM WITH TOMOSYNTHESIS TECHNIQUE: Right digital diagnostic mammography and breast tomosynthesis was performed. COMPARISON:  Previous exam(s). ACR Breast Density Category c: The breasts are heterogeneously dense, which may obscure small masses. FINDINGS: The SAVI tag is located within the mass in the right breast lower inner quadrant. No significant hematoma seen. IMPRESSION: Successful placement of preoperative SAVI tag localizer. Electronically Signed   By: Ted Mcalpine M.D.   On: 10/24/2022 16:44  [place] RT RADIO FREQUENCY TAG LOC US  GUIDE  Result Date: 10/24/2022 CLINICAL DATA:  Preoperative SAVI tag placement. EXAM: NEEDLE LOCALIZATION OF THE RIGHT BREAST WITH ULTRASOUND GUIDANCE COMPARISON:  Previous exam(s). FINDINGS: Patient presents for needle localization prior to right breast lumpectomy. I met with the patient and we discussed the procedure of needle localization including benefits and alternatives. We discussed the high likelihood of a successful procedure. We discussed the risks of the procedure, including infection, bleeding, tissue injury, and further surgery. Informed, written consent was given. The usual time-out protocol was performed immediately prior to the procedure. Using ultrasound guidance, sterile technique, 1% lidocaine and a 7.5 cm SAVI tag introducer, the right breast 3:30 o'clock mass was localized using inferior approach. The images were marked for Dr. Hazle Quant.  IMPRESSION: SAVI tag localization of the right breast. No apparent complications. Electronically Signed   By: Ted Mcalpine M.D.   On: 10/24/2022 16:32

## 2022-11-17 NOTE — Progress Notes (Signed)
Patient is having to pack her wound from her surgery and she doesn't really understand what to use or how to change the dressing.

## 2022-12-05 ENCOUNTER — Encounter: Payer: Self-pay | Admitting: *Deleted

## 2022-12-05 ENCOUNTER — Ambulatory Visit
Admission: RE | Admit: 2022-12-05 | Discharge: 2022-12-05 | Disposition: A | Discharge status: Home / Self Care | Payer: Medicare Other | Source: Ambulatory Visit | Attending: Radiation Oncology | Admitting: Radiation Oncology

## 2022-12-05 DIAGNOSIS — Z170 Estrogen receptor positive status [ER+]: Secondary | ICD-10-CM | POA: Diagnosis not present

## 2022-12-05 DIAGNOSIS — C50311 Malignant neoplasm of lower-inner quadrant of right female breast: Secondary | ICD-10-CM | POA: Diagnosis not present

## 2022-12-06 DIAGNOSIS — C50311 Malignant neoplasm of lower-inner quadrant of right female breast: Secondary | ICD-10-CM | POA: Diagnosis not present

## 2022-12-09 ENCOUNTER — Other Ambulatory Visit: Payer: Self-pay | Admitting: *Deleted

## 2022-12-09 DIAGNOSIS — C50311 Malignant neoplasm of lower-inner quadrant of right female breast: Secondary | ICD-10-CM

## 2022-12-12 ENCOUNTER — Ambulatory Visit: Payer: Medicare Other

## 2022-12-13 ENCOUNTER — Ambulatory Visit: Payer: Medicare Other

## 2022-12-14 ENCOUNTER — Ambulatory Visit: Payer: Medicare Other

## 2022-12-14 ENCOUNTER — Ambulatory Visit
Admission: RE | Admit: 2022-12-14 | Discharge: 2022-12-14 | Disposition: A | Discharge status: Home / Self Care | Payer: Medicare Other | Source: Ambulatory Visit | Attending: Radiation Oncology | Admitting: Radiation Oncology

## 2022-12-14 DIAGNOSIS — Z170 Estrogen receptor positive status [ER+]: Secondary | ICD-10-CM | POA: Insufficient documentation

## 2022-12-14 DIAGNOSIS — C50311 Malignant neoplasm of lower-inner quadrant of right female breast: Secondary | ICD-10-CM | POA: Insufficient documentation

## 2022-12-15 ENCOUNTER — Other Ambulatory Visit: Payer: Self-pay

## 2022-12-15 ENCOUNTER — Ambulatory Visit
Admission: RE | Admit: 2022-12-15 | Discharge: 2022-12-15 | Disposition: A | Discharge status: Home / Self Care | Payer: Medicare Other | Source: Ambulatory Visit | Attending: Radiation Oncology | Admitting: Radiation Oncology

## 2022-12-15 DIAGNOSIS — C50311 Malignant neoplasm of lower-inner quadrant of right female breast: Secondary | ICD-10-CM | POA: Diagnosis not present

## 2022-12-15 LAB — RAD ONC ARIA SESSION SUMMARY
Course Elapsed Days: 0
Plan Fractions Treated to Date: 1
Plan Prescribed Dose Per Fraction: 2.66 Gy
Plan Total Fractions Prescribed: 16
Plan Total Prescribed Dose: 42.56 Gy
Reference Point Dosage Given to Date: 2.66 Gy
Reference Point Session Dosage Given: 2.66 Gy
Session Number: 1

## 2022-12-16 ENCOUNTER — Other Ambulatory Visit: Payer: Self-pay

## 2022-12-16 ENCOUNTER — Ambulatory Visit
Admission: RE | Admit: 2022-12-16 | Discharge: 2022-12-16 | Disposition: A | Discharge status: Home / Self Care | Payer: Medicare Other | Source: Ambulatory Visit | Attending: Radiation Oncology | Admitting: Radiation Oncology

## 2022-12-16 DIAGNOSIS — C50311 Malignant neoplasm of lower-inner quadrant of right female breast: Secondary | ICD-10-CM | POA: Diagnosis not present

## 2022-12-16 LAB — RAD ONC ARIA SESSION SUMMARY
Course Elapsed Days: 1
Plan Fractions Treated to Date: 2
Plan Prescribed Dose Per Fraction: 2.66 Gy
Plan Total Fractions Prescribed: 16
Plan Total Prescribed Dose: 42.56 Gy
Reference Point Dosage Given to Date: 5.32 Gy
Reference Point Session Dosage Given: 2.66 Gy
Session Number: 2

## 2022-12-19 ENCOUNTER — Other Ambulatory Visit: Payer: Self-pay

## 2022-12-19 ENCOUNTER — Ambulatory Visit
Admission: RE | Admit: 2022-12-19 | Discharge: 2022-12-19 | Disposition: A | Discharge status: Home / Self Care | Payer: Medicare Other | Source: Ambulatory Visit | Attending: Radiation Oncology | Admitting: Radiation Oncology

## 2022-12-19 DIAGNOSIS — C50311 Malignant neoplasm of lower-inner quadrant of right female breast: Secondary | ICD-10-CM | POA: Diagnosis not present

## 2022-12-19 LAB — RAD ONC ARIA SESSION SUMMARY
Course Elapsed Days: 4
Plan Fractions Treated to Date: 3
Plan Prescribed Dose Per Fraction: 2.66 Gy
Plan Total Fractions Prescribed: 16
Plan Total Prescribed Dose: 42.56 Gy
Reference Point Dosage Given to Date: 7.98 Gy
Reference Point Session Dosage Given: 2.66 Gy
Session Number: 3

## 2022-12-20 ENCOUNTER — Other Ambulatory Visit: Payer: Self-pay

## 2022-12-20 ENCOUNTER — Ambulatory Visit
Admission: RE | Admit: 2022-12-20 | Discharge: 2022-12-20 | Disposition: A | Discharge status: Home / Self Care | Payer: Medicare Other | Source: Ambulatory Visit | Attending: Radiation Oncology | Admitting: Radiation Oncology

## 2022-12-20 DIAGNOSIS — C50311 Malignant neoplasm of lower-inner quadrant of right female breast: Secondary | ICD-10-CM | POA: Diagnosis not present

## 2022-12-20 LAB — RAD ONC ARIA SESSION SUMMARY
Course Elapsed Days: 5
Plan Fractions Treated to Date: 4
Plan Prescribed Dose Per Fraction: 2.66 Gy
Plan Total Fractions Prescribed: 16
Plan Total Prescribed Dose: 42.56 Gy
Reference Point Dosage Given to Date: 10.64 Gy
Reference Point Session Dosage Given: 2.66 Gy
Session Number: 4

## 2022-12-21 ENCOUNTER — Other Ambulatory Visit: Payer: Self-pay

## 2022-12-21 ENCOUNTER — Ambulatory Visit
Admission: RE | Admit: 2022-12-21 | Discharge: 2022-12-21 | Disposition: A | Discharge status: Home / Self Care | Payer: Medicare Other | Source: Ambulatory Visit | Attending: Radiation Oncology | Admitting: Radiation Oncology

## 2022-12-21 ENCOUNTER — Inpatient Hospital Stay: Payer: Medicare Other

## 2022-12-21 DIAGNOSIS — C50311 Malignant neoplasm of lower-inner quadrant of right female breast: Secondary | ICD-10-CM

## 2022-12-21 DIAGNOSIS — Z170 Estrogen receptor positive status [ER+]: Secondary | ICD-10-CM | POA: Insufficient documentation

## 2022-12-21 DIAGNOSIS — C50811 Malignant neoplasm of overlapping sites of right female breast: Secondary | ICD-10-CM | POA: Insufficient documentation

## 2022-12-21 LAB — CBC (CANCER CENTER ONLY)
HCT: 34.2 % — ABNORMAL LOW (ref 36.0–46.0)
Hemoglobin: 11.4 g/dL — ABNORMAL LOW (ref 12.0–15.0)
MCH: 30.9 pg (ref 26.0–34.0)
MCHC: 33.3 g/dL (ref 30.0–36.0)
MCV: 92.7 fL (ref 80.0–100.0)
Platelet Count: 376 10*3/uL (ref 150–400)
RBC: 3.69 MIL/uL — ABNORMAL LOW (ref 3.87–5.11)
RDW: 13.2 % (ref 11.5–15.5)
WBC Count: 6.9 10*3/uL (ref 4.0–10.5)
nRBC: 0 % (ref 0.0–0.2)

## 2022-12-21 LAB — RAD ONC ARIA SESSION SUMMARY
Course Elapsed Days: 6
Plan Fractions Treated to Date: 5
Plan Prescribed Dose Per Fraction: 2.66 Gy
Plan Total Fractions Prescribed: 16
Plan Total Prescribed Dose: 42.56 Gy
Reference Point Dosage Given to Date: 13.3 Gy
Reference Point Session Dosage Given: 2.66 Gy
Session Number: 5

## 2022-12-22 ENCOUNTER — Other Ambulatory Visit: Payer: Self-pay

## 2022-12-22 ENCOUNTER — Ambulatory Visit
Admission: RE | Admit: 2022-12-22 | Discharge: 2022-12-22 | Disposition: A | Discharge status: Home / Self Care | Payer: Medicare Other | Source: Ambulatory Visit | Attending: Radiation Oncology | Admitting: Radiation Oncology

## 2022-12-22 DIAGNOSIS — C50311 Malignant neoplasm of lower-inner quadrant of right female breast: Secondary | ICD-10-CM | POA: Diagnosis not present

## 2022-12-22 LAB — RAD ONC ARIA SESSION SUMMARY
Course Elapsed Days: 7
Plan Fractions Treated to Date: 6
Plan Prescribed Dose Per Fraction: 2.66 Gy
Plan Total Fractions Prescribed: 16
Plan Total Prescribed Dose: 42.56 Gy
Reference Point Dosage Given to Date: 15.96 Gy
Reference Point Session Dosage Given: 2.66 Gy
Session Number: 6

## 2022-12-23 ENCOUNTER — Other Ambulatory Visit: Payer: Self-pay

## 2022-12-23 ENCOUNTER — Ambulatory Visit
Admission: RE | Admit: 2022-12-23 | Discharge: 2022-12-23 | Disposition: A | Discharge status: Home / Self Care | Payer: Medicare Other | Source: Ambulatory Visit | Attending: Radiation Oncology | Admitting: Radiation Oncology

## 2022-12-23 DIAGNOSIS — C50311 Malignant neoplasm of lower-inner quadrant of right female breast: Secondary | ICD-10-CM | POA: Diagnosis not present

## 2022-12-23 LAB — RAD ONC ARIA SESSION SUMMARY
Course Elapsed Days: 8
Plan Fractions Treated to Date: 7
Plan Prescribed Dose Per Fraction: 2.66 Gy
Plan Total Fractions Prescribed: 16
Plan Total Prescribed Dose: 42.56 Gy
Reference Point Dosage Given to Date: 18.62 Gy
Reference Point Session Dosage Given: 2.66 Gy
Session Number: 7

## 2022-12-26 ENCOUNTER — Ambulatory Visit: Admission: RE | Admit: 2022-12-26 | Payer: Medicare Other | Source: Ambulatory Visit

## 2022-12-26 ENCOUNTER — Other Ambulatory Visit: Payer: Self-pay

## 2022-12-26 DIAGNOSIS — C50311 Malignant neoplasm of lower-inner quadrant of right female breast: Secondary | ICD-10-CM | POA: Diagnosis not present

## 2022-12-26 LAB — RAD ONC ARIA SESSION SUMMARY
Course Elapsed Days: 11
Plan Fractions Treated to Date: 8
Plan Prescribed Dose Per Fraction: 2.66 Gy
Plan Total Fractions Prescribed: 16
Plan Total Prescribed Dose: 42.56 Gy
Reference Point Dosage Given to Date: 21.28 Gy
Reference Point Session Dosage Given: 2.66 Gy
Session Number: 8

## 2022-12-27 ENCOUNTER — Other Ambulatory Visit: Payer: Self-pay

## 2022-12-27 ENCOUNTER — Ambulatory Visit
Admission: RE | Admit: 2022-12-27 | Discharge: 2022-12-27 | Disposition: A | Discharge status: Home / Self Care | Payer: Medicare Other | Source: Ambulatory Visit | Attending: Radiation Oncology | Admitting: Radiation Oncology

## 2022-12-27 DIAGNOSIS — C50311 Malignant neoplasm of lower-inner quadrant of right female breast: Secondary | ICD-10-CM | POA: Diagnosis not present

## 2022-12-27 LAB — RAD ONC ARIA SESSION SUMMARY
Course Elapsed Days: 12
Plan Fractions Treated to Date: 9
Plan Prescribed Dose Per Fraction: 2.66 Gy
Plan Total Fractions Prescribed: 16
Plan Total Prescribed Dose: 42.56 Gy
Reference Point Dosage Given to Date: 23.94 Gy
Reference Point Session Dosage Given: 2.66 Gy
Session Number: 9

## 2022-12-28 ENCOUNTER — Ambulatory Visit
Admission: RE | Admit: 2022-12-28 | Discharge: 2022-12-28 | Disposition: A | Discharge status: Home / Self Care | Payer: Medicare Other | Source: Ambulatory Visit | Attending: Radiation Oncology | Admitting: Radiation Oncology

## 2022-12-28 ENCOUNTER — Ambulatory Visit: Admission: RE | Admit: 2022-12-28 | Payer: Medicare Other | Source: Ambulatory Visit

## 2022-12-28 ENCOUNTER — Other Ambulatory Visit: Payer: Self-pay

## 2022-12-28 DIAGNOSIS — C50311 Malignant neoplasm of lower-inner quadrant of right female breast: Secondary | ICD-10-CM | POA: Diagnosis not present

## 2022-12-28 LAB — RAD ONC ARIA SESSION SUMMARY
Course Elapsed Days: 13
Plan Fractions Treated to Date: 10
Plan Prescribed Dose Per Fraction: 2.66 Gy
Plan Total Fractions Prescribed: 16
Plan Total Prescribed Dose: 42.56 Gy
Reference Point Dosage Given to Date: 26.6 Gy
Reference Point Session Dosage Given: 2.66 Gy
Session Number: 10

## 2022-12-29 ENCOUNTER — Ambulatory Visit
Admission: RE | Admit: 2022-12-29 | Discharge: 2022-12-29 | Disposition: A | Discharge status: Home / Self Care | Payer: Medicare Other | Source: Ambulatory Visit | Attending: Radiation Oncology | Admitting: Radiation Oncology

## 2022-12-29 ENCOUNTER — Other Ambulatory Visit: Payer: Self-pay

## 2022-12-29 DIAGNOSIS — C50311 Malignant neoplasm of lower-inner quadrant of right female breast: Secondary | ICD-10-CM | POA: Diagnosis not present

## 2022-12-29 LAB — RAD ONC ARIA SESSION SUMMARY
Course Elapsed Days: 14
Plan Fractions Treated to Date: 11
Plan Prescribed Dose Per Fraction: 2.66 Gy
Plan Total Fractions Prescribed: 16
Plan Total Prescribed Dose: 42.56 Gy
Reference Point Dosage Given to Date: 29.26 Gy
Reference Point Session Dosage Given: 2.66 Gy
Session Number: 11

## 2022-12-30 ENCOUNTER — Ambulatory Visit
Admission: RE | Admit: 2022-12-30 | Discharge: 2022-12-30 | Disposition: A | Discharge status: Home / Self Care | Payer: Medicare Other | Source: Ambulatory Visit | Attending: Radiation Oncology | Admitting: Radiation Oncology

## 2022-12-30 ENCOUNTER — Other Ambulatory Visit: Payer: Self-pay

## 2022-12-30 DIAGNOSIS — C50311 Malignant neoplasm of lower-inner quadrant of right female breast: Secondary | ICD-10-CM | POA: Diagnosis not present

## 2022-12-30 LAB — RAD ONC ARIA SESSION SUMMARY
Course Elapsed Days: 15
Plan Fractions Treated to Date: 12
Plan Prescribed Dose Per Fraction: 2.66 Gy
Plan Total Fractions Prescribed: 16
Plan Total Prescribed Dose: 42.56 Gy
Reference Point Dosage Given to Date: 31.92 Gy
Reference Point Session Dosage Given: 2.66 Gy
Session Number: 12

## 2023-01-02 ENCOUNTER — Other Ambulatory Visit: Payer: Self-pay

## 2023-01-02 ENCOUNTER — Ambulatory Visit
Admission: RE | Admit: 2023-01-02 | Discharge: 2023-01-02 | Disposition: A | Discharge status: Home / Self Care | Payer: Medicare Other | Source: Ambulatory Visit | Attending: Radiation Oncology | Admitting: Radiation Oncology

## 2023-01-02 DIAGNOSIS — C50311 Malignant neoplasm of lower-inner quadrant of right female breast: Secondary | ICD-10-CM | POA: Diagnosis not present

## 2023-01-02 LAB — RAD ONC ARIA SESSION SUMMARY
Course Elapsed Days: 18
Plan Fractions Treated to Date: 13
Plan Prescribed Dose Per Fraction: 2.66 Gy
Plan Total Fractions Prescribed: 16
Plan Total Prescribed Dose: 42.56 Gy
Reference Point Dosage Given to Date: 34.58 Gy
Reference Point Session Dosage Given: 2.66 Gy
Session Number: 13

## 2023-01-03 ENCOUNTER — Ambulatory Visit
Admission: RE | Admit: 2023-01-03 | Discharge: 2023-01-03 | Disposition: A | Discharge status: Home / Self Care | Payer: Medicare Other | Source: Ambulatory Visit | Attending: Radiation Oncology | Admitting: Radiation Oncology

## 2023-01-03 ENCOUNTER — Other Ambulatory Visit: Payer: Self-pay

## 2023-01-03 DIAGNOSIS — C50311 Malignant neoplasm of lower-inner quadrant of right female breast: Secondary | ICD-10-CM | POA: Diagnosis not present

## 2023-01-03 LAB — RAD ONC ARIA SESSION SUMMARY
Course Elapsed Days: 19
Plan Fractions Treated to Date: 14
Plan Prescribed Dose Per Fraction: 2.66 Gy
Plan Total Fractions Prescribed: 16
Plan Total Prescribed Dose: 42.56 Gy
Reference Point Dosage Given to Date: 37.24 Gy
Reference Point Session Dosage Given: 2.66 Gy
Session Number: 14

## 2023-01-04 ENCOUNTER — Ambulatory Visit
Admission: RE | Admit: 2023-01-04 | Discharge: 2023-01-04 | Disposition: A | Discharge status: Home / Self Care | Payer: Medicare Other | Source: Ambulatory Visit | Attending: Radiation Oncology | Admitting: Radiation Oncology

## 2023-01-04 ENCOUNTER — Other Ambulatory Visit: Payer: Self-pay

## 2023-01-04 ENCOUNTER — Inpatient Hospital Stay: Payer: Medicare Other

## 2023-01-04 DIAGNOSIS — C50311 Malignant neoplasm of lower-inner quadrant of right female breast: Secondary | ICD-10-CM | POA: Diagnosis not present

## 2023-01-04 LAB — RAD ONC ARIA SESSION SUMMARY
Course Elapsed Days: 20
Plan Fractions Treated to Date: 15
Plan Prescribed Dose Per Fraction: 2.66 Gy
Plan Total Fractions Prescribed: 16
Plan Total Prescribed Dose: 42.56 Gy
Reference Point Dosage Given to Date: 39.9 Gy
Reference Point Session Dosage Given: 2.66 Gy
Session Number: 15

## 2023-01-04 LAB — CBC (CANCER CENTER ONLY)
HCT: 35 % — ABNORMAL LOW (ref 36.0–46.0)
Hemoglobin: 11.7 g/dL — ABNORMAL LOW (ref 12.0–15.0)
MCH: 30.8 pg (ref 26.0–34.0)
MCHC: 33.4 g/dL (ref 30.0–36.0)
MCV: 92.1 fL (ref 80.0–100.0)
Platelet Count: 378 10*3/uL (ref 150–400)
RBC: 3.8 MIL/uL — ABNORMAL LOW (ref 3.87–5.11)
RDW: 13.2 % (ref 11.5–15.5)
WBC Count: 9.3 10*3/uL (ref 4.0–10.5)
nRBC: 0 % (ref 0.0–0.2)

## 2023-01-05 ENCOUNTER — Ambulatory Visit: Admission: RE | Admit: 2023-01-05 | Payer: Medicare Other | Source: Ambulatory Visit

## 2023-01-05 ENCOUNTER — Other Ambulatory Visit: Payer: Self-pay

## 2023-01-05 ENCOUNTER — Ambulatory Visit
Admission: RE | Admit: 2023-01-05 | Discharge: 2023-01-05 | Disposition: A | Discharge status: Home / Self Care | Payer: Medicare Other | Source: Ambulatory Visit | Attending: Radiation Oncology | Admitting: Radiation Oncology

## 2023-01-05 DIAGNOSIS — C50311 Malignant neoplasm of lower-inner quadrant of right female breast: Secondary | ICD-10-CM | POA: Diagnosis not present

## 2023-01-05 LAB — RAD ONC ARIA SESSION SUMMARY
Course Elapsed Days: 21
Plan Fractions Treated to Date: 16
Plan Prescribed Dose Per Fraction: 2.66 Gy
Plan Total Fractions Prescribed: 16
Plan Total Prescribed Dose: 42.56 Gy
Reference Point Dosage Given to Date: 42.56 Gy
Reference Point Session Dosage Given: 2.66 Gy
Session Number: 16

## 2023-01-06 ENCOUNTER — Other Ambulatory Visit: Payer: Self-pay

## 2023-01-06 ENCOUNTER — Ambulatory Visit
Admission: RE | Admit: 2023-01-06 | Discharge: 2023-01-06 | Disposition: A | Discharge status: Home / Self Care | Payer: Medicare Other | Source: Ambulatory Visit | Attending: Radiation Oncology | Admitting: Radiation Oncology

## 2023-01-06 DIAGNOSIS — C50311 Malignant neoplasm of lower-inner quadrant of right female breast: Secondary | ICD-10-CM | POA: Diagnosis not present

## 2023-01-06 LAB — RAD ONC ARIA SESSION SUMMARY
Course Elapsed Days: 22
Plan Fractions Treated to Date: 1
Plan Prescribed Dose Per Fraction: 2 Gy
Plan Total Fractions Prescribed: 5
Plan Total Prescribed Dose: 10 Gy
Reference Point Dosage Given to Date: 2 Gy
Reference Point Session Dosage Given: 2 Gy
Session Number: 17

## 2023-01-10 ENCOUNTER — Other Ambulatory Visit: Payer: Self-pay

## 2023-01-10 ENCOUNTER — Ambulatory Visit
Admission: RE | Admit: 2023-01-10 | Discharge: 2023-01-10 | Disposition: A | Discharge status: Home / Self Care | Payer: Medicare Other | Source: Ambulatory Visit | Attending: Radiation Oncology | Admitting: Radiation Oncology

## 2023-01-10 DIAGNOSIS — C50311 Malignant neoplasm of lower-inner quadrant of right female breast: Secondary | ICD-10-CM | POA: Insufficient documentation

## 2023-01-10 DIAGNOSIS — Z170 Estrogen receptor positive status [ER+]: Secondary | ICD-10-CM | POA: Insufficient documentation

## 2023-01-10 LAB — RAD ONC ARIA SESSION SUMMARY
Course Elapsed Days: 26
Plan Fractions Treated to Date: 2
Plan Prescribed Dose Per Fraction: 2 Gy
Plan Total Fractions Prescribed: 5
Plan Total Prescribed Dose: 10 Gy
Reference Point Dosage Given to Date: 4 Gy
Reference Point Session Dosage Given: 2 Gy
Session Number: 18

## 2023-01-11 ENCOUNTER — Ambulatory Visit
Admission: RE | Admit: 2023-01-11 | Discharge: 2023-01-11 | Disposition: A | Discharge status: Home / Self Care | Payer: Medicare Other | Source: Ambulatory Visit | Attending: Radiation Oncology | Admitting: Radiation Oncology

## 2023-01-11 ENCOUNTER — Other Ambulatory Visit: Payer: Self-pay

## 2023-01-11 DIAGNOSIS — C50311 Malignant neoplasm of lower-inner quadrant of right female breast: Secondary | ICD-10-CM | POA: Diagnosis not present

## 2023-01-11 LAB — RAD ONC ARIA SESSION SUMMARY
Course Elapsed Days: 27
Plan Fractions Treated to Date: 3
Plan Prescribed Dose Per Fraction: 2 Gy
Plan Total Fractions Prescribed: 5
Plan Total Prescribed Dose: 10 Gy
Reference Point Dosage Given to Date: 6 Gy
Reference Point Session Dosage Given: 2 Gy
Session Number: 19

## 2023-01-12 ENCOUNTER — Other Ambulatory Visit: Payer: Self-pay

## 2023-01-12 ENCOUNTER — Ambulatory Visit
Admission: RE | Admit: 2023-01-12 | Discharge: 2023-01-12 | Disposition: A | Discharge status: Home / Self Care | Payer: Medicare Other | Source: Ambulatory Visit | Attending: Radiation Oncology | Admitting: Radiation Oncology

## 2023-01-12 DIAGNOSIS — C50311 Malignant neoplasm of lower-inner quadrant of right female breast: Secondary | ICD-10-CM | POA: Diagnosis not present

## 2023-01-12 LAB — RAD ONC ARIA SESSION SUMMARY
Course Elapsed Days: 28
Plan Fractions Treated to Date: 4
Plan Prescribed Dose Per Fraction: 2 Gy
Plan Total Fractions Prescribed: 5
Plan Total Prescribed Dose: 10 Gy
Reference Point Dosage Given to Date: 8 Gy
Reference Point Session Dosage Given: 2 Gy
Session Number: 20

## 2023-01-13 ENCOUNTER — Ambulatory Visit
Admission: RE | Admit: 2023-01-13 | Discharge: 2023-01-13 | Disposition: A | Discharge status: Home / Self Care | Payer: Medicare Other | Source: Ambulatory Visit | Attending: Radiation Oncology | Admitting: Radiation Oncology

## 2023-01-13 ENCOUNTER — Other Ambulatory Visit: Payer: Self-pay

## 2023-01-13 DIAGNOSIS — C50311 Malignant neoplasm of lower-inner quadrant of right female breast: Secondary | ICD-10-CM | POA: Diagnosis not present

## 2023-01-13 LAB — RAD ONC ARIA SESSION SUMMARY
Course Elapsed Days: 29
Plan Fractions Treated to Date: 5
Plan Prescribed Dose Per Fraction: 2 Gy
Plan Total Fractions Prescribed: 5
Plan Total Prescribed Dose: 10 Gy
Reference Point Dosage Given to Date: 10 Gy
Reference Point Session Dosage Given: 2 Gy
Session Number: 21

## 2023-01-24 ENCOUNTER — Encounter: Payer: Self-pay | Admitting: *Deleted

## 2023-01-24 ENCOUNTER — Inpatient Hospital Stay: Payer: Medicare Other | Attending: Internal Medicine | Admitting: Internal Medicine

## 2023-01-24 VITALS — BP 146/68 | HR 84 | Temp 98.7°F | Wt 171.0 lb

## 2023-01-24 DIAGNOSIS — Z79811 Long term (current) use of aromatase inhibitors: Secondary | ICD-10-CM | POA: Diagnosis not present

## 2023-01-24 DIAGNOSIS — E785 Hyperlipidemia, unspecified: Secondary | ICD-10-CM | POA: Insufficient documentation

## 2023-01-24 DIAGNOSIS — C50811 Malignant neoplasm of overlapping sites of right female breast: Secondary | ICD-10-CM | POA: Diagnosis present

## 2023-01-24 DIAGNOSIS — Z9189 Other specified personal risk factors, not elsewhere classified: Secondary | ICD-10-CM | POA: Insufficient documentation

## 2023-01-24 DIAGNOSIS — C50411 Malignant neoplasm of upper-outer quadrant of right female breast: Secondary | ICD-10-CM

## 2023-01-24 DIAGNOSIS — Z801 Family history of malignant neoplasm of trachea, bronchus and lung: Secondary | ICD-10-CM | POA: Diagnosis not present

## 2023-01-24 DIAGNOSIS — Z170 Estrogen receptor positive status [ER+]: Secondary | ICD-10-CM | POA: Diagnosis not present

## 2023-01-24 DIAGNOSIS — Z803 Family history of malignant neoplasm of breast: Secondary | ICD-10-CM | POA: Insufficient documentation

## 2023-01-24 DIAGNOSIS — Z8541 Personal history of malignant neoplasm of cervix uteri: Secondary | ICD-10-CM | POA: Insufficient documentation

## 2023-01-24 MED ORDER — LETROZOLE 2.5 MG PO TABS: 2.5000 mg | ORAL_TABLET | Freq: Every day | ORAL | 0 refills | Status: AC

## 2023-01-24 NOTE — Progress Notes (Signed)
[facility] CONSULT NOTE  Patient Care Team: Zada Finders Joycie Peek, MD as PCP - General (Family Medicine) Hulen Luster, RN as Oncology Nurse Navigator Michaelyn Barter, MD as Consulting Physician (Oncology)  REFERRING PROVIDER: Dr. Zada Finders  REASON FOR REFFERAL: Right breast cancer  CANCER STAGING   Cancer Staging  Breast cancer [facility]) Staging form: Breast, AJCC 8th Edition - Clinical stage from 10/11/2022: Stage IA (cT1b, cN0, cM0, G1, ER+, PR+, HER2-) - Signed by Michaelyn Barter, MD on 11/17/2022 Stage prefix: Initial diagnosis Histologic grading system: 3 grade system - Pathologic stage from 10/26/2022: Stage IA (pT1b, pN0(sn), cM0, G2, ER+, PR+, HER2-, Oncotype DX score: 16) - Signed by Michaelyn Barter, MD on 11/17/2022 Stage prefix: Initial diagnosis Method of lymph node assessment: Sentinel lymph node biopsy Multigene prognostic tests performed: Oncotype DX Recurrence score range: Greater than or equal to 11 Histologic grading system: 3 grade system   ASSESSMENT & PLAN:  Megan Montgomery 77 y.o. 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  # Right breast IDC, Stage IA ER/PR+, HER2- -detected on a screening mammogram. Diagnostic mammogram and ultrasound from 09/30/2022 showed 9 x 6 x 9 mm spiculated hypoechoic mass at 330 o'clock position 5 cm from the nipple.  No suspicious lymph nodes.  Status post biopsy showed IDC, 7 mm in size, overall grade 1, LVI negative, ER more than 90%, PR 10 to 20%, HER2 equivocal by IHC and negative by FISH.  -s/p right breast lumpectomy with SLNB by Dr. Maia Plan. Pathology showed IDC, with component of DCIS, 0/1 lymph node negative for malignancy.  Tumor measuring 10 mm, overall grade 2, LVI negative, margins negative.    - Oncotype DX score was 16 so there is no benefit of adjuvant  chemotherapy.   - Completed adjuvant RT from 12/15/2022 through 01/10/2023 -Discussed about starting letrozole 2.5 mg once daily for 5 years.  Side effects were rediscussed such as mood changes, myalgia, arthralgia, osteoporosis, hyperlipidemia was discussed.  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  Has previously taken Boniva.  She is taking calcium vitamin D Gummies.  Repeat bone density scan in September 2025 -Due for screening mammogram in May 2025.  # Family history of breast cancer -She has family history of breast cancer and kidney cancer in mother.  Father lung and prostate cancer. -Invitae gene panel negative  # History of cervical cancer -Status post surgery in 1988  # Hyperlipidemia -Simvastatin  RTC in 3 months for MD visit, labs for letrozole toxicity check  Orders Placed This Encounter  Procedures   CBC with Differential (Cancer Center Only)    Standing Status:   Future    Standing Expiration Date:   01/24/2024   CMP (Cancer Center only)    Standing Status:   Future    Standing Expiration Date:   01/24/2024    The total time spent in the appointment was 30 minutes encounter with patients including review of chart and various tests results, discussions about plan of care and coordination of care plan   All questions were answered. The patient knows to call the clinic with any problems, questions or concerns. No barriers to learning was detected.  Michaelyn Barter, MD 9/17/202411:54 AM   HISTORY OF PRESENTING ILLNESS:  Megan Montgomery 77 y.o. 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  Interval history Patient was seen today as follow-up to discuss about endocrine therapy. Completed radiation couple of weeks ago.  Has some discomfort around the nipple  which has been present since surgery.  Has skin changes from radiation which is slowly healing.  Has fatigue at baseline but worsened with radiation.  She is scheduled to see her primary today.  I have reviewed her chart and materials related to her cancer extensively and collaborated history with the patient. Summary of oncologic history is as follows: Oncology History  Breast cancer (HCC)  09/26/2022 Mammogram   Screening mammogram FINDINGS: In the right breast, a possible mass warrants further evaluation. In the left breast, no findings suspicious for malignancy.   IMPRESSION: Further evaluation is suggested for a possible mass in the right breast.  Diagnostic mammogram and ultrasound FINDINGS: Spot tomosynthesis views of the right breast demonstrate a 10 mm spiculated mass in the inner central right breast middle depth (spot CC image 29/43, spot MLO image 25/48). This corresponds with the mass seen on screening mammogram.   Targeted right breast ultrasound was performed. At 3:30 o'clock 5 cm from the nipple, there is a spiculated hypoechoic mass that measures 9 x 6 x 9 mm. There is no definite internal vascularity. This corresponds with the spiculated mass seen on mammogram.   Targeted right axillary ultrasound demonstrates multiple morphologically benign lymph nodes. No lymphadenopathy.   IMPRESSION: Right breast 9 mm spiculated mass in the 3:30 o'clock position is concerning for malignancy. Recommend further assessment with ultrasound-guided biopsy   10/11/2022 Pathology Results   DIAGNOSIS:  A. BREAST, RIGHT 3:30 5 CMFN; ULTRASOUND-GUIDED BIOPSY:  - INVASIVE MAMMARY CARCINOMA, NO SPECIAL TYPE.  Size of invasive carcinoma: 7 mm in this sample  Histologic grade of invasive carcinoma: Grade 1                       Glandular/tubular differentiation score: 2                       Nuclear pleomorphism score: 2                       Mitotic rate score: 1                        Total score: 5  Ductal carcinoma in situ: Present, intermediate grade  Lymphovascular invasion: Not identified   ADDENDUM:  CASE SUMMARY: BREAST BIOMARKER TESTS  Estrogen Receptor (ER) Status: POSITIVE          Percentage of cells with nuclear positivity: Greater than 90%          Average intensity of staining: Strong   Progesterone Receptor (PgR) Status: POSITIVE          Percentage of cells with nuclear positivity: 10-20%          Average intensity of staining: Moderate   HER2 (by immunohistochemistry): EQUIVOCAL (Score 2+)   CASE SUMMARY: BREAST BIOMARKER TESTS TEST(S) PERFORMED: HER2 (by in situ hybridization): NEGATIVE, Group 5 Number of observers: 2 Number of invasive tumor cells counted: 40 Dual probe assay    Average number of HER2 signals per cell: 2.0    Average number of CEP17 signals per cell: 1.7  HER2/CEP17 ratio: 1.18     10/11/2022 Cancer Staging   Staging form: Breast, AJCC 8th Edition - Clinical stage from 10/11/2022: Stage IA (cT1b, cN0, cM0, G1, ER+, PR+, HER2-) - Signed by Michaelyn Barter, MD on 11/17/2022 Stage prefix: Initial diagnosis Histologic grading system: 3 grade system   10/26/2022 Cancer Staging   Staging form: Breast, AJCC 8th Edition - Pathologic stage from 10/26/2022: Stage IA (pT1b, pN0(sn), cM0, G2, ER+, PR+, HER2-, Oncotype DX score: 16) - Signed by Michaelyn Barter, MD on 11/17/2022 Stage prefix: Initial diagnosis Method of lymph node assessment: Sentinel lymph node biopsy Multigene prognostic tests performed: Oncotype DX Recurrence score range: Greater than or equal to 11 Histologic grading system: 3 grade system    Menarche age 43 Children 2 Age at first birth 58 Birth control yes OCP more than 5 years Hysterectomy 1988 took 1 ovary out for cervical cancer HRT yes for about a year History of breast biopsies yes 25 years ago a cyst which was aspirated  Family history- Mother-breast cancer and kidney cancer Father-lung and prostate  cancer Maternal cousin-lymphoma  MEDICAL HISTORY:  Past Medical History:  Diagnosis Date   Breast cancer (HCC) 10/18/2022   Cervical cancer (HCC) 1988   Degenerative disc disease, thoracic    Dry eye syndrome    Family history of adverse reaction to anesthesia    dad-hard to intubate   GERD (gastroesophageal reflux disease)    Glaucoma    Headache    h/o migraines   Hyperlipidemia    Hypertension    PAT (paroxysmal atrial tachycardia)    Skin cancer    Neck    SURGICAL HISTORY: Past Surgical History:  Procedure Laterality Date   ABDOMINAL HYSTERECTOMY     BREAST BIOPSY Right    neg bx/clip   BREAST BIOPSY Right 10/11/2022   u/s bx, 3:30 RIBBON clip-path pendinf   BREAST BIOPSY Right 10/11/2022   [place] RT BREAST BX W LOC DEV 1ST LESION IMG BX SPEC US GUIDE 10/11/2022 [facility]   CATARACT EXTRACTION W/ INTRAOCULAR LENS  IMPLANT, BILATERAL Bilateral    COLONOSCOPY  2018   ESOPHAGOGASTRODUODENOSCOPY     KNEE ARTHROSCOPY WITH SUBCHONDROPLASTY Right 11/25/2019   Procedure: Right knee arthroscopic partial medial meniscectomy with subchondroplasty of the medial tibial plateau.;  Surgeon: Signa Kell, MD;  Location: [facility] ORS;  Service: Orthopedics;  Laterality: Right;   OOPHORECTOMY Right    PART MASTECTOMY,RADIO FREQUENCY LOCALIZER,AXILLARY SENTINEL NODE BIOPSY Right 10/26/2022   Procedure: PART MASTECTOMY,RADIO FREQUENCY LOCALIZER,AXILLARY SENTINEL NODE BIOPSY;  Surgeon: Carolan Shiver, MD;  Location: [facility] OR[facility];  Service: General;  Laterality: Right;   SHOULDER SURGERY Left 2014   partial removal of AC joint d/t osteoarthritis   TONSILLECTOMY     TOTAL KNEE ARTHROPLASTY Right 01/26/2022   Procedure: TOTAL KNEE ARTHROPLASTY - RNFA;  Surgeon: Christena Flake, MD;  Location: [facility] ORS;  Service: Orthopedics;  Laterality: Right;    SOCIAL HISTORY: Social History   Socioeconomic History   Marital status: Divorced    Spouse name: Not on file   Number of children: Not on  file   Years of education: Not on file   Highest education level: Not on file  Occupational History   Not on file  Tobacco Use   Smoking status: Former    Current packs/day: 0.00    Average packs/day: 1.5 packs/day for 15.0 years (22.5 ttl pk-yrs)    Types: Cigarettes    Start date: 55    Quit date: 108  Years since quitting: 32.7   Smokeless tobacco: Never  Vaping Use   Vaping status: Never Used  Substance and Sexual Activity   Alcohol use: Yes    Alcohol/week: 7.0 standard drinks of alcohol    Types: 7 Shots of liquor per week    Comment: nightly   Drug use: No   Sexual activity: Not on file  Other Topics Concern   Not on file  Social History Narrative   Not on file   Social Determinants of Health   Financial Resource Strain: Low Risk  (12/24/2022)   Received from [facility]   Overall Financial Resource Strain (CARDIA)    Difficulty of Paying Living Expenses: Not hard at all  Food Insecurity: No Food Insecurity (12/24/2022)   Received from [facility]   Hunger Vital Sign    Worried About Running Out of Food in the Last Year: Never true    Ran Out of Food in the Last Year: Never true  Transportation Needs: No Transportation Needs (12/24/2022)   Received from [facility] - [facility]    In the past 12 months, has lack of transportation kept you from medical appointments or from getting medications?: No    Lack of Transportation (Non-Medical): No  Physical Activity: Not on file  Stress: Not on file  Social Connections: Not on file  Intimate Partner Violence: Not At Risk (01/26/2022)   Humiliation, Afraid, Rape, and Kick questionnaire    Fear of Current or Ex-Partner: No    Emotionally Abused: No    Physically Abused: No    Sexually Abused: No    FAMILY HISTORY: Family History  Problem Relation Age of Onset   Breast cancer Mother 13   Kidney cancer Mother 46   Lung cancer Father 37    Prostate cancer Father 46   Pancreatic cancer Cousin        dx 44s   Lymphoma Cousin     ALLERGIES:  is allergic to bee venom, hydrocodone, ivp dye [iodinated contrast media], penicillins, and septra [sulfamethoxazole-trimethoprim].  MEDICATIONS:  Current Outpatient Medications  Medication Sig Dispense Refill   acetaminophen (TYLENOL) 500 MG tablet Take 1-2 tablets (500-1,000 mg total) by mouth every 6 (six) hours as needed. (Patient taking differently: Take 1,000 mg by mouth 3 (three) times daily.) 30 tablet 0   amLODipine (NORVASC) 5 MG tablet Take 5 mg by mouth every morning.     calcium-vitamin D (OSCAL WITH D) 500-200 MG-UNIT TABS tablet Take 2 tablets by mouth daily. gummies     hydrochlorothiazide (HYDRODIURIL) 25 MG tablet Take 25 mg by mouth every morning.     latanoprost (XALATAN) 0.005 % ophthalmic solution Place 1 drop into the right eye at bedtime.     letrozole (FEMARA) 2.5 MG tablet Take 1 tablet (2.5 mg total) by mouth daily. 90 tablet 0   lisinopril (PRINIVIL,ZESTRIL) 20 MG tablet Take 20 mg by mouth 2 (two) times daily.     Melatonin 5 MG TABS Take 5 mg by mouth at bedtime.      meloxicam (MOBIC) 7.5 MG tablet Take 7.5 mg by mouth 2 (two) times daily.     Multiple Vitamins-Minerals (CENTRUM SILVER 50+WOMEN) TABS Take 1 tablet by mouth daily.     omeprazole (PRILOSEC) 40 MG capsule Take 40 mg by mouth every morning.     ondansetron (ZOFRAN) 4 MG tablet Take 1 tablet (4 mg total) by mouth every 6 (six) hours as  needed for nausea. 30 tablet 0   oxyCODONE (OXY IR/ROXICODONE) 5 MG immediate release tablet Take 1-2 tablets (5-10 mg total) by mouth every 4 (four) hours as needed for severe pain. 40 tablet 0   Propylene Glycol (SYSTANE COMPLETE) 0.6 % SOLN Place 1 drop into both eyes 3 (three) times daily.     simvastatin (ZOCOR) 10 MG tablet Take 10 mg by mouth at bedtime.      traMADol (ULTRAM) 50 MG tablet Take 1 tablet (50 mg total) by mouth every 6 (six) hours as needed. 20  tablet 0   No current facility-administered medications for this visit.    REVIEW OF SYSTEMS:   Pertinent information mentioned in HPI All other systems were reviewed with the patient and are negative.  PHYSICAL EXAMINATION: ECOG PERFORMANCE STATUS: 0 - Asymptomatic  Vitals:   01/24/23 1052 01/24/23 1054  BP: (!) 144/87 (!) 146/68  Pulse: 84   Temp: 98.7 F (37.1 C)   SpO2: 100%    Filed Weights   01/24/23 1052  Weight: 171 lb (77.6 kg)    GENERAL:alert, no distress and comfortable SKIN: skin color, texture, turgor are normal, no rashes or significant lesions EYES: normal, conjunctiva are pink and non-injected, sclera clear OROPHARYNX:no exudate, no erythema and lips, buccal mucosa, and tongue normal  NECK: supple, thyroid normal size, non-tender, without nodularity LYMPH:  no palpable lymphadenopathy in the cervical, axillary or inguinal LUNGS: clear to auscultation and percussion with normal breathing effort HEART: regular rate & rhythm and no murmurs and no lower extremity edema ABDOMEN:abdomen soft, non-tender and normal bowel sounds Musculoskeletal:no cyanosis of digits and no clubbing  PSYCH: alert & oriented x 3 with fluent speech NEURO: no focal motor/sensory deficits  LABORATORY DATA:  I have reviewed the data as listed Lab Results  Component Value Date   WBC 9.3 01/04/2023   HGB 11.7 (L) 01/04/2023   HCT 35.0 (L) 01/04/2023   MCV 92.1 01/04/2023   PLT 378 01/04/2023   Recent Labs    01/27/22 0419  NA 128*  K 4.1  CL 92*  CO2 27  GLUCOSE 140*  BUN 25*  CREATININE 0.76  CALCIUM 9.0  GFRNONAA >60    RADIOGRAPHIC STUDIES: I have personally reviewed the radiological images as listed and agreed with the findings in the report. No results found.

## 2023-01-24 NOTE — Progress Notes (Signed)
Patient is feeling about the same as her last visit, still a little more fatigued, and she is still having some pain/discomfort around her right nipple and the discoloration.

## 2023-01-26 ENCOUNTER — Ambulatory Visit: Payer: Medicare Other | Admitting: Internal Medicine

## 2023-01-26 ENCOUNTER — Ambulatory Visit: Payer: Medicare Other | Admitting: Oncology

## 2023-02-16 ENCOUNTER — Encounter: Payer: Self-pay | Admitting: Radiation Oncology

## 2023-02-16 ENCOUNTER — Ambulatory Visit
Admission: RE | Admit: 2023-02-16 | Discharge: 2023-02-16 | Disposition: A | Discharge status: Home / Self Care | Payer: Medicare Other | Source: Ambulatory Visit | Attending: Radiation Oncology | Admitting: Radiation Oncology

## 2023-02-16 VITALS — BP 151/72 | HR 77 | Temp 98.6°F | Resp 16 | Ht 67.0 in | Wt 172.0 lb

## 2023-02-16 DIAGNOSIS — Z170 Estrogen receptor positive status [ER+]: Secondary | ICD-10-CM | POA: Insufficient documentation

## 2023-02-16 DIAGNOSIS — C50311 Malignant neoplasm of lower-inner quadrant of right female breast: Secondary | ICD-10-CM | POA: Diagnosis present

## 2023-02-16 DIAGNOSIS — Z923 Personal history of irradiation: Secondary | ICD-10-CM | POA: Insufficient documentation

## 2023-02-16 DIAGNOSIS — Z79811 Long term (current) use of aromatase inhibitors: Secondary | ICD-10-CM | POA: Insufficient documentation

## 2023-02-16 DIAGNOSIS — C50411 Malignant neoplasm of upper-outer quadrant of right female breast: Secondary | ICD-10-CM

## 2023-02-16 NOTE — Progress Notes (Signed)
Radiation Oncology Follow up Note  Name: Megan Montgomery   Date:   02/16/2023 MRN:  [id number] DOB: Jan 19, 1946    This 77 y.o. female presents to the clinic today for 1 month follow-up status post whole breast radiation to her right breast for stage Ia ER/PR positive invasive mammary carcinoma.  REFERRING PROVIDER: Dione Housekeeper, *  HPI: Patient is a 77 year old female now out 1 month of completed whole breast radiation to her right breast for stage Ia ER/PR positive invasive mammary carcinoma.  Seen today in routine follow-up she is doing well she has some hyperpigmentation of some of her nevi on the skin.  Otherwise is without complaint specifically denies breast tenderness cough or bone pain.  She has been started on Femara tolerating it well without side effect.  COMPLICATIONS OF TREATMENT: none  FOLLOW UP COMPLIANCE: keeps appointments   PHYSICAL EXAM:  BP (!) 151/72   Pulse 77   Temp 98.6 F (37 C) (Tympanic)   Resp 16   Ht 5\' 7"  (1.702 m) Comment: stated Ht  Wt 172 lb (78 kg)   BMI 26.94 kg/m  Lungs are clear to A&P cardiac examination essentially unremarkable with regular rate and rhythm. No dominant mass or nodularity is noted in either breast in 2 positions examined. Incision is well-healed. No axillary or supraclavicular adenopathy is appreciated. Cosmetic result is excellent.  Well-developed well-nourished patient in NAD. HEENT reveals PERLA, EOMI, discs not visualized.  Oral cavity is clear. No oral mucosal lesions are identified. Neck is clear without evidence of cervical or supraclavicular adenopathy. Lungs are clear to A&P. Cardiac examination is essentially unremarkable with regular rate and rhythm without murmur rub or thrill. Abdomen is benign with no organomegaly or masses noted. Motor sensory and DTR levels are equal and symmetric in the upper and lower extremities. Cranial nerves II through XII are grossly intact. Proprioception is intact. No peripheral adenopathy  or edema is identified. No motor or sensory levels are noted. Crude visual fields are within normal range.  RADIOLOGY RESULTS: No current films for review  PLAN: Present time patient is doing well 1 month out from whole breast radiation and pleased with her overall progress she continues on Femara without side effect.  Of asked to see her back in 6 months for follow-up.  Patient is to call with any concerns.    Carmina Miller, MD

## 2023-02-16 NOTE — Progress Notes (Signed)
Survivorship Care Plan visit completed.  Treatment summary reviewed and given to patient.  ASCO answers booklet reviewed and given to patient.  CARE program and Cancer Transitions discussed with patient along with other resources cancer center offers to patients and caregivers.  Patient verbalized understanding. 

## 2023-04-25 ENCOUNTER — Inpatient Hospital Stay: Payer: Medicare Other | Attending: Internal Medicine | Admitting: Internal Medicine

## 2023-04-25 ENCOUNTER — Inpatient Hospital Stay: Payer: Medicare Other

## 2023-04-25 VITALS — BP 136/54 | HR 81 | Temp 97.6°F | Resp 18 | Wt 173.8 lb

## 2023-04-25 DIAGNOSIS — Z8541 Personal history of malignant neoplasm of cervix uteri: Secondary | ICD-10-CM | POA: Insufficient documentation

## 2023-04-25 DIAGNOSIS — Z79811 Long term (current) use of aromatase inhibitors: Secondary | ICD-10-CM | POA: Diagnosis not present

## 2023-04-25 DIAGNOSIS — C50411 Malignant neoplasm of upper-outer quadrant of right female breast: Secondary | ICD-10-CM

## 2023-04-25 DIAGNOSIS — C50811 Malignant neoplasm of overlapping sites of right female breast: Secondary | ICD-10-CM | POA: Insufficient documentation

## 2023-04-25 DIAGNOSIS — Z87891 Personal history of nicotine dependence: Secondary | ICD-10-CM | POA: Diagnosis not present

## 2023-04-25 DIAGNOSIS — Z170 Estrogen receptor positive status [ER+]: Secondary | ICD-10-CM | POA: Diagnosis not present

## 2023-04-25 LAB — CMP (CANCER CENTER ONLY)
ALT: 18 U/L (ref 0–44)
AST: 22 U/L (ref 15–41)
Albumin: 4 g/dL (ref 3.5–5.0)
Alkaline Phosphatase: 62 U/L (ref 38–126)
Anion gap: 9 (ref 5–15)
BUN: 25 mg/dL — ABNORMAL HIGH (ref 8–23)
CO2: 27 mmol/L (ref 22–32)
Calcium: 9.3 mg/dL (ref 8.9–10.3)
Chloride: 102 mmol/L (ref 98–111)
Creatinine: 0.79 mg/dL (ref 0.44–1.00)
GFR, Estimated: >60 mL/min (ref >60)
Glucose, Bld: 95 mg/dL (ref 70–99)
Potassium: 3.9 mmol/L (ref 3.5–5.1)
Sodium: 138 mmol/L (ref 135–145)
Total Bilirubin: 0.4 mg/dL (ref <1.2)
Total Protein: 7.3 g/dL (ref 6.5–8.1)

## 2023-04-25 LAB — CBC WITH DIFFERENTIAL (CANCER CENTER ONLY)
Abs Immature Granulocytes: 0.03 10*3/uL (ref 0.00–0.07)
Basophils Absolute: 0.1 10*3/uL (ref 0.0–0.1)
Basophils Relative: 1 %
Eosinophils Absolute: 0.1 10*3/uL (ref 0.0–0.5)
Eosinophils Relative: 1 %
HCT: 35.3 % — ABNORMAL LOW (ref 36.0–46.0)
Hemoglobin: 11.9 g/dL — ABNORMAL LOW (ref 12.0–15.0)
Immature Granulocytes: 1 %
Lymphocytes Relative: 23 %
Lymphs Abs: 1.5 10*3/uL (ref 0.7–4.0)
MCH: 31.3 pg (ref 26.0–34.0)
MCHC: 33.7 g/dL (ref 30.0–36.0)
MCV: 92.9 fL (ref 80.0–100.0)
Monocytes Absolute: 0.4 10*3/uL (ref 0.1–1.0)
Monocytes Relative: 7 %
Neutro Abs: 4.3 10*3/uL (ref 1.7–7.7)
Neutrophils Relative %: 67 %
Platelet Count: 325 10*3/uL (ref 150–400)
RBC: 3.8 MIL/uL — ABNORMAL LOW (ref 3.87–5.11)
RDW: 13.6 % (ref 11.5–15.5)
WBC Count: 6.3 10*3/uL (ref 4.0–10.5)
nRBC: 0 % (ref 0.0–0.2)

## 2023-04-25 MED ORDER — ANASTROZOLE 1 MG PO TABS: 1.0000 mg | ORAL_TABLET | Freq: Every day | ORAL | 0 refills | Status: DC

## 2023-04-25 NOTE — Progress Notes (Signed)
[facility] CONSULT NOTE  Patient Care Team: Zada Finders Joycie Peek, MD as PCP - General (Family Medicine) Hulen Luster, RN as Oncology Nurse Navigator Michaelyn Barter, MD as Consulting Physician (Oncology) Carmina Miller, MD as Consulting Physician (Radiation Oncology) Carolan Shiver, MD as Consulting Physician (General Surgery)  REFERRING PROVIDER: Dr. Zada Finders  REASON FOR REFFERAL: Right breast cancer  CANCER STAGING   Cancer Staging  Breast cancer [facility]) Staging form: Breast, AJCC 8th Edition - Clinical stage from 10/11/2022: Stage IA (cT1b, cN0, cM0, G1, ER+, PR+, HER2-) - Signed by Michaelyn Barter, MD on 11/17/2022 Stage prefix: Initial diagnosis Histologic grading system: 3 grade system - Pathologic stage from 10/26/2022: Stage IA (pT1b, pN0(sn), cM0, G2, ER+, PR+, HER2-, Oncotype DX score: 16) - Signed by Michaelyn Barter, MD on 11/17/2022 Stage prefix: Initial diagnosis Method of lymph node assessment: Sentinel lymph node biopsy Multigene prognostic tests performed: Oncotype DX Recurrence score range: Greater than or equal to 11 Histologic grading system: 3 grade system   ASSESSMENT & PLAN:  Megan Montgomery 77 y.o. 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  # Right breast IDC, Stage IA ER/PR+, HER2- -detected on a screening mammogram. Diagnostic mammogram and ultrasound from 09/30/2022 showed 9 x 6 x 9 mm spiculated hypoechoic mass at 330 o'clock position 5 cm from the nipple.  No suspicious lymph nodes.  Status post biopsy showed IDC, 7 mm in size, overall grade 1, LVI negative, ER more than 90%, PR 10 to 20%, HER2 equivocal by IHC and negative by FISH.  -s/p right breast lumpectomy with SLNB by Dr. Maia Plan. Pathology showed IDC, with component of DCIS, 0/1 lymph node negative for malignancy.  Tumor  measuring 10 mm, overall grade 2, LVI negative, margins negative.    - Oncotype DX score was 16 so there is no benefit of adjuvant chemotherapy.   - Completed adjuvant RT from 12/15/2022 through 01/10/2023  -Started letrozole on 01/25/2023.  Since starting letrozole, she has been noticing more drowsiness and sleepiness.  She does not think that she can continue with it.  It is compromising her quality of life.  It is making it difficult for her to drive.  Reports this all started after letrozole.  Did not have issues prior.  Otherwise has been tolerating fine.  Discussed about switching to anastrozole 1 mg daily and see how she tolerates it.  She was agreeable with the plan.  Will reassess in 8 weeks.  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  Has previously taken Boniva.  She is taking calcium vitamin D Gummies.  Repeat bone density scan in September 2025  -Due for screening mammogram in May 2025.  # Family history of breast cancer -She has family history of breast cancer and kidney cancer in mother.  Father lung and prostate cancer. -Invitae gene panel negative  # History of cervical cancer -Status post surgery in 1988  # Hyperlipidemia -Simvastatin  RTC in 2 months for MD visit for anastrozole toxicity check.  Orders Placed This Encounter  Procedures   MM DIAG BREAST TOMO BILATERAL    Standing Status:   Future    Expected Date:   09/12/2023    Expiration Date:   04/24/2024    Reason for Exam (SYMPTOM  OR DIAGNOSIS REQUIRED):  Malignant neoplasm of upper-outer quadrant of right breast    Preferred imaging location?:   [facility]    The total time spent in the appointment was 30 minutes encounter with patients including review of chart and various tests results, discussions about plan of care and coordination of care plan   All questions were answered. The patient knows to call the clinic with any problems, questions or  concerns. No barriers to learning was detected.  Michaelyn Barter, MD 12/17/20242:17 PM   HISTORY OF PRESENTING ILLNESS:  Narely Degraw 77 y.o. 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  Interval history Patient was seen today as follow-up for letrozole toxicity check and labs. Patient reports initiation of the letrozole, she has been having issues with drowsiness and sleepiness.  Making it difficult for her to drive.  Does not think she can keep going with this as it is affecting her quality of life.  Otherwise denies any hot flashes, arthralgia or myalgia.  She has mild pain in her right thumb which we discussed is less likely to be related to letrozole.  Also complains of some mucositis in her right cheek which is less likely to be letrozole related..  I have reviewed her chart and materials related to her cancer extensively and collaborated history with the patient. Summary of oncologic history is as follows: Oncology History  Breast cancer (HCC)  09/26/2022 Mammogram   Screening mammogram FINDINGS: In the right breast, a possible mass warrants further evaluation. In the left breast, no findings suspicious for malignancy.   IMPRESSION: Further evaluation is suggested for a possible mass in the right breast.  Diagnostic mammogram and ultrasound FINDINGS: Spot tomosynthesis views of the right breast demonstrate a 10 mm spiculated mass in the inner central right breast middle depth (spot CC image 29/43, spot MLO image 25/48). This corresponds with the mass seen on screening mammogram.   Targeted right breast ultrasound was performed. At 3:30 o'clock 5 cm from the nipple, there is a spiculated hypoechoic mass that measures 9 x 6 x 9 mm. There is no definite internal vascularity. This corresponds with the spiculated mass  seen on mammogram.   Targeted right axillary ultrasound demonstrates multiple morphologically benign lymph nodes. No lymphadenopathy.   IMPRESSION: Right breast 9 mm spiculated mass in the 3:30 o'clock position is concerning for malignancy. Recommend further assessment with ultrasound-guided biopsy   10/11/2022 Pathology Results   DIAGNOSIS:  A. BREAST, RIGHT 3:30 5 CMFN; ULTRASOUND-GUIDED BIOPSY:  - INVASIVE MAMMARY CARCINOMA, NO SPECIAL TYPE.  Size of invasive carcinoma: 7 mm in this sample  Histologic grade of invasive carcinoma: Grade 1                       Glandular/tubular differentiation score: 2                       Nuclear pleomorphism score: 2                       Mitotic rate score: 1                       Total score: 5  Ductal carcinoma in situ: Present, intermediate grade  Lymphovascular invasion: Not identified   ADDENDUM:  CASE SUMMARY: BREAST BIOMARKER TESTS  Estrogen Receptor (ER) Status: POSITIVE          Percentage of cells with nuclear positivity: Greater than 90%          Average intensity of staining: Strong   Progesterone Receptor (PgR) Status: POSITIVE          Percentage of cells with nuclear positivity: 10-20%          Average intensity of staining: Moderate   HER2 (by immunohistochemistry): EQUIVOCAL (Score 2+)   CASE SUMMARY: BREAST BIOMARKER TESTS TEST(S) PERFORMED: HER2 (by in situ hybridization): NEGATIVE, Group 5 Number of observers: 2 Number of invasive tumor cells counted: 40 Dual probe assay    Average number of HER2 signals per cell: 2.0    Average number of CEP17 signals per cell: 1.7    HER2/CEP17 ratio: 1.18     10/11/2022 Cancer Staging   Staging form: Breast, AJCC 8th Edition - Clinical stage from 10/11/2022: Stage IA (cT1b, cN0, cM0, G1, ER+, PR+, HER2-) - Signed by Michaelyn Barter, MD on 11/17/2022 Stage prefix: Initial diagnosis Histologic grading system: 3 grade system   10/26/2022 Cancer Staging   Staging form: Breast,  AJCC 8th Edition - Pathologic stage from 10/26/2022: Stage IA (pT1b, pN0(sn), cM0, G2, ER+, PR+, HER2-, Oncotype DX score: 16) - Signed by Michaelyn Barter, MD on 11/17/2022 Stage prefix: Initial diagnosis Method of lymph node assessment: Sentinel lymph node biopsy Multigene prognostic tests performed: Oncotype DX Recurrence score range: Greater than or equal to 11 Histologic grading system: 3 grade system    Menarche age 76 Children 2 Age at first birth 89 Birth control yes OCP more than 5 years Hysterectomy 1988 took 1 ovary out for cervical cancer HRT yes for about a year History of breast biopsies yes 25 years ago a cyst which was aspirated  Family history- Mother-breast cancer and kidney cancer Father-lung and prostate cancer Maternal cousin-lymphoma  MEDICAL HISTORY:  Past Medical History:  Diagnosis Date   Breast cancer (HCC) 10/18/2022   Cervical cancer (HCC) 1988   Degenerative disc disease, thoracic    Dry eye syndrome    Family history of adverse reaction to anesthesia    dad-hard to intubate   GERD (gastroesophageal reflux disease)    Glaucoma    Headache    h/o migraines   Hyperlipidemia    Hypertension    PAT (paroxysmal atrial tachycardia) (HCC)    Skin cancer    Neck    SURGICAL HISTORY: Past Surgical History:  Procedure Laterality Date   ABDOMINAL HYSTERECTOMY     BREAST BIOPSY Right    neg bx/clip   BREAST BIOPSY Right 10/11/2022   u/s bx, 3:30 RIBBON clip-path pendinf   BREAST BIOPSY Right 10/11/2022   [place] RT BREAST BX W LOC DEV 1ST LESION IMG BX SPEC US GUIDE 10/11/2022 [facility]   CATARACT EXTRACTION W/ INTRAOCULAR LENS  IMPLANT, BILATERAL Bilateral    COLONOSCOPY  2018   ESOPHAGOGASTRODUODENOSCOPY     KNEE ARTHROSCOPY WITH SUBCHONDROPLASTY Right 11/25/2019   Procedure: Right knee arthroscopic partial medial meniscectomy with subchondroplasty of the medial tibial plateau.;  Surgeon: Signa Kell, MD;  Location: [facility] ORS;  Service:  Orthopedics;  Laterality: Right;   OOPHORECTOMY Right    PART MASTECTOMY,RADIO FREQUENCY LOCALIZER,AXILLARY SENTINEL NODE BIOPSY Right 10/26/2022   Procedure: PART MASTECTOMY,RADIO FREQUENCY LOCALIZER,AXILLARY SENTINEL NODE BIOPSY;  Surgeon: Carolan Shiver, MD;  Location: [facility] ORS;  Service: General;  Laterality: Right;   SHOULDER SURGERY Left 2014   partial removal of AC joint  d/t osteoarthritis   TONSILLECTOMY     TOTAL KNEE ARTHROPLASTY Right 01/26/2022   Procedure: TOTAL KNEE ARTHROPLASTY - RNFA;  Surgeon: Christena Flake, MD;  Location: [facility] ORS;  Service: Orthopedics;  Laterality: Right;    SOCIAL HISTORY: Social History   Socioeconomic History   Marital status: Divorced    Spouse name: Not on file   Number of children: Not on file   Years of education: Not on file   Highest education level: Not on file  Occupational History   Not on file  Tobacco Use   Smoking status: Former    Current packs/day: 0.00    Average packs/day: 1.5 packs/day for 15.0 years (22.5 ttl pk-yrs)    Types: Cigarettes    Start date: 5    Quit date: 39    Years since quitting: 32.9   Smokeless tobacco: Never  Vaping Use   Vaping status: Never Used  Substance and Sexual Activity   Alcohol use: Yes    Alcohol/week: 7.0 standard drinks of alcohol    Types: 7 Shots of liquor per week    Comment: nightly   Drug use: No   Sexual activity: Not on file  Other Topics Concern   Not on file  Social History Narrative   Not on file   Social Drivers of Health   Financial Resource Strain: Low Risk  (12/24/2022)   Received from [facility]   Overall Financial Resource Strain (CARDIA)    Difficulty of Paying Living Expenses: Not hard at all  Food Insecurity: No Food Insecurity (12/24/2022)   Received from [facility]   Hunger Vital Sign    Worried About Running Out of Food in the Last Year: Never true    Ran Out of Food in the Last Year: Never true   Transportation Needs: No Transportation Needs (12/24/2022)   [facility] from [facility] - Transportation    In the past 12 months, has lack of transportation kept you from medical appointments or from getting medications?: No    Lack of Transportation (Non-Medical): No  Physical Activity: Not on file  Stress: Not on file  Social Connections: Not on file  Intimate Partner Violence: Not At Risk (01/26/2022)   Humiliation, Afraid, Rape, and Kick questionnaire    Fear of Current or Ex-Partner: No    Emotionally Abused: No    Physically Abused: No    Sexually Abused: No    FAMILY HISTORY: Family History  Problem Relation Age of Onset   Breast cancer Mother 45   Kidney cancer Mother 21   Lung cancer Father 29   Prostate cancer Father 65   Pancreatic cancer Cousin        dx 80s   Lymphoma Cousin     ALLERGIES:  is allergic to bee venom, hydrocodone, ivp dye [iodinated contrast media], penicillins, and septra [sulfamethoxazole-trimethoprim].  MEDICATIONS:  Current Outpatient Medications  Medication Sig Dispense Refill   acetaminophen (TYLENOL) 500 MG tablet Take 1-2 tablets (500-1,000 mg total) by mouth every 6 (six) hours as needed. (Patient taking differently: Take 1,000 mg by mouth 3 (three) times daily.) 30 tablet 0   amLODipine (NORVASC) 5 MG tablet Take 5 mg by mouth every morning.     anastrozole (ARIMIDEX) 1 MG tablet Take 1 tablet (1 mg total) by mouth daily. 60 tablet 0   calcium-vitamin D (OSCAL WITH D) 500-200 MG-UNIT TABS tablet Take 2 tablets by mouth daily. gummies  celecoxib (CELEBREX) 200 MG capsule Take by mouth.     hydrochlorothiazide (HYDRODIURIL) 25 MG tablet Take 25 mg by mouth every morning.     latanoprost (XALATAN) 0.005 % ophthalmic solution Place 1 drop into the right eye at bedtime.     lisinopril (PRINIVIL,ZESTRIL) 20 MG tablet Take 20 mg by mouth 2 (two) times daily.     Melatonin 5 MG TABS Take 5 mg by mouth at bedtime.       Multiple Vitamins-Minerals (CENTRUM SILVER 50+WOMEN) TABS Take 1 tablet by mouth daily.     omeprazole (PRILOSEC) 40 MG capsule Take 40 mg by mouth every morning.     ondansetron (ZOFRAN) 4 MG tablet Take 1 tablet (4 mg total) by mouth every 6 (six) hours as needed for nausea. 30 tablet 0   Propylene Glycol (SYSTANE COMPLETE) 0.6 % SOLN Place 1 drop into both eyes 3 (three) times daily.     simvastatin (ZOCOR) 10 MG tablet Take 10 mg by mouth at bedtime.      meloxicam (MOBIC) 7.5 MG tablet Take 7.5 mg by mouth 2 (two) times daily. (Patient not taking: Reported on 04/25/2023)     No current facility-administered medications for this visit.    REVIEW OF SYSTEMS:   Pertinent information mentioned in HPI All other systems were reviewed with the patient and are negative.  PHYSICAL EXAMINATION: ECOG PERFORMANCE STATUS: 0 - Asymptomatic  Vitals:   04/25/23 1006  BP: (!) 136/54  Pulse: 81  Resp: 18  Temp: 97.6 F (36.4 C)  SpO2: 100%   Filed Weights   04/25/23 1006  Weight: 173 lb 12.8 oz (78.8 kg)    GENERAL:alert, no distress and comfortable SKIN: skin color, texture, turgor are normal, no rashes or significant lesions EYES: normal, conjunctiva are pink and non-injected, sclera clear OROPHARYNX:no exudate, no erythema and lips, buccal mucosa, and tongue normal  NECK: supple, thyroid normal size, non-tender, without nodularity LYMPH:  no palpable lymphadenopathy in the cervical, axillary or inguinal LUNGS: clear to auscultation and percussion with normal breathing effort HEART: regular rate & rhythm and no murmurs and no lower extremity edema ABDOMEN:abdomen soft, non-tender and normal bowel sounds Musculoskeletal:no cyanosis of digits and no clubbing  PSYCH: alert & oriented x 3 with fluent speech NEURO: no focal motor/sensory deficits  LABORATORY DATA:  I have reviewed the data as listed Lab Results  Component Value Date   WBC 6.3 04/25/2023   HGB 11.9 (L) 04/25/2023    HCT 35.3 (L) 04/25/2023   MCV 92.9 04/25/2023   PLT 325 04/25/2023   Recent Labs    04/25/23 0951  NA 138  K 3.9  CL 102  CO2 27  GLUCOSE 95  BUN 25*  CREATININE 0.79  CALCIUM 9.3  GFRNONAA >60  PROT 7.3  ALBUMIN 4.0  AST 22  ALT 18  ALKPHOS 62  BILITOT 0.4     RADIOGRAPHIC STUDIES: I have personally reviewed the radiological images as listed and agreed with the findings in the report. No results found.

## 2023-04-25 NOTE — Progress Notes (Signed)
Patient states that since taking the Letrozole 2.5 mg started taking on 01/24/2023, she gets way too drowsie, so she would like to change up that medication. Starting last week a dull achy feeling in her right thumb which is starting to bother her. Inside lining of her right cheek is bothering her when eating harder foods.

## 2023-05-25 ENCOUNTER — Other Ambulatory Visit: Payer: Self-pay | Admitting: Internal Medicine

## 2023-05-25 ENCOUNTER — Other Ambulatory Visit: Payer: Self-pay

## 2023-05-25 DIAGNOSIS — Z170 Estrogen receptor positive status [ER+]: Secondary | ICD-10-CM

## 2023-06-02 ENCOUNTER — Encounter: Payer: Self-pay | Admitting: Podiatry

## 2023-06-02 ENCOUNTER — Ambulatory Visit: Payer: Medicare HMO | Admitting: Podiatry

## 2023-06-02 DIAGNOSIS — B351 Tinea unguium: Secondary | ICD-10-CM | POA: Diagnosis not present

## 2023-06-02 DIAGNOSIS — M79674 Pain in right toe(s): Secondary | ICD-10-CM

## 2023-06-02 DIAGNOSIS — M79675 Pain in left toe(s): Secondary | ICD-10-CM | POA: Diagnosis not present

## 2023-06-02 NOTE — Progress Notes (Signed)
   Chief Complaint  Patient presents with   Nail Problem    "I have a bad toenail.  I think I trimmed it down too far.  It's sore." N - painful toenail L - 2nd digit left D - 1 month O - suddenly, gotten worse C - thick, sore, ingrown A - socks that are too tight on toe, shoes sometime T - none    SUBJECTIVE Patient presents to office today complaining of elongated, thickened nails that cause pain while ambulating in shoes.  Patient is unable to trim their own nails. Patient is here for further evaluation and treatment.  Past Medical History:  Diagnosis Date   Breast cancer (HCC) 10/18/2022   Cervical cancer (HCC) 1988   Degenerative disc disease, thoracic    Dry eye syndrome    Family history of adverse reaction to anesthesia    dad-hard to intubate   GERD (gastroesophageal reflux disease)    Glaucoma    Headache    h/o migraines   Hyperlipidemia    Hypertension    PAT (paroxysmal atrial tachycardia) (HCC)    Skin cancer    Neck    Allergies  Allergen Reactions   Bee Venom Swelling   Hydrocodone Other (See Comments)    Pt can only take half of 5 mg hydrocodone and then 2 hours later the other half   Ivp Dye [Iodinated Contrast Media] Rash   Penicillins Rash   Septra [Sulfamethoxazole-Trimethoprim] Rash     OBJECTIVE General Patient is awake, alert, and oriented x 3 and in no acute distress. Derm Skin is dry and supple bilateral. Negative open lesions or macerations. Remaining integument unremarkable. Nails are tender, long, thickened and dystrophic with subungual debris, consistent with onychomycosis, 1-5 bilateral. No signs of infection noted. Vasc  DP and PT pedal pulses palpable bilaterally. Temperature gradient within normal limits.  Neuro Epicritic and protective threshold sensation grossly intact bilaterally.  Musculoskeletal Exam No symptomatic pedal deformities noted bilateral. Muscular strength within normal limits.  ASSESSMENT 1.  Pain due to  onychomycosis of toenails both  PLAN OF CARE 1. Patient evaluated today.  2. Instructed to maintain good pedal hygiene and foot care.  3. Mechanical debridement of nails 1-5 bilaterally performed using a nail nipper. Filed with dremel without incident.  4. Return to clinic in 3 mos.    Felecia Shelling, DPM [facility]  Dr. Felecia Shelling, DPM    [address] [place], [place] [address]                Office [phone]  Fax [phone]

## 2023-06-07 ENCOUNTER — Ambulatory Visit: Payer: Self-pay | Admitting: Podiatry

## 2023-06-13 ENCOUNTER — Ambulatory Visit: Payer: Medicare HMO | Admitting: Podiatry

## 2023-06-18 ENCOUNTER — Other Ambulatory Visit: Payer: Self-pay | Admitting: Internal Medicine

## 2023-06-20 ENCOUNTER — Encounter: Payer: Self-pay | Admitting: Internal Medicine

## 2023-06-20 ENCOUNTER — Inpatient Hospital Stay: Payer: Medicare HMO | Attending: Internal Medicine | Admitting: Internal Medicine

## 2023-06-20 ENCOUNTER — Other Ambulatory Visit: Payer: Medicare Other

## 2023-06-20 VITALS — BP 128/60 | HR 71 | Temp 98.1°F | Resp 18 | Wt 179.0 lb

## 2023-06-20 DIAGNOSIS — Z803 Family history of malignant neoplasm of breast: Secondary | ICD-10-CM | POA: Insufficient documentation

## 2023-06-20 DIAGNOSIS — C50411 Malignant neoplasm of upper-outer quadrant of right female breast: Secondary | ICD-10-CM

## 2023-06-20 DIAGNOSIS — C50811 Malignant neoplasm of overlapping sites of right female breast: Secondary | ICD-10-CM | POA: Insufficient documentation

## 2023-06-20 DIAGNOSIS — Z8541 Personal history of malignant neoplasm of cervix uteri: Secondary | ICD-10-CM | POA: Insufficient documentation

## 2023-06-20 DIAGNOSIS — Z170 Estrogen receptor positive status [ER+]: Secondary | ICD-10-CM | POA: Diagnosis not present

## 2023-06-20 DIAGNOSIS — M8589 Other specified disorders of bone density and structure, multiple sites: Secondary | ICD-10-CM | POA: Insufficient documentation

## 2023-06-20 DIAGNOSIS — Z79811 Long term (current) use of aromatase inhibitors: Secondary | ICD-10-CM

## 2023-06-20 NOTE — Progress Notes (Signed)
[facility] CONSULT NOTE  Patient Care Team: Zada Finders Joycie Peek, MD as PCP - General (Family Medicine) Hulen Luster, RN as Oncology Nurse Navigator Michaelyn Barter, MD as Consulting Physician (Oncology) Carmina Miller, MD as Consulting Physician (Radiation Oncology) Carolan Shiver, MD as Consulting Physician (General Surgery)  REFERRING PROVIDER: Dr. Zada Finders  REASON FOR REFFERAL: Right breast cancer  CANCER STAGING   Cancer Staging  Breast cancer [facility]) Staging form: Breast, AJCC 8th Edition - Clinical stage from 10/11/2022: Stage IA (cT1b, cN0, cM0, G1, ER+, PR+, HER2-) - Signed by Michaelyn Barter, MD on 11/17/2022 Stage prefix: Initial diagnosis Histologic grading system: 3 grade system - Pathologic stage from 10/26/2022: Stage IA (pT1b, pN0(sn), cM0, G2, ER+, PR+, HER2-, Oncotype DX score: 16) - Signed by Michaelyn Barter, MD on 11/17/2022 Stage prefix: Initial diagnosis Method of lymph node assessment: Sentinel lymph node biopsy Multigene prognostic tests performed: Oncotype DX Recurrence score range: Greater than or equal to 11 Histologic grading system: 3 grade system   ASSESSMENT & PLAN:  Megan Montgomery 78 y.o. 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  # Right breast IDC, Stage IA ER/PR+, HER2- -detected on a screening mammogram. Diagnostic mammogram and ultrasound from 09/30/2022 showed 9 x 6 x 9 mm spiculated hypoechoic mass at 330 o'clock position 5 cm from the nipple.  No suspicious lymph nodes.  Status post biopsy showed IDC, 7 mm in size, overall grade 1, LVI negative, ER more than 90%, PR 10 to 20%, HER2 equivocal by IHC and negative by FISH.  -s/p right breast lumpectomy with SLNB by Dr. Maia Plan. Pathology showed IDC, with component of DCIS, 0/1 lymph node negative for malignancy.  Tumor  measuring 10 mm, overall grade 2, LVI negative, margins negative.    - Oncotype DX score was 16 so there is no benefit of adjuvant chemotherapy.    - Completed adjuvant RT from 12/15/2022 through 01/10/2023  -Started letrozole on 01/25/2023.  Since starting letrozole, she has been noticing more drowsiness and fatigue. Compromising her quality of life.  It is making it difficult for her to drive.  Reports this all started after letrozole.   -Switched to anastrozole on 04/25/2023.  She still feels fatigued but not the drowsiness especially during driving.  She is agreeable to continue with anastrozole for now.  If she has any issues, she will reach out to [place] consider switching to exemestane if bothersome.  Not keen on tamoxifen.  I will follow-up in 3 months after her annual diagnostic mammogram.  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  Has previously taken Boniva.  She is taking calcium vitamin D Gummies.  Repeat bone density scan in September 2025  # Family history of breast cancer -She has family history of breast cancer and kidney cancer in mother.  Father lung and prostate cancer. -Invitae gene panel negative  # History of cervical cancer -Status post surgery in 1988  # Hyperlipidemia -Simvastatin  RTC in 3 months for MD visit, labs, discuss mammo.  Orders Placed This Encounter  Procedures   MM DIAG BREAST TOMO BILATERAL    Standing Status:   Future    Expected Date:   09/11/2023    Expiration Date:   06/19/2024    Reason for Exam (SYMPTOM  OR DIAGNOSIS REQUIRED):  breast cancer    Preferred imaging location?:   [facility]   US Breast Limited Uni Left Inc Axilla    Standing Status:   Future    Expected Date:   09/11/2023    Expiration Date:   06/19/2024    Reason for Exam (SYMPTOM  OR DIAGNOSIS REQUIRED):   breast cancer    Preferred Imaging Location?:   [facility]   US Breast Limited Uni Right Inc Axilla     Standing Status:   Future    Expected Date:   09/11/2023    Expiration Date:   06/19/2024    Reason for Exam (SYMPTOM  OR DIAGNOSIS REQUIRED):   breast cancer    Preferred Imaging Location?:   [facility]   CBC with Differential (Cancer Center Only)    Standing Status:   Future    Expected Date:   09/17/2023    Expiration Date:   06/19/2024   CMP (Cancer Center only)    Standing Status:   Future    Expected Date:   09/17/2023    Expiration Date:   06/19/2024    The total time spent in the appointment was 30 minutes encounter with patients including review of chart and various tests results, discussions about plan of care and coordination of care plan   All questions were answered. The patient knows to call the clinic with any problems, questions or concerns. No barriers to learning was detected.  Michaelyn Barter, MD 2/11/202512:08 PM   HISTORY OF PRESENTING ILLNESS:  Megan Montgomery 78 y.o. 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  Interval history Patient was seen today as follow-up for anastrozole toxicity check. She has been taking it for few months.  Continues to feel fatigue however not the drowsiness that she was experiencing while on letrozole specially during driving.  At this time, she feels comfortable continuing with anastrozole give a little more time and see how she tolerates.  Does report sensation of discomfort in her right breast which started after the surgery.  Primarily in the mornings when she wakes up.  She has been using sports bra which is helping but changing daily difficult.  I have reviewed her chart and materials related to her cancer extensively and collaborated history with the patient. Summary of oncologic history is as follows: Oncology History  Breast cancer (HCC)  09/26/2022 Mammogram    Screening mammogram FINDINGS: In the right breast, a possible mass warrants further evaluation. In the left breast, no findings suspicious for malignancy.   IMPRESSION: Further evaluation is suggested for a possible mass in the right breast.  Diagnostic mammogram and ultrasound FINDINGS: Spot tomosynthesis views of the right breast demonstrate a 10 mm spiculated mass in the inner central right breast middle depth (spot CC image 29/43, spot MLO image 25/48). This corresponds with the mass seen on screening mammogram.   Targeted right breast ultrasound was performed. At 3:30 o'clock 5 cm from the nipple, there is a spiculated hypoechoic mass that measures 9 x 6 x 9 mm. There is no definite internal vascularity. This corresponds with the spiculated mass seen on mammogram.   Targeted right axillary ultrasound demonstrates multiple morphologically benign lymph nodes. No lymphadenopathy.   IMPRESSION: Right breast 9 mm spiculated mass in the 3:30 o'clock position is concerning for malignancy. Recommend further assessment with ultrasound-guided biopsy  10/11/2022 Pathology Results   DIAGNOSIS:  A. BREAST, RIGHT 3:30 5 CMFN; ULTRASOUND-GUIDED BIOPSY:  - INVASIVE MAMMARY CARCINOMA, NO SPECIAL TYPE.  Size of invasive carcinoma: 7 mm in this sample  Histologic grade of invasive carcinoma: Grade 1                       Glandular/tubular differentiation score: 2                       Nuclear pleomorphism score: 2                       Mitotic rate score: 1                       Total score: 5  Ductal carcinoma in situ: Present, intermediate grade  Lymphovascular invasion: Not identified   ADDENDUM:  CASE SUMMARY: BREAST BIOMARKER TESTS  Estrogen Receptor (ER) Status: POSITIVE          Percentage of cells with nuclear positivity: Greater than 90%          Average intensity of staining: Strong   Progesterone Receptor (PgR) Status: POSITIVE          Percentage of cells with nuclear  positivity: 10-20%          Average intensity of staining: Moderate   HER2 (by immunohistochemistry): EQUIVOCAL (Score 2+)   CASE SUMMARY: BREAST BIOMARKER TESTS TEST(S) PERFORMED: HER2 (by in situ hybridization): NEGATIVE, Group 5 Number of observers: 2 Number of invasive tumor cells counted: 40 Dual probe assay    Average number of HER2 signals per cell: 2.0    Average number of CEP17 signals per cell: 1.7    HER2/CEP17 ratio: 1.18     10/11/2022 Cancer Staging   Staging form: Breast, AJCC 8th Edition - Clinical stage from 10/11/2022: Stage IA (cT1b, cN0, cM0, G1, ER+, PR+, HER2-) - Signed by Michaelyn Barter, MD on 11/17/2022 Stage prefix: Initial diagnosis Histologic grading system: 3 grade system   10/26/2022 Cancer Staging   Staging form: Breast, AJCC 8th Edition - Pathologic stage from 10/26/2022: Stage IA (pT1b, pN0(sn), cM0, G2, ER+, PR+, HER2-, Oncotype DX score: 16) - Signed by Michaelyn Barter, MD on 11/17/2022 Stage prefix: Initial diagnosis Method of lymph node assessment: Sentinel lymph node biopsy Multigene prognostic tests performed: Oncotype DX Recurrence score range: Greater than or equal to 11 Histologic grading system: 3 grade system    Menarche age 69 Children 2 Age at first birth 67 Birth control yes OCP more than 5 years Hysterectomy 1988 took 1 ovary out for cervical cancer HRT yes for about a year History of breast biopsies yes 25 years ago a cyst which was aspirated  Family history- Mother-breast cancer and kidney cancer Father-lung and prostate cancer Maternal cousin-lymphoma  MEDICAL HISTORY:  Past Medical History:  Diagnosis Date   Breast cancer (HCC) 10/18/2022   Cervical cancer (HCC) 1988   Degenerative disc disease, thoracic    Dry eye syndrome    Family history of adverse reaction to anesthesia    dad-hard to intubate   GERD (gastroesophageal reflux disease)    Glaucoma    Headache    h/o migraines   Hyperlipidemia    Hypertension     PAT (paroxysmal atrial tachycardia) (HCC)    Skin cancer    Neck    SURGICAL HISTORY: Past Surgical History:  Procedure Laterality Date  ABDOMINAL HYSTERECTOMY     BREAST BIOPSY Right    neg bx/clip   BREAST BIOPSY Right 10/11/2022   u/s bx, 3:30 RIBBON clip-path pendinf   BREAST BIOPSY Right 10/11/2022   [place] RT BREAST BX W LOC DEV 1ST LESION IMG BX SPEC US GUIDE 10/11/2022 [facility]   CATARACT EXTRACTION W/ INTRAOCULAR LENS  IMPLANT, BILATERAL Bilateral    COLONOSCOPY  2018   ESOPHAGOGASTRODUODENOSCOPY     KNEE ARTHROSCOPY WITH SUBCHONDROPLASTY Right 11/25/2019   Procedure: Right knee arthroscopic partial medial meniscectomy with subchondroplasty of the medial tibial plateau.;  Surgeon: Signa Kell, MD;  Location: [facility] ORS;  Service: Orthopedics;  Laterality: Right;   OOPHORECTOMY Right    PART MASTECTOMY,RADIO FREQUENCY LOCALIZER,AXILLARY SENTINEL NODE BIOPSY Right 10/26/2022   Procedure: PART MASTECTOMY,RADIO FREQUENCY LOCALIZER,AXILLARY SENTINEL NODE BIOPSY;  Surgeon: Carolan Shiver, MD;  Location: [facility] ORS;  Service: General;  Laterality: Right;   SHOULDER SURGERY Left 2014   partial removal of AC joint d/t osteoarthritis   TONSILLECTOMY     TOTAL KNEE ARTHROPLASTY Right 01/26/2022   Procedure: TOTAL KNEE ARTHROPLASTY - RNFA;  Surgeon: Christena Flake, MD;  Location: [facility] ORS;  Service: Orthopedics;  Laterality: Right;    SOCIAL HISTORY: Social History   Socioeconomic History   Marital status: Divorced    Spouse name: Not on file   Number of children: Not on file   Years of education: Not on file   Highest education level: Not on file  Occupational History   Not on file  Tobacco Use   Smoking status: Former    Current packs/day: 0.00    Average packs/day: 1.5 packs/day for 15.0 years (22.5 ttl pk-yrs)    Types: Cigarettes    Start date: 17    Quit date: 61    Years since quitting: 33.1   Smokeless tobacco: Never  Vaping Use   Vaping status: Never  Used  Substance and Sexual Activity   Alcohol use: Yes    Alcohol/week: 7.0 standard drinks of alcohol    Types: 7 Shots of liquor per week    Comment: nightly   Drug use: No   Sexual activity: Not on file  Other Topics Concern   Not on file  Social History Narrative   Not on file   Social Drivers of Health   Financial Resource Strain: Low Risk  (06/09/2023)   Received from [facility]   Overall Financial Resource Strain (CARDIA)    Difficulty of Paying Living Expenses: Not hard at all  Food Insecurity: No Food Insecurity (06/09/2023)   Received from [facility]   Hunger Vital Sign    Worried About Running Out of Food in the Last Year: Never true    Ran Out of Food in the Last Year: Never true  Transportation Needs: No Transportation Needs (06/09/2023)   Received from [facility] - Transportation    In the past 12 months, has lack of transportation kept you from medical appointments or from getting medications?: No    Lack of Transportation (Non-Medical): No  Physical Activity: Not on file  Stress: Not on file  Social Connections: Not on file  Intimate Partner Violence: Not At Risk (01/26/2022)   Humiliation, Afraid, Rape, and Kick questionnaire    Fear of Current or Ex-Partner: No    Emotionally Abused: No    Physically Abused: No    Sexually Abused: No    FAMILY HISTORY: Family History  Problem  Relation Age of Onset   Breast cancer Mother 34   Kidney cancer Mother 84   Lung cancer Father 75   Prostate cancer Father 22   Pancreatic cancer Cousin        dx 49s   Lymphoma Cousin     ALLERGIES:  is allergic to bee venom, hydrocodone, ivp dye [iodinated contrast media], penicillins, and septra [sulfamethoxazole-trimethoprim].  MEDICATIONS:  Current Outpatient Medications  Medication Sig Dispense Refill   acetaminophen (TYLENOL) 500 MG tablet Take 1-2 tablets (500-1,000 mg total) by mouth every 6 (six)  hours as needed. (Patient taking differently: Take 1,000 mg by mouth 3 (three) times daily.) 30 tablet 0   amLODipine (NORVASC) 5 MG tablet Take 5 mg by mouth every morning.     anastrozole (ARIMIDEX) 1 MG tablet TAKE 1 TABLET BY MOUTH EVERY DAY 90 tablet 1   calcium-vitamin D (OSCAL WITH D) 500-200 MG-UNIT TABS tablet Take 2 tablets by mouth daily. gummies     celecoxib (CELEBREX) 200 MG capsule Take 200 mg by mouth daily.     hydrochlorothiazide (HYDRODIURIL) 25 MG tablet Take 25 mg by mouth every morning.     latanoprost (XALATAN) 0.005 % ophthalmic solution Place 1 drop into the right eye at bedtime.     lisinopril (PRINIVIL,ZESTRIL) 20 MG tablet Take 20 mg by mouth 2 (two) times daily.     Melatonin 5 MG TABS Take 5 mg by mouth at bedtime.      Misc Natural Products (NEURIVA) CHEW Chew by mouth. Takes one a day     Multiple Vitamins-Minerals (CENTRUM SILVER 50+WOMEN) TABS Take 1 tablet by mouth daily.     omeprazole (PRILOSEC) 40 MG capsule Take 40 mg by mouth every morning.     ondansetron (ZOFRAN) 4 MG tablet Take 1 tablet (4 mg total) by mouth every 6 (six) hours as needed for nausea. 30 tablet 0   Propylene Glycol (SYSTANE COMPLETE) 0.6 % SOLN Place 1 drop into both eyes 3 (three) times daily.     simvastatin (ZOCOR) 10 MG tablet Take 10 mg by mouth at bedtime.      No current facility-administered medications for this visit.    REVIEW OF SYSTEMS:   Pertinent information mentioned in HPI All other systems were reviewed with the patient and are negative.  PHYSICAL EXAMINATION: ECOG PERFORMANCE STATUS: 0 - Asymptomatic  Vitals:   06/20/23 1039  BP: 128/60  Pulse: 71  Resp: 18  Temp: 98.1 F (36.7 C)  SpO2: 100%   Filed Weights   06/20/23 1039  Weight: 179 lb (81.2 kg)    GENERAL:alert, no distress and comfortable SKIN: skin color, texture, turgor are normal, no rashes or significant lesions EYES: normal, conjunctiva are pink and non-injected, sclera  clear OROPHARYNX:no exudate, no erythema and lips, buccal mucosa, and tongue normal  NECK: supple, thyroid normal size, non-tender, without nodularity LYMPH:  no palpable lymphadenopathy in the cervical, axillary or inguinal LUNGS: clear to auscultation and percussion with normal breathing effort HEART: regular rate & rhythm and no murmurs and no lower extremity edema ABDOMEN:abdomen soft, non-tender and normal bowel sounds Musculoskeletal:no cyanosis of digits and no clubbing  PSYCH: alert & oriented x 3 with fluent speech NEURO: no focal motor/sensory deficits  LABORATORY DATA:  I have reviewed the data as listed Lab Results  Component Value Date   WBC 6.3 04/25/2023   HGB 11.9 (L) 04/25/2023   HCT 35.3 (L) 04/25/2023   MCV 92.9 04/25/2023   PLT 325  04/25/2023   Recent Labs    04/25/23 0951  NA 138  K 3.9  CL 102  CO2 27  GLUCOSE 95  BUN 25*  CREATININE 0.79  CALCIUM 9.3  GFRNONAA >60  PROT 7.3  ALBUMIN 4.0  AST 22  ALT 18  ALKPHOS 62  BILITOT 0.4     RADIOGRAPHIC STUDIES: I have personally reviewed the radiological images as listed and agreed with the findings in the report. No results found.

## 2023-06-20 NOTE — Progress Notes (Signed)
Patient gets this chill feeling that radiates down from the back of her neck down to her feet. She said that she got a phone call saying that she needs to update a mammogram but it needs to be a diagnostic mammogram in May. Since she had her mastectomy she has to sleep in her sports bra due to how uncomfortable it is trying to sleep on her right side.

## 2023-08-17 ENCOUNTER — Ambulatory Visit
Admission: RE | Admit: 2023-08-17 | Discharge: 2023-08-17 | Disposition: A | Discharge status: Home / Self Care | Payer: Medicare Other | Source: Ambulatory Visit | Attending: Radiation Oncology | Admitting: Radiation Oncology

## 2023-08-17 ENCOUNTER — Encounter: Payer: Self-pay | Admitting: Radiation Oncology

## 2023-08-17 VITALS — BP 141/71 | HR 84 | Temp 97.9°F | Resp 16 | Ht 66.0 in | Wt 178.0 lb

## 2023-08-17 DIAGNOSIS — Z170 Estrogen receptor positive status [ER+]: Secondary | ICD-10-CM | POA: Diagnosis not present

## 2023-08-17 DIAGNOSIS — Z923 Personal history of irradiation: Secondary | ICD-10-CM | POA: Insufficient documentation

## 2023-08-17 DIAGNOSIS — C50311 Malignant neoplasm of lower-inner quadrant of right female breast: Secondary | ICD-10-CM | POA: Insufficient documentation

## 2023-08-17 DIAGNOSIS — Z79811 Long term (current) use of aromatase inhibitors: Secondary | ICD-10-CM | POA: Insufficient documentation

## 2023-08-17 NOTE — Progress Notes (Signed)
 Radiation Oncology Follow up Note  Name: Megan Montgomery   Date:   08/17/2023 MRN:  [id number] DOB: Jun 02, 1945    This 78 y.o. female presents to the clinic today for 36-month follow-up status post whole breast radiation to her right breast for stage Ia ER/PR positive invasive mammary carcinoma.  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  Seen today in routine follow-up she is doing well still has occasional tenderness in her right axilla which I explained is most likely rated to scar tissue.  She otherwise specifically specifically denies breast tenderness cough or bone pain..  She is scheduled for mammograms next month.  She is currently on Arimidex tolerating it well without side effects.  COMPLICATIONS OF TREATMENT: none  FOLLOW UP COMPLIANCE: keeps appointments   PHYSICAL EXAM:  BP (!) 141/71   Pulse 84   Temp 97.9 F (36.6 C) (Tympanic)   Resp 16   Ht 5\' 6"  (1.676 m)   Wt 178 lb (80.7 kg)   BMI 28.73 kg/m  Lungs are clear to A&P cardiac examination essentially unremarkable with regular rate and rhythm. No dominant mass or nodularity is noted in either breast in 2 positions examined. Incision is well-healed. No axillary or supraclavicular adenopathy is appreciated. Cosmetic result is excellent.  Well-developed well-nourished patient in NAD. HEENT reveals PERLA, EOMI, discs not visualized.  Oral cavity is clear. No oral mucosal lesions are identified. Neck is clear without evidence of cervical or supraclavicular adenopathy. Lungs are clear to A&P. Cardiac examination is essentially unremarkable with regular rate and rhythm without murmur rub or thrill. Abdomen is benign with no organomegaly or masses noted. Motor sensory and DTR levels are equal and symmetric in the upper and lower extremities. Cranial nerves II through XII  are grossly intact. Proprioception is intact. No peripheral adenopathy or edema is identified. No motor or sensory levels are noted. Crude visual fields are within normal range.  RADIOLOGY RESULTS: No current films to review  PLAN: The present time patient is doing well 7 months out from whole breast radiation and pleased with her overall progress.  She continues on Arimidex.  I have asked to see her back in 6 months for follow-up.  Patient knows to call with any concerns.  I would like to take this opportunity to thank you for allowing me to participate in the care of your patient.Carmina Miller, MD

## 2023-09-27 ENCOUNTER — Ambulatory Visit
Admission: RE | Admit: 2023-09-27 | Discharge: 2023-09-27 | Disposition: A | Discharge status: Home / Self Care | Payer: Medicare HMO | Source: Ambulatory Visit | Attending: Internal Medicine | Admitting: Internal Medicine

## 2023-09-27 DIAGNOSIS — C50411 Malignant neoplasm of upper-outer quadrant of right female breast: Secondary | ICD-10-CM | POA: Insufficient documentation

## 2023-09-27 DIAGNOSIS — Z170 Estrogen receptor positive status [ER+]: Secondary | ICD-10-CM

## 2023-09-27 HISTORY — DX: Personal history of irradiation: Z92.3

## 2023-10-03 ENCOUNTER — Ambulatory Visit: Payer: Medicare HMO | Admitting: Oncology

## 2023-10-03 ENCOUNTER — Inpatient Hospital Stay: Attending: Radiation Oncology

## 2023-10-03 ENCOUNTER — Ambulatory Visit: Payer: Medicare HMO | Admitting: Internal Medicine

## 2023-10-03 ENCOUNTER — Other Ambulatory Visit: Payer: Medicare HMO

## 2023-10-03 ENCOUNTER — Inpatient Hospital Stay: Admitting: Oncology

## 2023-10-03 ENCOUNTER — Encounter: Payer: Self-pay | Admitting: Oncology

## 2023-10-03 VITALS — BP 148/68 | HR 78 | Temp 98.4°F | Resp 12 | Ht 66.0 in | Wt 177.6 lb

## 2023-10-03 DIAGNOSIS — C50811 Malignant neoplasm of overlapping sites of right female breast: Secondary | ICD-10-CM

## 2023-10-03 DIAGNOSIS — Z803 Family history of malignant neoplasm of breast: Secondary | ICD-10-CM | POA: Insufficient documentation

## 2023-10-03 DIAGNOSIS — Z8541 Personal history of malignant neoplasm of cervix uteri: Secondary | ICD-10-CM | POA: Diagnosis not present

## 2023-10-03 DIAGNOSIS — Z08 Encounter for follow-up examination after completed treatment for malignant neoplasm: Secondary | ICD-10-CM

## 2023-10-03 DIAGNOSIS — Z87891 Personal history of nicotine dependence: Secondary | ICD-10-CM | POA: Diagnosis not present

## 2023-10-03 DIAGNOSIS — Z5181 Encounter for therapeutic drug level monitoring: Secondary | ICD-10-CM

## 2023-10-03 DIAGNOSIS — Z170 Estrogen receptor positive status [ER+]: Secondary | ICD-10-CM

## 2023-10-03 DIAGNOSIS — Z800 Family history of malignant neoplasm of digestive organs: Secondary | ICD-10-CM | POA: Diagnosis not present

## 2023-10-03 DIAGNOSIS — M85851 Other specified disorders of bone density and structure, right thigh: Secondary | ICD-10-CM

## 2023-10-03 DIAGNOSIS — Z801 Family history of malignant neoplasm of trachea, bronchus and lung: Secondary | ICD-10-CM | POA: Insufficient documentation

## 2023-10-03 DIAGNOSIS — Z79811 Long term (current) use of aromatase inhibitors: Secondary | ICD-10-CM | POA: Insufficient documentation

## 2023-10-03 DIAGNOSIS — Z9189 Other specified personal risk factors, not elsewhere classified: Secondary | ICD-10-CM

## 2023-10-03 DIAGNOSIS — Z79899 Other long term (current) drug therapy: Secondary | ICD-10-CM

## 2023-10-03 LAB — CBC WITH DIFFERENTIAL (CANCER CENTER ONLY)
Abs Immature Granulocytes: 0.03 10*3/uL (ref 0.00–0.07)
Basophils Absolute: 0.1 10*3/uL (ref 0.0–0.1)
Basophils Relative: 1 %
Eosinophils Absolute: 0.1 10*3/uL (ref 0.0–0.5)
Eosinophils Relative: 1 %
HCT: 34.5 % — ABNORMAL LOW (ref 36.0–46.0)
Hemoglobin: 11.7 g/dL — ABNORMAL LOW (ref 12.0–15.0)
Immature Granulocytes: 0 %
Lymphocytes Relative: 25 %
Lymphs Abs: 1.8 10*3/uL (ref 0.7–4.0)
MCH: 30.7 pg (ref 26.0–34.0)
MCHC: 33.9 g/dL (ref 30.0–36.0)
MCV: 90.6 fL (ref 80.0–100.0)
Monocytes Absolute: 0.5 10*3/uL (ref 0.1–1.0)
Monocytes Relative: 7 %
Neutro Abs: 4.8 10*3/uL (ref 1.7–7.7)
Neutrophils Relative %: 66 %
Platelet Count: 343 10*3/uL (ref 150–400)
RBC: 3.81 MIL/uL — ABNORMAL LOW (ref 3.87–5.11)
RDW: 13.4 % (ref 11.5–15.5)
WBC Count: 7.3 10*3/uL (ref 4.0–10.5)
nRBC: 0 % (ref 0.0–0.2)

## 2023-10-03 LAB — CMP (CANCER CENTER ONLY)
ALT: 14 U/L (ref 0–44)
AST: 19 U/L (ref 15–41)
Albumin: 4.2 g/dL (ref 3.5–5.0)
Alkaline Phosphatase: 80 U/L (ref 38–126)
Anion gap: 9 (ref 5–15)
BUN: 22 mg/dL (ref 8–23)
CO2: 27 mmol/L (ref 22–32)
Calcium: 8.9 mg/dL (ref 8.9–10.3)
Chloride: 98 mmol/L (ref 98–111)
Creatinine: 0.78 mg/dL (ref 0.44–1.00)
GFR, Estimated: >60 mL/min (ref >60)
Glucose, Bld: 114 mg/dL — ABNORMAL HIGH (ref 70–99)
Potassium: 3.6 mmol/L (ref 3.5–5.1)
Sodium: 134 mmol/L — ABNORMAL LOW (ref 135–145)
Total Bilirubin: 0.5 mg/dL (ref 0.0–1.2)
Total Protein: 7.3 g/dL (ref 6.5–8.1)

## 2023-10-03 NOTE — Progress Notes (Unsigned)
 Mammogram 09/27/23.  C/o feeling hot at bedtime x2 months, thinking it may ne the anastrozole .

## 2023-10-04 NOTE — Progress Notes (Signed)
 Hematology/Oncology Consult note [facility]  [phone] [phone]  Patient Care Team: Baltazar Leventhal, MD as PCP - General (Family Medicine) Waverly Hageman, RN as Oncology Nurse Navigator Loreatha Rodney, MD as Consulting Physician (Oncology) Glenis Langdon, MD as Consulting Physician (Radiation Oncology) Eldred Grego, MD as Consulting Physician (General Surgery)   Name of the patient: Megan Montgomery  [id number]  04-20-1946   Date of visit: 10/04/23  Diagnosis-  Cancer Staging  Breast cancer [facility]) Staging form: Breast, AJCC 8th Edition - Clinical stage from 10/11/2022: Stage IA (cT1b, cN0, cM0, G1, ER+, PR+, HER2-) - Signed by Agrawal, Kavita, MD on 11/17/2022 Stage prefix: Initial diagnosis Histologic grading system: 3 grade system - Pathologic stage from 10/26/2022: Stage IA (pT1b, pN0(sn), cM0, G2, ER+, PR+, HER2-, Oncotype DX score: 16) - Signed by Agrawal, Kavita, MD on 11/17/2022 Stage prefix: Initial diagnosis Method of lymph node assessment: Sentinel lymph node biopsy Multigene prognostic tests performed: Oncotype DX Recurrence score range: Greater than or equal to 11 Histologic grading system: 3 grade system    Chief complaint/ Reason for visit- Routine follow-up of breast cancer  Heme/Onc history:  Oncology History  Breast cancer (HCC)  09/26/2022 Mammogram   Screening mammogram FINDINGS: In the right breast, a possible mass warrants further evaluation. In the left breast, no findings suspicious for malignancy.   IMPRESSION: Further evaluation is suggested for a possible mass in the right breast.  Diagnostic mammogram and ultrasound FINDINGS: Spot tomosynthesis views of the right breast demonstrate a 10 mm spiculated mass in the inner central right breast middle depth (spot CC image 29/43, spot MLO image 25/48). This corresponds with the mass seen on screening mammogram.   Targeted right breast  ultrasound was performed. At 3:30 o'clock 5 cm from the nipple, there is a spiculated hypoechoic mass that measures 9 x 6 x 9 mm. There is no definite internal vascularity. This corresponds with the spiculated mass seen on mammogram.   Targeted right axillary ultrasound demonstrates multiple morphologically benign lymph nodes. No lymphadenopathy.   IMPRESSION: Right breast 9 mm spiculated mass in the 3:30 o'clock position is concerning for malignancy. Recommend further assessment with ultrasound-guided biopsy   10/11/2022 Pathology Results   DIAGNOSIS:  A. BREAST, RIGHT 3:30 5 CMFN; ULTRASOUND-GUIDED BIOPSY:  - INVASIVE MAMMARY CARCINOMA, NO SPECIAL TYPE.  Size of invasive carcinoma: 7 mm in this sample  Histologic grade of invasive carcinoma: Grade 1                       Glandular/tubular differentiation score: 2                       Nuclear pleomorphism score: 2                       Mitotic rate score: 1                       Total score: 5  Ductal carcinoma in situ: Present, intermediate grade  Lymphovascular invasion: Not identified   ADDENDUM:  CASE SUMMARY: BREAST BIOMARKER TESTS  Estrogen Receptor (ER) Status: POSITIVE          Percentage of cells with nuclear positivity: Greater than 90%          Average intensity of staining: Strong   Progesterone Receptor (PgR) Status: POSITIVE  Percentage of cells with nuclear positivity: 10-20%          Average intensity of staining: Moderate   HER2 (by immunohistochemistry): EQUIVOCAL (Score 2+)   CASE SUMMARY: BREAST BIOMARKER TESTS TEST(S) PERFORMED: HER2 (by in situ hybridization): NEGATIVE, Group 5 Number of observers: 2 Number of invasive tumor cells counted: 40 Dual probe assay    Average number of HER2 signals per cell: 2.0    Average number of CEP17 signals per cell: 1.7    HER2/CEP17 ratio: 1.18     10/11/2022 Cancer Staging   Staging form: Breast, AJCC 8th Edition - Clinical stage from 10/11/2022: Stage  IA (cT1b, cN0, cM0, G1, ER+, PR+, HER2-) - Signed by Agrawal, Kavita, MD on 11/17/2022 Stage prefix: Initial diagnosis Histologic grading system: 3 grade system   10/26/2022 Cancer Staging   Staging form: Breast, AJCC 8th Edition - Pathologic stage from 10/26/2022: Stage IA (pT1b, pN0(sn), cM0, G2, ER+, PR+, HER2-, Oncotype DX score: 16) - Signed by Agrawal, Kavita, MD on 11/17/2022 Stage prefix: Initial diagnosis Method of lymph node assessment: Sentinel lymph node biopsy Multigene prognostic tests performed: Oncotype DX Recurrence score range: Greater than or equal to 11 Histologic grading system: 3 grade system    Patient completed adjuvant radiation therapy and started letrozole  in September 2024.  She later switched to anastrozole  in December 2024.  She has baseline osteopenia of her hip.  She was previously on Boniva  which she stopped after taking it for 3 years.  Interval history-tolerating anastrozole  well without any significant side effects.  She has not experienced drowsiness during driving which she did with letrozole .  Denies any specific breast concerns at this time  ECOG PS- 1 Pain scale- 0   Review of systems- Review of Systems  Constitutional:  Negative for chills, fever, malaise/fatigue and weight loss.  HENT:  Negative for congestion, ear discharge and nosebleeds.   Eyes:  Negative for blurred vision.  Respiratory:  Negative for cough, hemoptysis, sputum production, shortness of breath and wheezing.   Cardiovascular:  Negative for chest pain, palpitations, orthopnea and claudication.  Gastrointestinal:  Negative for abdominal pain, blood in stool, constipation, diarrhea, heartburn, melena, nausea and vomiting.  Genitourinary:  Negative for dysuria, flank pain, frequency, hematuria and urgency.  Musculoskeletal:  Negative for back pain, joint pain and myalgias.  Skin:  Negative for rash.  Neurological:  Negative for dizziness, tingling, focal weakness, seizures, weakness  and headaches.  Endo/Heme/Allergies:  Does not bruise/bleed easily.  Psychiatric/Behavioral:  Negative for depression and suicidal ideas. The patient does not have insomnia.       Allergies  Allergen Reactions   Bee Venom Swelling   Hydrocodone  Other (See Comments)    Pt can only take half of 5 mg hydrocodone  and then 2 hours later the other half   Ivp Dye [Iodinated Contrast Media] Rash   Penicillins Rash   Septra [Sulfamethoxazole-Trimethoprim] Rash     Past Medical History:  Diagnosis Date   Breast cancer (HCC) 10/18/2022   Cervical cancer (HCC) 1988   Degenerative disc disease, thoracic    Dry eye syndrome    Family history of adverse reaction to anesthesia    dad-hard to intubate   GERD (gastroesophageal reflux disease)    Glaucoma    Headache    h/o migraines   Hyperlipidemia    Hypertension    PAT (paroxysmal atrial tachycardia) (HCC)    Personal history of radiation therapy    Skin cancer    Neck  Past Surgical History:  Procedure Laterality Date   ABDOMINAL HYSTERECTOMY     BREAST BIOPSY Right    neg bx/clip   BREAST BIOPSY Right 10/11/2022   u/s bx, 3:30 RIBBON clip-path pendinf   BREAST BIOPSY Right 10/11/2022   US  RT BREAST BX W LOC DEV 1ST LESION IMG BX SPEC US  GUIDE 10/11/2022 [facility]   BREAST LUMPECTOMY Right 10/26/2022   CATARACT EXTRACTION W/ INTRAOCULAR LENS  IMPLANT, BILATERAL Bilateral    COLONOSCOPY  2018   ESOPHAGOGASTRODUODENOSCOPY     KNEE ARTHROSCOPY WITH SUBCHONDROPLASTY Right 11/25/2019   Procedure: Right knee arthroscopic partial medial meniscectomy with subchondroplasty of the medial tibial plateau.;  Surgeon: Lorri Rota, MD;  Location: [facility] OR[facility];  Service: Orthopedics;  Laterality: Right;   OOPHORECTOMY Right    PART MASTECTOMY,RADIO FREQUENCY LOCALIZER,AXILLARY SENTINEL NODE BIOPSY Right 10/26/2022   Procedure: PART MASTECTOMY,RADIO FREQUENCY LOCALIZER,AXILLARY SENTINEL NODE BIOPSY;  Surgeon: Eldred Grego, MD;   Location: [facility];  Service: General;  Laterality: Right;   SHOULDER SURGERY Left 2014   partial removal of AC joint d/t osteoarthritis   TONSILLECTOMY     TOTAL KNEE ARTHROPLASTY Right 01/26/2022   Procedure: TOTAL KNEE ARTHROPLASTY - RNFA;  Surgeon: Elner Hahn, MD;  Location: [facility] OR[facility];  Service: Orthopedics;  Laterality: Right;    Social History   Socioeconomic History   Marital status: Divorced    Spouse name: Not on file   Number of children: Not on file   Years of education: Not on file   Highest education level: Not on file  Occupational History   Not on file  Tobacco Use   Smoking status: Former    Current packs/day: 0.00    Average packs/day: 1.5 packs/day for 15.0 years (22.5 ttl pk-yrs)    Types: Cigarettes    Start date: 63    Quit date: 59    Years since quitting: 33.4   Smokeless tobacco: Never  Vaping Use   Vaping status: Never Used  Substance and Sexual Activity   Alcohol  use: Yes    Alcohol /week: 7.0 standard drinks of alcohol     Types: 7 Shots of liquor per week    Comment: nightly   Drug use: No   Sexual activity: Not on file  Other Topics Concern   Not on file  Social History Narrative   Not on file   Social Drivers of Health   Financial Resource Strain: Low Risk  (06/09/2023)   Received from [facility]   Overall Financial Resource Strain (CARDIA)    Difficulty of Paying Living Expenses: Not hard at all  Food Insecurity: No Food Insecurity (06/09/2023)   Received from [facility]   Hunger Vital Sign    Worried About Running Out of Food in the Last Year: Never true    Ran Out of Food in the Last Year: Never true  Transportation Needs: No Transportation Needs (06/09/2023)   Received from [facility] - Transportation    In the past 12 months, has lack of transportation kept you from medical appointments or from getting medications?: No    Lack of Transportation (Non-Medical):  No  Physical Activity: Not on file  Stress: Not on file  Social Connections: Not on file  Intimate Partner Violence: Not At Risk (01/26/2022)   Humiliation, Afraid, Rape, and Kick questionnaire    Fear of Current or Ex-Partner: No    Emotionally Abused: No    Physically Abused: No  Sexually Abused: No    Family History  Problem Relation Age of Onset   Breast cancer Mother 52   Kidney cancer Mother 74   Lung cancer Father 71   Prostate cancer Father 52   Pancreatic cancer Cousin        dx 76s   Lymphoma Cousin      Current Outpatient Medications:    acetaminophen  (TYLENOL ) 500 MG tablet, Take 1-2 tablets (500-1,000 mg total) by mouth every 6 (six) hours as needed. (Patient taking differently: Take 1,000 mg by mouth 3 (three) times daily.), Disp: 30 tablet, Rfl: 0   amLODipine  (NORVASC ) 5 MG tablet, Take 5 mg by mouth every morning., Disp: , Rfl:    anastrozole  (ARIMIDEX ) 1 MG tablet, TAKE 1 TABLET BY MOUTH EVERY DAY, Disp: 90 tablet, Rfl: 1   calcium -vitamin D  (OSCAL WITH D) 500-200 MG-UNIT TABS tablet, Take 2 tablets by mouth daily. gummies, Disp: , Rfl:    celecoxib  (CELEBREX ) 200 MG capsule, Take 200 mg by mouth daily., Disp: , Rfl:    hydrochlorothiazide  (HYDRODIURIL ) 25 MG tablet, Take 25 mg by mouth every morning., Disp: , Rfl:    latanoprost  (XALATAN ) 0.005 % ophthalmic solution, Place 1 drop into the right eye at bedtime., Disp: , Rfl:    lisinopril  (PRINIVIL ,ZESTRIL ) 20 MG tablet, Take 20 mg by mouth 2 (two) times daily., Disp: , Rfl:    Melatonin 5 MG TABS, Take 5 mg by mouth at bedtime. , Disp: , Rfl:    Multiple Vitamins-Minerals (CENTRUM SILVER 50+WOMEN) TABS, Take 1 tablet by mouth daily., Disp: , Rfl:    omeprazole (PRILOSEC) 40 MG capsule, Take 40 mg by mouth every morning., Disp: , Rfl:    ondansetron  (ZOFRAN ) 4 MG tablet, Take 1 tablet (4 mg total) by mouth every 6 (six) hours as needed for nausea., Disp: 30 tablet, Rfl: 0   Propylene Glycol (SYSTANE COMPLETE)  0.6 % SOLN, Place 1 drop into both eyes 3 (three) times daily., Disp: , Rfl:    simvastatin  (ZOCOR ) 10 MG tablet, Take 10 mg by mouth at bedtime. , Disp: , Rfl:   Physical exam:  Vitals:   10/03/23 1411  BP: (!) 148/68  Pulse: 78  Resp: 12  Temp: 98.4 F (36.9 C)  TempSrc: Tympanic  SpO2: 100%  Weight: 177 lb 9.6 oz (80.6 kg)  Height: 5\' 6"  (1.676 m)   Physical Exam Cardiovascular:     Rate and Rhythm: Normal rate and regular rhythm.     Heart sounds: Normal heart sounds.  Pulmonary:     Effort: Pulmonary effort is normal.     Breath sounds: Normal breath sounds.  Skin:    General: Skin is warm and dry.  Neurological:     Mental Status: She is alert and oriented to person, place, and time.    Breast exam was performed in seated and lying down position. Patient is status post right lumpectomy with a well-healed surgical scar. No evidence of any palpable masses. No evidence of axillary adenopathy. No evidence of any palpable masses or lumps in the left breast. No evidence of leftt axillary adenopathy   I have personally reviewed labs listed below:    Latest Ref Rng & Units 10/03/2023    2:12 PM  CMP  Glucose 70 - 99 mg/dL 161   BUN 8 - 23 mg/dL 22   Creatinine 0.96 - 1.00 mg/dL 0.45   Sodium 409 - 811 mmol/L 134   Potassium 3.5 - 5.1 mmol/L 3.6  Chloride 98 - 111 mmol/L 98   CO2 22 - 32 mmol/L 27   Calcium  8.9 - 10.3 mg/dL 8.9   Total Protein 6.5 - 8.1 g/dL 7.3   Total Bilirubin 0.0 - 1.2 mg/dL 0.5   Alkaline Phos 38 - 126 U/L 80   AST 15 - 41 U/L 19   ALT 0 - 44 U/L 14       Latest Ref Rng & Units 10/03/2023    2:12 PM  CBC  WBC 4.0 - 10.5 K/uL 7.3   Hemoglobin 12.0 - 15.0 g/dL 40.9   Hematocrit 81.1 - 46.0 % 34.5   Platelets 150 - 400 K/uL 343    I have personally reviewed Radiology images listed below: No images are attached to the encounter.  MM DIAG BREAST TOMO BILATERAL Result Date: 09/27/2023 CLINICAL DATA:  Status post RIGHT lumpectomy June 2024  for invasive mammary carcinoma. Status post radiation. EXAM: DIGITAL DIAGNOSTIC BILATERAL MAMMOGRAM WITH TOMOSYNTHESIS AND CAD TECHNIQUE: Bilateral digital diagnostic mammography and breast tomosynthesis was performed. The images were evaluated with computer-aided detection. COMPARISON:  Previous exam(s). ACR Breast Density Category c: The breasts are heterogeneously dense, which may obscure small masses. FINDINGS: There is density and architectural distortion within the RIGHT breast, consistent with postsurgical changes. These are new in comparison to prior. A questioned asymmetry in the LEFT breast resolves with additional views, consistent with overlapping tissue. No suspicious mass, distortion, or microcalcifications are identified to suggest presence of malignancy. IMPRESSION: No mammographic evidence of malignancy bilaterally. RECOMMENDATION: Recommend bilateral diagnostic mammogram (with RIGHT and LEFT breast ultrasound if deemed necessary) in 1 year. I have discussed the findings and recommendations with the patient. If applicable, a reminder letter will be sent to the patient regarding the next appointment. BI-RADS CATEGORY  2: Benign. Electronically Signed   By: Clancy Crimes M.D.   On: 09/27/2023 14:17     Assessment and plan- Patient is a 78 y.o. 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  Mammogram from 09/27/2023 was unremarkable.  Patient will continue to take Arimidex  ending in September 2029.  Patient has baseline osteopenia involving the right femur neck and I will plan to get a repeat bone density scanIn September 2025.  She will continue with calcium  and vitamin D  in the meanwhile.  I will see her  back in4 months no labs   Visit Diagnosis 1. Osteopenia of neck of right femur   2. Visit for monitoring Arimidex  therapy   3. High risk medication use   4. Encounter for follow-up surveillance of breast cancer      Dr. Seretha Dance, MD, MPH [facility] at [facility] [phone] 10/04/2023 8:33 AM

## 2023-12-17 ENCOUNTER — Encounter: Payer: Self-pay | Admitting: Oncology

## 2023-12-18 ENCOUNTER — Other Ambulatory Visit: Payer: Self-pay

## 2023-12-18 MED ORDER — ANASTROZOLE 1 MG PO TABS: 1.0000 mg | ORAL_TABLET | Freq: Every day | ORAL | 0 refills | Status: DC

## 2024-01-10 ENCOUNTER — Encounter: Payer: Self-pay | Admitting: Oncology

## 2024-01-29 ENCOUNTER — Inpatient Hospital Stay: Attending: Oncology | Admitting: Nurse Practitioner

## 2024-01-29 VITALS — BP 136/58 | HR 70 | Resp 18 | Wt 173.0 lb

## 2024-01-29 DIAGNOSIS — Z79899 Other long term (current) drug therapy: Secondary | ICD-10-CM | POA: Diagnosis not present

## 2024-01-29 DIAGNOSIS — R232 Flushing: Secondary | ICD-10-CM | POA: Diagnosis not present

## 2024-01-29 DIAGNOSIS — Z8042 Family history of malignant neoplasm of prostate: Secondary | ICD-10-CM | POA: Diagnosis not present

## 2024-01-29 DIAGNOSIS — Z923 Personal history of irradiation: Secondary | ICD-10-CM | POA: Insufficient documentation

## 2024-01-29 DIAGNOSIS — Z800 Family history of malignant neoplasm of digestive organs: Secondary | ICD-10-CM | POA: Insufficient documentation

## 2024-01-29 DIAGNOSIS — Z803 Family history of malignant neoplasm of breast: Secondary | ICD-10-CM | POA: Diagnosis not present

## 2024-01-29 DIAGNOSIS — T451X5A Adverse effect of antineoplastic and immunosuppressive drugs, initial encounter: Secondary | ICD-10-CM

## 2024-01-29 DIAGNOSIS — Z08 Encounter for follow-up examination after completed treatment for malignant neoplasm: Secondary | ICD-10-CM | POA: Diagnosis not present

## 2024-01-29 DIAGNOSIS — Z5181 Encounter for therapeutic drug level monitoring: Secondary | ICD-10-CM

## 2024-01-29 DIAGNOSIS — Z801 Family history of malignant neoplasm of trachea, bronchus and lung: Secondary | ICD-10-CM | POA: Insufficient documentation

## 2024-01-29 DIAGNOSIS — Z807 Family history of other malignant neoplasms of lymphoid, hematopoietic and related tissues: Secondary | ICD-10-CM | POA: Diagnosis not present

## 2024-01-29 DIAGNOSIS — Z87891 Personal history of nicotine dependence: Secondary | ICD-10-CM | POA: Diagnosis not present

## 2024-01-29 DIAGNOSIS — M1990 Unspecified osteoarthritis, unspecified site: Secondary | ICD-10-CM | POA: Insufficient documentation

## 2024-01-29 DIAGNOSIS — Z853 Personal history of malignant neoplasm of breast: Secondary | ICD-10-CM | POA: Insufficient documentation

## 2024-01-29 DIAGNOSIS — Z79811 Long term (current) use of aromatase inhibitors: Secondary | ICD-10-CM

## 2024-01-29 DIAGNOSIS — M85851 Other specified disorders of bone density and structure, right thigh: Secondary | ICD-10-CM | POA: Diagnosis not present

## 2024-01-29 DIAGNOSIS — Z8051 Family history of malignant neoplasm of kidney: Secondary | ICD-10-CM | POA: Diagnosis not present

## 2024-01-29 DIAGNOSIS — K209 Esophagitis, unspecified without bleeding: Secondary | ICD-10-CM | POA: Insufficient documentation

## 2024-01-29 DIAGNOSIS — K219 Gastro-esophageal reflux disease without esophagitis: Secondary | ICD-10-CM | POA: Insufficient documentation

## 2024-01-29 DIAGNOSIS — N951 Menopausal and female climacteric states: Secondary | ICD-10-CM | POA: Insufficient documentation

## 2024-01-29 DIAGNOSIS — I10 Essential (primary) hypertension: Secondary | ICD-10-CM | POA: Insufficient documentation

## 2024-01-29 NOTE — Progress Notes (Unsigned)
 Hematology/Oncology Consult note [facility]  [phone] [phone]  Patient Care Team: Eliverto Bette Hover, MD as PCP - General (Family Medicine) Georgina Shasta POUR, RN as Oncology Nurse Navigator Lenn Aran, MD as Consulting Physician (Radiation Oncology) Rodolph Romano, MD as Consulting Physician (General Surgery) Melanee Annah BROCKS, MD as Consulting Physician (Oncology)   Name of the patient: Megan Montgomery  [id number]  31-Mar-1946   Date of visit: 01/29/24  Diagnosis-  Cancer Staging  Breast cancer [facility]) Staging form: Breast, AJCC 8th Edition - Clinical stage from 10/11/2022: Stage IA (cT1b, cN0, cM0, G1, ER+, PR+, HER2-) - Signed by Agrawal, Kavita, MD on 11/17/2022 Stage prefix: Initial diagnosis Histologic grading system: 3 grade system - Pathologic stage from 10/26/2022: Stage IA (pT1b, pN0(sn), cM0, G2, ER+, PR+, HER2-, Oncotype DX score: 16) - Signed by Agrawal, Kavita, MD on 11/17/2022 Stage prefix: Initial diagnosis Method of lymph node assessment: Sentinel lymph node biopsy Multigene prognostic tests performed: Oncotype DX Recurrence score range: Greater than or equal to 11 Histologic grading system: 3 grade system  Chief complaint/ Reason for visit- Routine follow-up of breast cancer  Heme/Onc History:  Oncology History  Breast cancer (HCC)  09/26/2022 Mammogram   Screening mammogram FINDINGS: In the right breast, a possible mass warrants further evaluation. In the left breast, no findings suspicious for malignancy.   IMPRESSION: Further evaluation is suggested for a possible mass in the right breast.  Diagnostic mammogram and ultrasound FINDINGS: Spot tomosynthesis views of the right breast demonstrate a 10 mm spiculated mass in the inner central right breast middle depth (spot CC image 29/43, spot MLO image 25/48). This corresponds with the mass seen on screening mammogram.   Targeted right breast ultrasound  was performed. At 3:30 o'clock 5 cm from the nipple, there is a spiculated hypoechoic mass that measures 9 x 6 x 9 mm. There is no definite internal vascularity. This corresponds with the spiculated mass seen on mammogram.   Targeted right axillary ultrasound demonstrates multiple morphologically benign lymph nodes. No lymphadenopathy.   IMPRESSION: Right breast 9 mm spiculated mass in the 3:30 o'clock position is concerning for malignancy. Recommend further assessment with ultrasound-guided biopsy   10/11/2022 Pathology Results   DIAGNOSIS:  A. BREAST, RIGHT 3:30 5 CMFN; ULTRASOUND-GUIDED BIOPSY:  - INVASIVE MAMMARY CARCINOMA, NO SPECIAL TYPE.  Size of invasive carcinoma: 7 mm in this sample  Histologic grade of invasive carcinoma: Grade 1                       Glandular/tubular differentiation score: 2                       Nuclear pleomorphism score: 2                       Mitotic rate score: 1                       Total score: 5  Ductal carcinoma in situ: Present, intermediate grade  Lymphovascular invasion: Not identified   ADDENDUM:  CASE SUMMARY: BREAST BIOMARKER TESTS  Estrogen Receptor (ER) Status: POSITIVE          Percentage of cells with nuclear positivity: Greater than 90%          Average intensity of staining: Strong   Progesterone Receptor (PgR) Status: POSITIVE          Percentage of cells  with nuclear positivity: 10-20%          Average intensity of staining: Moderate   HER2 (by immunohistochemistry): EQUIVOCAL (Score 2+)   CASE SUMMARY: BREAST BIOMARKER TESTS TEST(S) PERFORMED: HER2 (by in situ hybridization): NEGATIVE, Group 5 Number of observers: 2 Number of invasive tumor cells counted: 40 Dual probe assay    Average number of HER2 signals per cell: 2.0    Average number of CEP17 signals per cell: 1.7    HER2/CEP17 ratio: 1.18     10/11/2022 Cancer Staging   Staging form: Breast, AJCC 8th Edition - Clinical stage from 10/11/2022: Stage IA (cT1b,  cN0, cM0, G1, ER+, PR+, HER2-) - Signed by Agrawal, Kavita, MD on 11/17/2022 Stage prefix: Initial diagnosis Histologic grading system: 3 grade system   10/26/2022 Cancer Staging   Staging form: Breast, AJCC 8th Edition - Pathologic stage from 10/26/2022: Stage IA (pT1b, pN0(sn), cM0, G2, ER+, PR+, HER2-, Oncotype DX score: 16) - Signed by Agrawal, Kavita, MD on 11/17/2022 Stage prefix: Initial diagnosis Method of lymph node assessment: Sentinel lymph node biopsy Multigene prognostic tests performed: Oncotype DX Recurrence score range: Greater than or equal to 11 Histologic grading system: 3 grade system    Patient completed adjuvant radiation therapy and started letrozole  in September 2024. She later switched to anastrozole  in December 2024. She has baseline osteopenia of her hip. She was previously on Boniva  which she stopped after taking it for 3 years.    Interval History- tolerating anastrozole  well without any significant side effects. She complains of hot flashes at night that at times interfere with sleep. She has chronic fullness and sensory changes of right breast post operatively. Denies any breast masses or skin changes. No unintentional weight loss or unexplained bone pain.   ECOG PS- 1 Pain scale- 0  Review of systems- Review of Systems  Constitutional:  Negative for chills, fever, malaise/fatigue and weight loss.  HENT:  Negative for congestion, ear discharge and nosebleeds.   Eyes:  Negative for blurred vision.  Respiratory:  Negative for cough, hemoptysis, sputum production, shortness of breath and wheezing.   Cardiovascular:  Negative for chest pain, palpitations, orthopnea and claudication.  Gastrointestinal:  Negative for abdominal pain, blood in stool, constipation, diarrhea, heartburn, melena, nausea and vomiting.  Genitourinary:  Negative for dysuria, flank pain, frequency, hematuria and urgency.  Musculoskeletal:  Negative for back pain, joint pain and myalgias.   Skin:  Negative for rash.  Neurological:  Negative for dizziness, tingling, focal weakness, seizures, weakness and headaches.  Endo/Heme/Allergies:  Does not bruise/bleed easily.  Psychiatric/Behavioral:  Negative for depression and suicidal ideas. The patient does not have insomnia.     Allergies  Allergen Reactions   Bee Venom Swelling   Hydrocodone  Other (See Comments)    Pt can only take half of 5 mg hydrocodone  and then 2 hours later the other half   Ivp Dye [Iodinated Contrast Media] Rash   Penicillins Rash   Septra [Sulfamethoxazole-Trimethoprim] Rash   Past Medical History:  Diagnosis Date   Breast cancer (HCC) 10/18/2022   Cervical cancer (HCC) 1988   Degenerative disc disease, thoracic    Dry eye syndrome    Family history of adverse reaction to anesthesia    dad-hard to intubate   GERD (gastroesophageal reflux disease)    Glaucoma    Headache    h/o migraines   Hyperlipidemia    Hypertension    PAT (paroxysmal atrial tachycardia)    Personal history of radiation therapy  Skin cancer    Neck   Past Surgical History:  Procedure Laterality Date   ABDOMINAL HYSTERECTOMY     BREAST BIOPSY Right    neg bx/clip   BREAST BIOPSY Right 10/11/2022   u/s bx, 3:30 RIBBON clip-path pendinf   BREAST BIOPSY Right 10/11/2022   US  RT BREAST BX W LOC DEV 1ST LESION IMG BX SPEC US  GUIDE 10/11/2022 [facility]   BREAST LUMPECTOMY Right 10/26/2022   CATARACT EXTRACTION W/ INTRAOCULAR LENS  IMPLANT, BILATERAL Bilateral    COLONOSCOPY  2018   ESOPHAGOGASTRODUODENOSCOPY     KNEE ARTHROSCOPY WITH SUBCHONDROPLASTY Right 11/25/2019   Procedure: Right knee arthroscopic partial medial meniscectomy with subchondroplasty of the medial tibial plateau.;  Surgeon: Tobie Priest, MD;  Location: [facility] ORS;  Service: Orthopedics;  Laterality: Right;   OOPHORECTOMY Right    PART MASTECTOMY,RADIO FREQUENCY LOCALIZER,AXILLARY SENTINEL NODE BIOPSY Right 10/26/2022   Procedure: PART  MASTECTOMY,RADIO FREQUENCY LOCALIZER,AXILLARY SENTINEL NODE BIOPSY;  Surgeon: Rodolph Romano, MD;  Location: [facility] ORS;  Service: General;  Laterality: Right;   SHOULDER SURGERY Left 2014   partial removal of AC joint d/t osteoarthritis   TONSILLECTOMY     TOTAL KNEE ARTHROPLASTY Right 01/26/2022   Procedure: TOTAL KNEE ARTHROPLASTY - RNFA;  Surgeon: Edie Norleen PARAS, MD;  Location: [facility] ORS;  Service: Orthopedics;  Laterality: Right;   Social History   Socioeconomic History   Marital status: Divorced    Spouse name: Not on file   Number of children: Not on file   Years of education: Not on file   Highest education level: Not on file  Occupational History   Not on file  Tobacco Use   Smoking status: Former    Current packs/day: 0.00    Average packs/day: 1.5 packs/day for 15.0 years (22.5 ttl pk-yrs)    Types: Cigarettes    Start date: 33    Quit date: 50    Years since quitting: 33.7   Smokeless tobacco: Never  Vaping Use   Vaping status: Never Used  Substance and Sexual Activity   Alcohol  use: Yes    Alcohol /week: 7.0 standard drinks of alcohol     Types: 7 Shots of liquor per week    Comment: nightly   Drug use: No   Sexual activity: Not on file  Other Topics Concern   Not on file  Social History Narrative   Not on file   Social Drivers of Health   Financial Resource Strain: Low Risk  (01/07/2024)   Received from [facility]   Overall Financial Resource Strain (CARDIA)    Difficulty of Paying Living Expenses: Not hard at all  Food Insecurity: No Food Insecurity (01/07/2024)   Received from [facility]   Hunger Vital Sign    Within the past 12 months, you worried that your food would run out before you got the money to buy more.: Never true    Within the past 12 months, the food you bought just didn't last and you didn't have money to get more.: Never true  Transportation Needs: No Transportation Needs (01/07/2024)    Received from [facility] - Transportation    In the past 12 months, has lack of transportation kept you from medical appointments or from getting medications?: No    Lack of Transportation (Non-Medical): No  Physical Activity: Not on file  Stress: Not on file  Social Connections: Not on file  Intimate Partner Violence: Not At Risk (01/26/2022)  Humiliation, Afraid, Rape, and Kick questionnaire    Fear of Current or Ex-Partner: No    Emotionally Abused: No    Physically Abused: No    Sexually Abused: No   Family History  Problem Relation Age of Onset   Breast cancer Mother 64   Kidney cancer Mother 54   Lung cancer Father 55   Prostate cancer Father 81   Pancreatic cancer Cousin        dx 48s   Lymphoma Cousin    Current Outpatient Medications:    acetaminophen  (TYLENOL ) 500 MG tablet, Take 1-2 tablets (500-1,000 mg total) by mouth every 6 (six) hours as needed. (Patient taking differently: Take 1,000 mg by mouth 3 (three) times daily.), Disp: 30 tablet, Rfl: 0   amLODipine  (NORVASC ) 5 MG tablet, Take 5 mg by mouth every morning., Disp: , Rfl:    anastrozole  (ARIMIDEX ) 1 MG tablet, Take 1 tablet (1 mg total) by mouth daily., Disp: 90 tablet, Rfl: 0   calcium -vitamin D  (OSCAL WITH D) 500-200 MG-UNIT TABS tablet, Take 2 tablets by mouth daily. gummies, Disp: , Rfl:    celecoxib  (CELEBREX ) 200 MG capsule, Take 200 mg by mouth daily., Disp: , Rfl:    hydrochlorothiazide  (HYDRODIURIL ) 25 MG tablet, Take 25 mg by mouth every morning., Disp: , Rfl:    latanoprost  (XALATAN ) 0.005 % ophthalmic solution, Place 1 drop into the right eye at bedtime., Disp: , Rfl:    lisinopril  (PRINIVIL ,ZESTRIL ) 20 MG tablet, Take 20 mg by mouth 2 (two) times daily., Disp: , Rfl:    Melatonin 5 MG TABS, Take 5 mg by mouth at bedtime. , Disp: , Rfl:    Multiple Vitamins-Minerals (CENTRUM SILVER 50+WOMEN) TABS, Take 1 tablet by mouth daily., Disp: , Rfl:    omeprazole (PRILOSEC) 40 MG  capsule, Take 40 mg by mouth every morning., Disp: , Rfl:    ondansetron  (ZOFRAN ) 4 MG tablet, Take 1 tablet (4 mg total) by mouth every 6 (six) hours as needed for nausea., Disp: 30 tablet, Rfl: 0   Propylene Glycol (SYSTANE COMPLETE) 0.6 % SOLN, Place 1 drop into both eyes 3 (three) times daily., Disp: , Rfl:    simvastatin  (ZOCOR ) 10 MG tablet, Take 10 mg by mouth at bedtime. , Disp: , Rfl:   Physical Exam:  Vitals:   01/29/24 1310 01/29/24 1312  BP:  (!) 136/58  Pulse:  70  Resp: 18 18  SpO2:  100%  Weight: 173 lb (78.5 kg)    Physical Exam Cardiovascular:     Rate and Rhythm: Normal rate and regular rhythm.     Heart sounds: Normal heart sounds.  Pulmonary:     Effort: Pulmonary effort is normal.     Breath sounds: Normal breath sounds.  Skin:    General: Skin is warm and dry.  Neurological:     Mental Status: She is alert and oriented to person, place, and time.   Breast exam was performed in seated and lying down position. Patient is status post right lumpectomy with a well-healed surgical scar. No evidence of any palpable masses. No fullness appreciated. No evidence of axillary adenopathy. No evidence of any palpable masses or lumps in the left breast. No evidence of leftt axillary adenopathy  Lab Review No labs on site today  Imaging Review I have personally reviewed radiology images listed below:  09/27/23- MM 3D Diagnostic Mammogram Bilateral Breast   CLINICAL DATA:  Status post RIGHT lumpectomy June 2024 for invasive mammary carcinoma. Status  post radiation.   EXAM: DIGITAL DIAGNOSTIC BILATERAL MAMMOGRAM WITH  TOMOSYNTHESIS AND CAD   TECHNIQUE: Bilateral digital diagnostic mammography and breast tomosynthesis was performed. The images were evaluated with computer-aided detection.   COMPARISON:  Previous exam(s).   ACR Breast Density Category c: The breasts are heterogeneously dense, which may obscure small masses.   FINDINGS: There is density and  architectural distortion within the RIGHT breast, consistent with postsurgical changes. These are new in comparison to prior. A questioned asymmetry in the LEFT breast resolves with additional views, consistent with overlapping tissue. No suspicious mass, distortion, or microcalcifications are identified to suggest presence of malignancy.   IMPRESSION: No mammographic evidence of malignancy bilaterally.   RECOMMENDATION: Recommend bilateral diagnostic mammogram (with RIGHT and LEFT breast ultrasound if deemed necessary) in 1 year.   I have discussed the findings and recommendations with the patient. If applicable, a reminder letter will be sent to the patient regarding the next appointment.   BI-RADS CATEGORY  2: Benign.    Electronically Signed   By: Corean Salter M.D.   On: 09/27/2023 14:17  No results found.    Assessment and plan- Patient is a 78 y.o. 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  No evidence of recurrent disease on exam.  Clinically asymptomatic of recurrence.  Last mammogram 09/27/23 was bi-rads category 2: benign. Plan to complete at least 5 years of AI therapy completing in September 2029.  Osteopenia- baseline osteopenia of right femur neck. Previously took boniva  with pcp x 3 years. Plan to repeat bone density scan. Continue calcium  and vitamin D  along with weight bearing exercise as tolerated.  Constipation & Diarrhea- evaluated by PCO. Awaiting GI appointment.  Hot flashes- primarily at night. May be related to AI therapy. Plan to trial gabapentin  100 mg at bedtime.  If symptoms are well-controlled she can follow-up with Dr. Melanee as scheduled.  Otherwise, I would like to see her back to discuss medication adjustments.  Disposition:   Bone density (prefers [place] location if an option) 4 mo- see Dr Melanee for AI f/u & breast surveillance- la    Visit Diagnosis 1. Encounter for follow-up surveillance of breast cancer   2. Encounter for monitoring aromatase inhibitor therapy   3. Hot flashes related to aromatase inhibitor therapy   4. Osteopenia of neck of right femur    Tinnie Dawn, DNP, AGNP-C, AOCNP [facility] [phone] (clinic) 01/29/2024

## 2024-01-29 NOTE — Progress Notes (Unsigned)
 Will be seeing GI for issues.

## 2024-01-30 ENCOUNTER — Encounter: Payer: Self-pay | Admitting: Nurse Practitioner

## 2024-01-30 DIAGNOSIS — M85851 Other specified disorders of bone density and structure, right thigh: Secondary | ICD-10-CM | POA: Insufficient documentation

## 2024-01-30 DIAGNOSIS — T451X5A Adverse effect of antineoplastic and immunosuppressive drugs, initial encounter: Secondary | ICD-10-CM | POA: Insufficient documentation

## 2024-01-30 DIAGNOSIS — M8580 Other specified disorders of bone density and structure, unspecified site: Secondary | ICD-10-CM | POA: Insufficient documentation

## 2024-01-30 MED ORDER — GABAPENTIN 100 MG PO CAPS: 100.0000 mg | ORAL_CAPSULE | Freq: Every day | ORAL | 3 refills | Status: AC

## 2024-02-02 ENCOUNTER — Ambulatory Visit: Admitting: Oncology

## 2024-02-28 ENCOUNTER — Encounter: Payer: Self-pay | Admitting: Nurse Practitioner

## 2024-02-28 ENCOUNTER — Ambulatory Visit: Admitting: Radiation Oncology

## 2024-02-29 ENCOUNTER — Ambulatory Visit
Admission: RE | Admit: 2024-02-29 | Discharge: 2024-02-29 | Disposition: A | Discharge status: Home / Self Care | Source: Ambulatory Visit | Attending: Radiation Oncology | Admitting: Radiation Oncology

## 2024-02-29 ENCOUNTER — Encounter: Payer: Self-pay | Admitting: Radiation Oncology

## 2024-02-29 VITALS — BP 157/74 | HR 81 | Temp 97.0°F | Resp 15 | Ht 66.0 in | Wt 175.0 lb

## 2024-02-29 DIAGNOSIS — C50311 Malignant neoplasm of lower-inner quadrant of right female breast: Secondary | ICD-10-CM | POA: Insufficient documentation

## 2024-02-29 DIAGNOSIS — Z1721 Progesterone receptor positive status: Secondary | ICD-10-CM | POA: Insufficient documentation

## 2024-02-29 DIAGNOSIS — Z170 Estrogen receptor positive status [ER+]: Secondary | ICD-10-CM | POA: Diagnosis not present

## 2024-02-29 DIAGNOSIS — Z923 Personal history of irradiation: Secondary | ICD-10-CM | POA: Diagnosis not present

## 2024-02-29 DIAGNOSIS — Z79811 Long term (current) use of aromatase inhibitors: Secondary | ICD-10-CM | POA: Insufficient documentation

## 2024-02-29 NOTE — Progress Notes (Signed)
 Radiation Oncology Follow up Note  Name: Megan Montgomery   Date:   02/29/2024 MRN:  [id number] DOB: Oct 04, 1945    This 78 y.o. female presents to the clinic today for 34-month follow-up status post whole breast radiation to her right breast for stage Ia ER/PR positive invasive mammary carcinoma.  REFERRING PROVIDER: Eliverto Bette Hover, *  HPI: Patient is a 78 year old female now out 13 months having completed whole breast radiation to her right breast for stage Ia ER/PR positive invasive mammary carcinoma.  Seen today she still has pain and tenderness in her right breast and her right axilla.  This is again most likely related to prior surgery and scar formation.  She had a mammogram.  Back in May which I have reviewed was BI-RADS 2 benign.  She is also on Arimidex  tolerating that well without side effect.  COMPLICATIONS OF TREATMENT: none  FOLLOW UP COMPLIANCE: keeps appointments   PHYSICAL EXAM:  BP (!) 157/74 Comment: patient states usually runs 130s over 70s.  Pulse 81   Temp (!) 97 F (36.1 C)   Resp 15   Ht 5' 6 (1.676 m)   Wt 175 lb (79.4 kg)   BMI 28.25 kg/m  Lungs are clear to A&P cardiac examination essentially unremarkable with regular rate and rhythm. No dominant mass or nodularity is noted in either breast in 2 positions examined. Incision is well-healed. No axillary or supraclavicular adenopathy is appreciated. Cosmetic result is excellent.  Well-developed well-nourished patient in NAD. HEENT reveals PERLA, EOMI, discs not visualized.  Oral cavity is clear. No oral mucosal lesions are identified. Neck is clear without evidence of cervical or supraclavicular adenopathy. Lungs are clear to A&P. Cardiac examination is essentially unremarkable with regular rate and rhythm without murmur rub or thrill. Abdomen is benign with no organomegaly or masses noted. Motor sensory and DTR levels are equal and symmetric in the upper and lower extremities. Cranial nerves II through XII are  grossly intact. Proprioception is intact. No peripheral adenopathy or edema is identified. No motor or sensory levels are noted. Crude visual fields are within normal range.  RADIOLOGY RESULTS: Mammogram reviewed compatible with above-stated findings  PLAN: Present time patient is doing well now at 13 months with no evidence of disease.  I am pleased with her overall progress.  She is continues close follow-up care on a biannual basis with medical oncology and I am going to turn follow-up care over to them.  I would be happy to reevaluate the patient anytime should that be indicated.  I would like to take this opportunity to thank you for allowing me to participate in the care of your patient.SABRA Marcey Penton, MD

## 2024-03-16 ENCOUNTER — Other Ambulatory Visit: Payer: Self-pay | Admitting: Oncology

## 2024-04-17 ENCOUNTER — Ambulatory Visit
Admission: RE | Admit: 2024-04-17 | Discharge: 2024-04-17 | Disposition: A | Discharge status: Home / Self Care | Source: Ambulatory Visit | Attending: Oncology | Admitting: Oncology

## 2024-04-17 DIAGNOSIS — M85851 Other specified disorders of bone density and structure, right thigh: Secondary | ICD-10-CM | POA: Diagnosis present

## 2024-04-29 ENCOUNTER — Encounter: Payer: Self-pay | Admitting: Emergency Medicine

## 2024-04-29 ENCOUNTER — Ambulatory Visit
Admission: EM | Admit: 2024-04-29 | Discharge: 2024-04-29 | Disposition: A | Discharge status: Home / Self Care | Attending: Emergency Medicine | Admitting: Emergency Medicine

## 2024-04-29 DIAGNOSIS — D492 Neoplasm of unspecified behavior of bone, soft tissue, and skin: Secondary | ICD-10-CM | POA: Insufficient documentation

## 2024-04-29 DIAGNOSIS — R229 Localized swelling, mass and lump, unspecified: Secondary | ICD-10-CM | POA: Insufficient documentation

## 2024-04-29 DIAGNOSIS — N182 Chronic kidney disease, stage 2 (mild): Secondary | ICD-10-CM | POA: Insufficient documentation

## 2024-04-29 DIAGNOSIS — G43909 Migraine, unspecified, not intractable, without status migrainosus: Secondary | ICD-10-CM | POA: Insufficient documentation

## 2024-04-29 DIAGNOSIS — R10813 Right lower quadrant abdominal tenderness: Secondary | ICD-10-CM | POA: Insufficient documentation

## 2024-04-29 DIAGNOSIS — M25569 Pain in unspecified knee: Secondary | ICD-10-CM | POA: Insufficient documentation

## 2024-04-29 DIAGNOSIS — K5732 Diverticulitis of large intestine without perforation or abscess without bleeding: Secondary | ICD-10-CM | POA: Insufficient documentation

## 2024-04-29 DIAGNOSIS — R030 Elevated blood-pressure reading, without diagnosis of hypertension: Secondary | ICD-10-CM | POA: Insufficient documentation

## 2024-04-29 DIAGNOSIS — D049 Carcinoma in situ of skin, unspecified: Secondary | ICD-10-CM | POA: Insufficient documentation

## 2024-04-29 DIAGNOSIS — F40298 Other specified phobia: Secondary | ICD-10-CM | POA: Insufficient documentation

## 2024-04-29 DIAGNOSIS — C4490 Unspecified malignant neoplasm of skin, unspecified: Secondary | ICD-10-CM | POA: Insufficient documentation

## 2024-04-29 DIAGNOSIS — M25512 Pain in left shoulder: Secondary | ICD-10-CM | POA: Insufficient documentation

## 2024-04-29 DIAGNOSIS — J069 Acute upper respiratory infection, unspecified: Secondary | ICD-10-CM | POA: Diagnosis not present

## 2024-04-29 DIAGNOSIS — M5412 Radiculopathy, cervical region: Secondary | ICD-10-CM | POA: Insufficient documentation

## 2024-04-29 DIAGNOSIS — M19012 Primary osteoarthritis, left shoulder: Secondary | ICD-10-CM | POA: Insufficient documentation

## 2024-04-29 DIAGNOSIS — R140 Abdominal distension (gaseous): Secondary | ICD-10-CM | POA: Insufficient documentation

## 2024-04-29 DIAGNOSIS — M542 Cervicalgia: Secondary | ICD-10-CM | POA: Insufficient documentation

## 2024-04-29 DIAGNOSIS — L219 Seborrheic dermatitis, unspecified: Secondary | ICD-10-CM | POA: Insufficient documentation

## 2024-04-29 DIAGNOSIS — K5730 Diverticulosis of large intestine without perforation or abscess without bleeding: Secondary | ICD-10-CM | POA: Insufficient documentation

## 2024-04-29 LAB — POCT INFLUENZA A/B
Influenza A, POC: NEGATIVE
Influenza B, POC: NEGATIVE

## 2024-04-29 LAB — POC SOFIA SARS ANTIGEN FIA: SARS Coronavirus 2 Ag: NEGATIVE

## 2024-04-29 MED ORDER — BENZONATATE 100 MG PO CAPS: 200.0000 mg | ORAL_CAPSULE | Freq: Three times a day (TID) | ORAL | 0 refills | Status: AC

## 2024-04-29 MED ORDER — IPRATROPIUM BROMIDE 0.06 % NA SOLN: 2.0000 | Freq: Four times a day (QID) | NASAL | 12 refills | Status: AC

## 2024-04-29 MED ORDER — PROMETHAZINE-DM 6.25-15 MG/5ML PO SYRP: 5.0000 mL | ORAL_SOLUTION | Freq: Four times a day (QID) | ORAL | 0 refills | Status: AC | PRN

## 2024-04-29 NOTE — ED Provider Notes (Signed)
 " [facility]    CSN: [id number] Arrival date & time: 04/29/24  1720      History   Chief Complaint Chief Complaint  Patient presents with   Cough   Sore Throat    HPI Megan Montgomery is a 78 y.o. female.   HPI  78 year old female with past medical history significant for cellulitis, hypertension, hyperlipidemia, chronic kidney disease stage II, migraine headaches, and diverticulosis who presents for evaluation of respiratory symptoms that started yesterday.  She reports that she was with her grandson on Friday who is coughing a lot.  The next day he woke up with a temperature and was diagnosed with influenza.  She denies any fever, shortness breath, wheezing but she does endorse runny nose, nasal congestion, ear pain, sore throat, and a cough that has been productive for a scant amount of yellow mucus.  Past Medical History:  Diagnosis Date   Breast cancer (HCC) 10/18/2022   Cervical cancer (HCC) 1988   Degenerative disc disease, thoracic    Dry eye syndrome    Family history of adverse reaction to anesthesia    dad-hard to intubate   GERD (gastroesophageal reflux disease)    Glaucoma    Headache    h/o migraines   Hyperlipidemia    Hypertension    PAT (paroxysmal atrial tachycardia)    Personal history of radiation therapy    Skin cancer    Neck    Patient Active Problem List   Diagnosis Date Noted   Arthralgia of knee 04/29/2024   Bloating 04/29/2024   Carcinoma in situ of skin 04/29/2024   Cervical radiculopathy 04/29/2024   Diverticulitis of colon 04/29/2024   Diverticulosis of colon 04/29/2024   Finding of above normal blood pressure 04/29/2024   Migraine headache 04/29/2024   Neck pain 04/29/2024   Neoplasm of skin of lower leg 04/29/2024   Neoplasm of skin of upper arm 04/29/2024   Pain in 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   Primary osteoarthritis, 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   Right lower quadrant abdominal tenderness 04/29/2024   Seborrhea 04/29/2024    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   Skin cancer 04/29/2024   Stage 2 chronic kidney disease 04/29/2024   Soft tissue swelling 04/29/2024   Hot flashes related to aromatase inhibitor therapy 01/30/2024   Osteopenia 01/30/2024   Hypertension 01/29/2024   Esophagitis 01/29/2024   Osteoarthritis 01/29/2024   Long term current use of aromatase inhibitor 01/24/2023   At risk for loss of bone density 01/24/2023   Other specified counseling 11/03/2022   History of cervical cancer 10/18/2022   Benign neoplasm of breast 10/18/2022   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   Status post total knee replacement using cement, 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   Primary osteoarthritis of right knee 12/29/2021   Spondylosis 09/23/2021   Partial thickness rotator cuff tear 09/23/2021   Abnormal ECG 10/09/2020   SOBOE (shortness of breath on exertion) 10/09/2020   Cellulitis 05/17/2017   Chronic midline low back pain without sciatica 10/12/2016   SI (sacroiliac) pain 10/12/2016   Hyperlipidemia 10/06/2016    Past Surgical History:  Procedure Laterality Date   ABDOMINAL HYSTERECTOMY     BREAST BIOPSY Right    neg bx/clip   BREAST BIOPSY Right 10/11/2022   u/s bx, 3:30 RIBBON clip-path pendinf   BREAST BIOPSY Right 10/11/2022   US  RT BREAST BX W LOC DEV 1ST LESION IMG BX SPEC US  GUIDE 10/11/2022 [facility]   BREAST LUMPECTOMY Right 10/26/2022  CATARACT EXTRACTION W/ INTRAOCULAR LENS  IMPLANT, BILATERAL Bilateral    COLONOSCOPY  2018   ESOPHAGOGASTRODUODENOSCOPY     KNEE ARTHROSCOPY WITH SUBCHONDROPLASTY Right 11/25/2019   Procedure: Right knee arthroscopic partial medial meniscectomy with subchondroplasty of the medial tibial plateau.;  Surgeon: Tobie Priest, MD;  Location: [facility];  Service: Orthopedics;  Laterality: Right;   OOPHORECTOMY Right    PART MASTECTOMY,RADIO FREQUENCY LOCALIZER,AXILLARY SENTINEL NODE BIOPSY Right 10/26/2022   Procedure: PART MASTECTOMY,RADIO FREQUENCY  LOCALIZER,AXILLARY SENTINEL NODE BIOPSY;  Surgeon: Rodolph Romano, MD;  Location: [facility];  Service: General;  Laterality: Right;   SHOULDER SURGERY Left 2014   partial removal of AC joint d/t osteoarthritis   TONSILLECTOMY     TOTAL KNEE ARTHROPLASTY Right 01/26/2022   Procedure: TOTAL KNEE ARTHROPLASTY - RNFA;  Surgeon: Edie Norleen PARAS, MD;  Location: [facility];  Service: Orthopedics;  Laterality: Right;    OB History   No obstetric history on file.      Home Medications    Prior to Admission medications  Medication Sig Start Date End Date Taking? Authorizing Provider  benzonatate  (TESSALON ) 100 MG capsule Take 2 capsules (200 mg total) by mouth every 8 (eight) hours. 04/29/24  Yes Bernardino Ditch, NP  ipratropium (ATROVENT ) 0.06 % nasal spray Place 2 sprays into both nostrils 4 (four) times daily. 04/29/24  Yes Bernardino Ditch, NP  promethazine -dextromethorphan (PROMETHAZINE -DM) 6.25-15 MG/5ML syrup Take 5 mLs by mouth 4 (four) times daily as needed. 04/29/24  Yes Bernardino Ditch, NP  acetaminophen  (TYLENOL ) 500 MG tablet Take 1-2 tablets (500-1,000 mg total) by mouth every 6 (six) hours as needed. Patient taking differently: Take 1,000 mg by mouth 3 (three) times daily. 01/28/22   Kip Lynwood Double, PA-C  amLODipine  (NORVASC ) 5 MG tablet Take 5 mg by mouth every morning.    [provider]  anastrozole  (ARIMIDEX ) 1 MG tablet TAKE 1 TABLET BY MOUTH EVERY DAY 03/18/24   Melanee Annah BROCKS, MD  calcium -vitamin D  (OSCAL WITH D) 500-200 MG-UNIT TABS tablet Take 2 tablets by mouth daily. gummies 11/11/16   [provider]  celecoxib  (CELEBREX ) 200 MG capsule Take 200 mg by mouth daily. 05/28/23   [provider]  gabapentin  (NEURONTIN ) 100 MG capsule Take 1 capsule (100 mg total) by mouth at bedtime. 01/30/24   Dasie Tinnie MATSU, NP  hydrochlorothiazide  (HYDRODIURIL ) 25 MG tablet Take 25 mg by mouth every morning. 02/22/17   [provider]  latanoprost  (XALATAN )  0.005 % ophthalmic solution Place 1 drop into the right eye at bedtime. 11/14/19   [provider]  lisinopril  (PRINIVIL ,ZESTRIL ) 20 MG tablet Take 20 mg by mouth 2 (two) times daily. 02/22/17   [provider]  Melatonin 5 MG TABS Take 5 mg by mouth at bedtime.     [provider]  Multiple Vitamins-Minerals (CENTRUM SILVER 50+WOMEN) TABS Take 1 tablet by mouth daily.    [provider]  omeprazole (PRILOSEC) 40 MG capsule Take 40 mg by mouth every morning. 03/23/17   [provider]  ondansetron  (ZOFRAN ) 4 MG tablet Take 1 tablet (4 mg total) by mouth every 6 (six) hours as needed for nausea. 01/28/22   Kip Lynwood Double, PA-C  Propylene Glycol (SYSTANE COMPLETE) 0.6 % SOLN Place 1 drop into both eyes 3 (three) times daily.    [provider]  simvastatin  (ZOCOR ) 10 MG tablet Take 10 mg by mouth at bedtime.  03/08/17   [provider]    Family History Family History  Problem Relation Age of Onset   Breast cancer Mother 64   Kidney cancer Mother 61   Lung cancer Father 11   Prostate cancer Father 28   Pancreatic cancer Cousin        dx 71s   Lymphoma Cousin     Social History Social History[1]   Allergies   Bee venom, Hydrocodone , Ivp dye [iodinated contrast media], Penicillins, and Septra [sulfamethoxazole-trimethoprim]   Review of Systems Review of Systems  Constitutional:  Negative for fever.  HENT:  Positive for congestion, ear pain, rhinorrhea and sore throat.   Respiratory:  Positive for cough. Negative for shortness of breath and wheezing.      Physical Exam Triage Vital Signs ED Triage Vitals  Encounter Vitals Group     BP      Girls Systolic BP Percentile      Girls Diastolic BP Percentile      Boys Systolic BP Percentile      Boys Diastolic BP Percentile      Pulse      Resp      Temp      Temp src      SpO2      Weight      Height      Head Circumference      Peak Flow      Pain Score       Pain Loc      Pain Education      Exclude from Growth Chart    No data found.  Updated Vital Signs BP (!) 162/82 (BP Location: Left Arm)   Pulse 92   Temp 98.6 F (37 C) (Oral)   Resp 18   Wt 174 lb 9.6 oz (79.2 kg)   SpO2 96%   BMI 28.18 kg/m   Visual Acuity Right Eye Distance:   Left Eye Distance:   Bilateral Distance:    Right Eye Near:   Left Eye Near:    Bilateral Near:     Physical Exam Vitals and nursing note reviewed.  Constitutional:      Appearance: Normal appearance. She is not ill-appearing.  HENT:     Head: Normocephalic and atraumatic.     Right Ear: Tympanic membrane, ear canal and external ear normal. There is no impacted cerumen.     Left Ear: Tympanic membrane, ear canal and external ear normal. There is no impacted cerumen.     Nose: Congestion and rhinorrhea present.     Comments: Nasal mucosa is edematous and erythematous with clear discharge in both nares.    Mouth/Throat:     Mouth: Mucous membranes are moist.     Pharynx: Oropharynx is clear. No oropharyngeal exudate or posterior oropharyngeal erythema.  Cardiovascular:     Rate and Rhythm: Normal rate and regular rhythm.     Pulses: Normal pulses.     Heart sounds: Normal heart sounds. No murmur heard.    No friction rub. No gallop.  Pulmonary:     Effort: Pulmonary effort is normal.     Breath sounds: Normal breath sounds. No wheezing, rhonchi or rales.  Musculoskeletal:     Cervical back: Normal range of motion and neck supple. No tenderness.  Lymphadenopathy:     Cervical: No cervical adenopathy.  Skin:    General: Skin is warm and dry.     Capillary Refill: Capillary refill takes less than 2 seconds.     Findings: No rash.  Neurological:     General: No  focal deficit present.     Mental Status: She is alert and oriented to person, place, and time.      UC Treatments / Results  Labs (all labs ordered are listed, but only abnormal results are displayed) Labs Reviewed   POC SOFIA SARS ANTIGEN FIA - Normal  POCT INFLUENZA A/B - Normal    EKG   Radiology No results found.  Procedures Procedures (including critical care time)  Medications Ordered in UC Medications - No data to display  Initial Impression / Assessment and Plan / UC Course  I have reviewed the triage vital signs and the nursing notes.  Pertinent labs & imaging results that were available during my care of the patient were reviewed by me and considered in my medical decision making (see chart for details).   Patient is a nontoxic-appearing 78 year old female presenting for evaluation of respiratory symptoms as outlined HPI above.  She was exposed to influenza by her grandson 3 days ago.  Patient reports that her symptoms began yesterday.  Differential diagnose include COVID, influenza, viral illness.  I will order a COVID and influenza antigen test.  COVID antigen test is negative.  Influenza antigen test is negative.  I will discharge patient on the diagnosis of viral URI with cough.  Prescribe Atrovent  nasal spray for nasal congestion and Tessalon  Perles and Promethazine  DM cough syrup for cough and congestion.  She may use over-the-counter Tylenol  and/or ibuprofen as needed for any fever or pain.  Return precautions reviewed.     Final Clinical Impressions(s) / UC Diagnoses   Final diagnoses:  Viral URI with cough     Discharge Instructions      Your testing today was negative for both COVID and influenza.  I do believe you have a respiratory virus which is causing your symptoms.  Please use over-the-counter Tylenol  and/or ibuprofen according to the package instructions as needed for any fever or pain.  Use the Atrovent  nasal spray, 2 squirts in each nostril every 6 hours, as needed for runny nose and postnasal drip.  Use the Tessalon  Perles every 8 hours during the day.  Take them with a small sip of water.  They may give you some numbness to the base of your tongue  or a metallic taste in your mouth, this is normal.  Use the Promethazine  DM cough syrup at bedtime for cough and congestion.  It will make you drowsy so do not take it during the day.  Return for reevaluation or see your primary care provider for any new or worsening symptoms.      ED Prescriptions     Medication Sig Dispense Auth. Provider   benzonatate  (TESSALON ) 100 MG capsule Take 2 capsules (200 mg total) by mouth every 8 (eight) hours. 21 capsule Bernardino Ditch, NP   ipratropium (ATROVENT ) 0.06 % nasal spray Place 2 sprays into both nostrils 4 (four) times daily. 15 mL Bernardino Ditch, NP   promethazine -dextromethorphan (PROMETHAZINE -DM) 6.25-15 MG/5ML syrup Take 5 mLs by mouth 4 (four) times daily as needed. 118 mL Bernardino Ditch, NP      PDMP not reviewed this encounter.     [1]  Social History Tobacco Use   Smoking status: Former    Current packs/day: 0.00    Average packs/day: 1.5 packs/day for 15.0 years (22.5 ttl pk-yrs)    Types: Cigarettes    Start date: 64    Quit date: 39    Years since quitting: 33.9   Smokeless tobacco: Never  Vaping Use   Vaping status: Never Used  Substance Use Topics   Alcohol  use: Yes    Alcohol /week: 7.0 standard drinks of alcohol     Types: 7 Shots of liquor per week    Comment: nightly   Drug use: No     Bernardino Ditch, NP 04/29/24 1907  "

## 2024-04-29 NOTE — ED Triage Notes (Signed)
 Pt c/o cough & congestion that started today. Denies fevers. States was exposed to flu. Has tried OTC meds w/o relief.

## 2024-04-29 NOTE — Discharge Instructions (Signed)
 Your testing today was negative for both COVID and influenza.  I do believe you have a respiratory virus which is causing your symptoms.  Please use over-the-counter Tylenol  and/or ibuprofen according to the package instructions as needed for any fever or pain.  Use the Atrovent  nasal spray, 2 squirts in each nostril every 6 hours, as needed for runny nose and postnasal drip.  Use the Tessalon  Perles every 8 hours during the day.  Take them with a small sip of water.  They may give you some numbness to the base of your tongue or a metallic taste in your mouth, this is normal.  Use the Promethazine  DM cough syrup at bedtime for cough and congestion.  It will make you drowsy so do not take it during the day.  Return for reevaluation or see your primary care provider for any new or worsening symptoms.

## 2024-06-04 ENCOUNTER — Other Ambulatory Visit: Payer: Self-pay | Admitting: Internal Medicine

## 2024-06-04 ENCOUNTER — Inpatient Hospital Stay: Attending: Oncology | Admitting: Oncology

## 2024-06-04 ENCOUNTER — Encounter: Payer: Self-pay | Admitting: Oncology

## 2024-06-04 ENCOUNTER — Other Ambulatory Visit: Payer: Self-pay

## 2024-06-04 VITALS — BP 122/45 | HR 74 | Temp 98.0°F | Resp 19 | Ht 66.0 in | Wt 171.6 lb

## 2024-06-04 DIAGNOSIS — M85851 Other specified disorders of bone density and structure, right thigh: Secondary | ICD-10-CM | POA: Diagnosis not present

## 2024-06-04 DIAGNOSIS — Z08 Encounter for follow-up examination after completed treatment for malignant neoplasm: Secondary | ICD-10-CM

## 2024-06-04 DIAGNOSIS — M85852 Other specified disorders of bone density and structure, left thigh: Secondary | ICD-10-CM | POA: Insufficient documentation

## 2024-06-04 DIAGNOSIS — Z170 Estrogen receptor positive status [ER+]: Secondary | ICD-10-CM | POA: Diagnosis not present

## 2024-06-04 DIAGNOSIS — Z5181 Encounter for therapeutic drug level monitoring: Secondary | ICD-10-CM

## 2024-06-04 DIAGNOSIS — Z923 Personal history of irradiation: Secondary | ICD-10-CM | POA: Diagnosis not present

## 2024-06-04 DIAGNOSIS — D0511 Intraductal carcinoma in situ of right breast: Secondary | ICD-10-CM | POA: Diagnosis present

## 2024-06-04 DIAGNOSIS — C50311 Malignant neoplasm of lower-inner quadrant of right female breast: Secondary | ICD-10-CM | POA: Diagnosis not present

## 2024-06-04 DIAGNOSIS — Z79899 Other long term (current) drug therapy: Secondary | ICD-10-CM

## 2024-06-04 DIAGNOSIS — Z87891 Personal history of nicotine dependence: Secondary | ICD-10-CM | POA: Diagnosis not present

## 2024-06-04 DIAGNOSIS — Z79811 Long term (current) use of aromatase inhibitors: Secondary | ICD-10-CM | POA: Diagnosis not present

## 2024-06-04 NOTE — Progress Notes (Signed)
 "    Hematology/Oncology Consult note [facility]  [phone] [phone]  Patient Care Team: Eliverto Bette Hover, MD as PCP - General (Family Medicine) Georgina Shasta POUR, RN as Oncology Nurse Navigator Lenn Aran, MD as Consulting Physician (Radiation Oncology) Rodolph Romano, MD as Consulting Physician (General Surgery) Melanee Annah BROCKS, MD as Consulting Physician (Oncology)   Name of the patient: Megan Montgomery  [id number]  05/27/1945   Date of visit: 06/04/24  Diagnosis-  Cancer Staging  Benign neoplasm of breast Staging form: Breast, AJCC 8th Edition - Clinical stage from 10/11/2022: Stage IA (cT1b, cN0, cM0, G1, ER+, PR+, HER2-) - Signed by Agrawal, Kavita, MD on 11/17/2022 Stage prefix: Initial diagnosis Histologic grading system: 3 grade system - Pathologic stage from 10/26/2022: Stage IA (pT1b, pN0(sn), cM0, G2, ER+, PR+, HER2-, Oncotype DX score: 16) - Signed by Agrawal, Kavita, MD on 11/17/2022 Stage prefix: Initial diagnosis Method of lymph node assessment: Sentinel lymph node biopsy Multigene prognostic tests performed: Oncotype DX Recurrence score range: Greater than or equal to 11 Histologic grading system: 3 grade system    Chief complaint/ Reason for visit-routine follow-up visit for breast cancer  Heme/Onc history:  Oncology History  Benign neoplasm of breast  09/26/2022 Mammogram   Screening mammogram FINDINGS: In the right breast, a possible mass warrants further evaluation. In the left breast, no findings suspicious for malignancy.   IMPRESSION: Further evaluation is suggested for a possible mass in the right breast.  Diagnostic mammogram and ultrasound FINDINGS: Spot tomosynthesis views of the right breast demonstrate a 10 mm spiculated mass in the inner central right breast middle depth (spot CC image 29/43, spot MLO image 25/48). This corresponds with the mass seen on screening mammogram.   Targeted  right breast ultrasound was performed. At 3:30 o'clock 5 cm from the nipple, there is a spiculated hypoechoic mass that measures 9 x 6 x 9 mm. There is no definite internal vascularity. This corresponds with the spiculated mass seen on mammogram.   Targeted right axillary ultrasound demonstrates multiple morphologically benign lymph nodes. No lymphadenopathy.   IMPRESSION: Right breast 9 mm spiculated mass in the 3:30 o'clock position is concerning for malignancy. Recommend further assessment with ultrasound-guided biopsy   10/11/2022 Pathology Results   DIAGNOSIS:  A. BREAST, RIGHT 3:30 5 CMFN; ULTRASOUND-GUIDED BIOPSY:  - INVASIVE MAMMARY CARCINOMA, NO SPECIAL TYPE.  Size of invasive carcinoma: 7 mm in this sample  Histologic grade of invasive carcinoma: Grade 1                       Glandular/tubular differentiation score: 2                       Nuclear pleomorphism score: 2                       Mitotic rate score: 1                       Total score: 5  Ductal carcinoma in situ: Present, intermediate grade  Lymphovascular invasion: Not identified   ADDENDUM:  CASE SUMMARY: BREAST BIOMARKER TESTS  Estrogen Receptor (ER) Status: POSITIVE          Percentage of cells with nuclear positivity: Greater than 90%          Average intensity of staining: Strong   Progesterone Receptor (PgR) Status: POSITIVE  Percentage of cells with nuclear positivity: 10-20%          Average intensity of staining: Moderate   HER2 (by immunohistochemistry): EQUIVOCAL (Score 2+)   CASE SUMMARY: BREAST BIOMARKER TESTS TEST(S) PERFORMED: HER2 (by in situ hybridization): NEGATIVE, Group 5 Number of observers: 2 Number of invasive tumor cells counted: 40 Dual probe assay    Average number of HER2 signals per cell: 2.0    Average number of CEP17 signals per cell: 1.7    HER2/CEP17 ratio: 1.18     10/11/2022 Cancer Staging   Staging form: Breast, AJCC 8th Edition - Clinical stage from  10/11/2022: Stage IA (cT1b, cN0, cM0, G1, ER+, PR+, HER2-) - Signed by Agrawal, Kavita, MD on 11/17/2022 Stage prefix: Initial diagnosis Histologic grading system: 3 grade system   10/26/2022 Cancer Staging   Staging form: Breast, AJCC 8th Edition - Pathologic stage from 10/26/2022: Stage IA (pT1b, pN0(sn), cM0, G2, ER+, PR+, HER2-, Oncotype DX score: 16) - Signed by Agrawal, Kavita, MD on 11/17/2022 Stage prefix: Initial diagnosis Method of lymph node assessment: Sentinel lymph node biopsy Multigene prognostic tests performed: Oncotype DX Recurrence score range: Greater than or equal to 11 Histologic grading system: 3 grade system   Patient completed adjuvant radiation therapy and started letrozole  in September 2024. She later switched to anastrozole  in December 2024. She has baseline osteopenia of her hip. She was previously on Boniva  which she stopped after taking it for 3 years.    Interval history- Megan Montgomery is a 79 year old female with estrogen receptor positive right breast cancer on adjuvant anastrozole  who presents for oncology follow-up and review of bone density scan results.  She continues adjuvant endocrine therapy with anastrozole , with no significant adverse effects. She reports occasional bilateral breast discomfort but denies new breast symptoms, changes in appetite, or weight loss. She is scheduled to complete five years of anastrozole  in September 2029 and is due for her next annual mammogram in May 2026.  Her December 2025 bone density scan showed a lowest T-score of -1.7 in the left hip and similar scores in the right hip compared to prior studies. The 10-year probability of major osteoporotic fracture is 13.7%, and hip fracture risk is 3.4%. She previously took Boniva  for several years but discontinued after relocating; she is currently not on bisphosphonate therapy.  She takes daily calcium  and vitamin D  supplementation, with some missed doses during a recent viral  illness and decreased appetite, but has resumed regular intake. Last week, she sustained an ankle injury and knee pain after being knocked down by her son's dogs, landing on her hips but without hip pain or evidence of fracture. She reports poor appetite and increased sleep, which she attributes to being homebound during winter, and notes low physical activity.       ECOG PS- 1 Pain scale- 0   Review of systems- Review of Systems  Constitutional:  Negative for chills, fever, malaise/fatigue and weight loss.  HENT:  Negative for congestion, ear discharge and nosebleeds.   Eyes:  Negative for blurred vision.  Respiratory:  Negative for cough, hemoptysis, sputum production, shortness of breath and wheezing.   Cardiovascular:  Negative for chest pain, palpitations, orthopnea and claudication.  Gastrointestinal:  Negative for abdominal pain, blood in stool, constipation, diarrhea, heartburn, melena, nausea and vomiting.  Genitourinary:  Negative for dysuria, flank pain, frequency, hematuria and urgency.  Musculoskeletal:  Negative for back pain, joint pain and myalgias.  Skin:  Negative for rash.  Neurological:  Negative for dizziness, tingling, focal weakness, seizures, weakness and headaches.  Endo/Heme/Allergies:  Does not bruise/bleed easily.  Psychiatric/Behavioral:  Negative for depression and suicidal ideas. The patient does not have insomnia.       Allergies[1]   Past Medical History:  Diagnosis Date   Breast cancer (HCC) 10/18/2022   Cervical cancer (HCC) 1988   Degenerative disc disease, thoracic    Dry eye syndrome    Family history of adverse reaction to anesthesia    dad-hard to intubate   GERD (gastroesophageal reflux disease)    Glaucoma    Headache    h/o migraines   Hyperlipidemia    Hypertension    PAT (paroxysmal atrial tachycardia)    Personal history of radiation therapy    Skin cancer    Neck     Past Surgical History:  Procedure Laterality Date    ABDOMINAL HYSTERECTOMY     BREAST BIOPSY Right    neg bx/clip   BREAST BIOPSY Right 10/11/2022   u/s bx, 3:30 RIBBON clip-path pendinf   BREAST BIOPSY Right 10/11/2022   US  RT BREAST BX W LOC DEV 1ST LESION IMG BX SPEC US  GUIDE 10/11/2022 [facility]   BREAST LUMPECTOMY Right 10/26/2022   CATARACT EXTRACTION W/ INTRAOCULAR LENS  IMPLANT, BILATERAL Bilateral    COLONOSCOPY  2018   ESOPHAGOGASTRODUODENOSCOPY     KNEE ARTHROSCOPY WITH SUBCHONDROPLASTY Right 11/25/2019   Procedure: Right knee arthroscopic partial medial meniscectomy with subchondroplasty of the medial tibial plateau.;  Surgeon: Tobie Priest, MD;  Location: [facility] ORS;  Service: Orthopedics;  Laterality: Right;   OOPHORECTOMY Right    PART MASTECTOMY,RADIO FREQUENCY LOCALIZER,AXILLARY SENTINEL NODE BIOPSY Right 10/26/2022   Procedure: PART MASTECTOMY,RADIO FREQUENCY LOCALIZER,AXILLARY SENTINEL NODE BIOPSY;  Surgeon: Rodolph Romano, MD;  Location: [facility] OR[facility];  Service: General;  Laterality: Right;   SHOULDER SURGERY Left 2014   partial removal of AC joint d/t osteoarthritis   TONSILLECTOMY     TOTAL KNEE ARTHROPLASTY Right 01/26/2022   Procedure: TOTAL KNEE ARTHROPLASTY - RNFA;  Surgeon: Edie Norleen PARAS, MD;  Location: [facility] OR[facility];  Service: Orthopedics;  Laterality: Right;    Social History   Socioeconomic History   Marital status: Divorced    Spouse name: Not on file   Number of children: Not on file   Years of education: Not on file   Highest education level: Not on file  Occupational History   Not on file  Tobacco Use   Smoking status: Former    Current packs/day: 0.00    Average packs/day: 1.5 packs/day for 15.0 years (22.5 ttl pk-yrs)    Types: Cigarettes    Start date: 47    Quit date: 34    Years since quitting: 34.0   Smokeless tobacco: Never  Vaping Use   Vaping status: Never Used  Substance and Sexual Activity   Alcohol  use: Yes    Alcohol /week: 7.0 standard drinks of alcohol     Types: 7 Shots  of liquor per week    Comment: nightly   Drug use: No   Sexual activity: Not on file  Other Topics Concern   Not on file  Social History Narrative   Not on file   Social Drivers of Health   Tobacco Use: Medium Risk (06/04/2024)   Patient History    Smoking Tobacco Use: Former    Smokeless Tobacco Use: Never    Passive Exposure: Not on file  Financial Resource Strain: Low Risk  (05/20/2024)   Received from [facility]   Overall Financial Resource Strain (CARDIA)    Difficulty of Paying Living Expenses: Not hard at all  Food Insecurity: No Food Insecurity (05/20/2024)   Received from [facility]   Epic    Within the past 12 months, you worried that your food would run out before you got the money to buy more.: Never true    Within the past 12 months, the food you bought just didn't last and you didn't have money to get more.: Never true  Transportation Needs: No Transportation Needs (05/20/2024)   Received from [facility] - Transportation    In the past 12 months, has lack of transportation kept you from medical appointments or from getting medications?: No    Lack of Transportation (Non-Medical): No  Physical Activity: Not on file  Stress: Not on file  Social Connections: Not on file  Intimate Partner Violence: Not At Risk (01/26/2022)   Humiliation, Afraid, Rape, and Kick questionnaire    Fear of Current or Ex-Partner: No    Emotionally Abused: No    Physically Abused: No    Sexually Abused: No  Depression (PHQ2-9): Low Risk (06/04/2024)   Depression (PHQ2-9)    PHQ-2 Score: 0  Alcohol  Screen: Not on file  Housing: Low Risk  (05/20/2024)   Received from [facility]   Epic    In the last 12 months, was there a time when you were not able to pay the mortgage or rent on time?: No    In the past 12 months, how many times have you moved where you were living?: 0    At any time in the past 12 months,  were you homeless or living in a shelter (including now)?: No  Utilities: Not At Risk (05/20/2024)   Received from [facility]   Epic    In the past 12 months has the electric, gas, oil, or water company threatened to shut off services in your home?: No  Health Literacy: Not on file    Family History  Problem Relation Age of Onset   Breast cancer Mother 60   Kidney cancer Mother 74   Lung cancer Father 18   Prostate cancer Father 56   Pancreatic cancer Cousin        dx 47s   Lymphoma Cousin     Current Medications[2]  Physical exam:  Vitals:   06/04/24 1417  BP: (!) 122/45  Pulse: 74  Resp: 19  Temp: 98 F (36.7 C)  TempSrc: Tympanic  SpO2: 99%  Weight: 171 lb 9.6 oz (77.8 kg)  Height: 5' 6 (1.676 m)   Physical Exam Cardiovascular:     Rate and Rhythm: Normal rate and regular rhythm.     Heart sounds: Normal heart sounds.  Pulmonary:     Effort: Pulmonary effort is normal.     Breath sounds: Normal breath sounds.  Skin:    General: Skin is warm and dry.  Neurological:     Mental Status: She is alert and oriented to person, place, and time.    Breast exam was performed in seated and lying down position. Patient is status post right lumpectomy with a well-healed surgical scar. No evidence of any palpable masses. No evidence of axillary adenopathy. No evidence of any palpable masses or lumps in the left breast. No evidence of leftt axillary adenopathy   I have personally reviewed labs listed below:    Latest Ref  Rng & Units 10/03/2023    2:12 PM  CMP  Glucose 70 - 99 mg/dL 885   BUN 8 - 23 mg/dL 22   Creatinine 9.55 - 1.00 mg/dL 9.21   Sodium 864 - 854 mmol/L 134   Potassium 3.5 - 5.1 mmol/L 3.6   Chloride 98 - 111 mmol/L 98   CO2 22 - 32 mmol/L 27   Calcium  8.9 - 10.3 mg/dL 8.9   Total Protein 6.5 - 8.1 g/dL 7.3   Total Bilirubin 0.0 - 1.2 mg/dL 0.5   Alkaline Phos 38 - 126 U/L 80   AST 15 - 41 U/L 19   ALT 0 - 44 U/L 14        Latest Ref Rng & Units 10/03/2023    2:12 PM  CBC  WBC 4.0 - 10.5 K/uL 7.3   Hemoglobin 12.0 - 15.0 g/dL 88.2   Hematocrit 63.9 - 46.0 % 34.5   Platelets 150 - 400 K/uL 343      Assessment and plan- Patient is a 79 y.o. 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  She is presently on Arimidex  and this is a routine follow-up visit for breast cancer  Assessment and Plan    Estrogen receptor positive right breast cancer - Clinically patient is doing well with no concerning signs and symptoms of recurrence based on today's exam Receiving anastrozole  with good tolerance and no new breast symptoms.- Continued anastrozole  therapy until September 2029. - Ordered annual screening mammogram for May 2026. - Scheduled follow-up in six months.  Osteopenia Bone density stable with hip T-scores unchanged. Major osteoporotic fracture risk below threshold; hip fracture risk slightly elevated. Discontinued Boniva ; anastrozole  may impact bone density. Resuming calcium  and vitamin D  supplementation. - Continued calcium  and vitamin D  supplementation. - Repeat bone density scan in May or June 2027. - Monitor vitamin D  levels at next visit in six months. - Boniva  held; reassess after next bone density scan. - Scheduled follow-up in six months.         Visit Diagnosis 1. High risk medication use   2. Encounter for follow-up surveillance of breast cancer   3. Visit for monitoring Arimidex  therapy   4. Osteopenia of neck of right femur      Dr. Annah Skene, MD, MPH [facility] [phone] 06/04/2024 2:46 PM                   [1]  Allergies Allergen Reactions   Bee Venom Swelling   Hydrocodone  Other (See Comments)    Pt can only take half of 5 mg hydrocodone  and then 2 hours later the other half   Ivp Dye [Iodinated  Contrast Media] Rash   Penicillins Rash   Septra [Sulfamethoxazole-Trimethoprim] Rash  [2]  Current Outpatient Medications:    acetaminophen  (TYLENOL ) 500 MG tablet, Take 1-2 tablets (500-1,000 mg total) by mouth every 6 (six) hours as needed. (Patient taking differently: Take 1,000 mg by mouth 3 (three) times daily.), Disp: 30 tablet, Rfl: 0   amLODipine  (NORVASC ) 5 MG tablet, Take 5 mg by mouth every morning., Disp: , Rfl:    anastrozole  (ARIMIDEX ) 1 MG tablet, TAKE 1 TABLET BY MOUTH EVERY DAY, Disp: 90 tablet, Rfl: 0   calcium -vitamin D  (OSCAL WITH D) 500-200 MG-UNIT TABS tablet, Take 2 tablets by mouth daily. gummies, Disp: , Rfl:  celecoxib  (CELEBREX ) 200 MG capsule, Take 200 mg by mouth daily., Disp: , Rfl:    gabapentin  (NEURONTIN ) 100 MG capsule, Take 1 capsule (100 mg total) by mouth at bedtime., Disp: 60 capsule, Rfl: 3   hydrochlorothiazide  (HYDRODIURIL ) 25 MG tablet, Take 25 mg by mouth every morning., Disp: , Rfl:    latanoprost  (XALATAN ) 0.005 % ophthalmic solution, Place 1 drop into the right eye at bedtime., Disp: , Rfl:    lisinopril  (PRINIVIL ,ZESTRIL ) 20 MG tablet, Take 20 mg by mouth 2 (two) times daily., Disp: , Rfl:    Melatonin 5 MG TABS, Take 5 mg by mouth at bedtime. , Disp: , Rfl:    Multiple Vitamins-Minerals (CENTRUM SILVER 50+WOMEN) TABS, Take 1 tablet by mouth daily., Disp: , Rfl:    omeprazole (PRILOSEC) 40 MG capsule, Take 40 mg by mouth every morning., Disp: , Rfl:    ondansetron  (ZOFRAN ) 4 MG tablet, Take 1 tablet (4 mg total) by mouth every 6 (six) hours as needed for nausea., Disp: 30 tablet, Rfl: 0   Propylene Glycol (SYSTANE COMPLETE) 0.6 % SOLN, Place 1 drop into both eyes 3 (three) times daily., Disp: , Rfl:    simvastatin  (ZOCOR ) 10 MG tablet, Take 10 mg by mouth at bedtime. , Disp: , Rfl:    benzonatate  (TESSALON ) 100 MG capsule, Take 2 capsules (200 mg total) by mouth every 8 (eight) hours., Disp: 21 capsule, Rfl: 0   ipratropium (ATROVENT ) 0.06 %  nasal spray, Place 2 sprays into both nostrils 4 (four) times daily., Disp: 15 mL, Rfl: 12   promethazine -dextromethorphan (PROMETHAZINE -DM) 6.25-15 MG/5ML syrup, Take 5 mLs by mouth 4 (four) times daily as needed., Disp: 118 mL, Rfl: 0  "

## 2024-06-04 NOTE — Progress Notes (Signed)
 Patient doing okay; currently recovering from a fall (no fractures). No new or acute concerns at this time.

## 2024-06-12 ENCOUNTER — Other Ambulatory Visit: Payer: Self-pay | Admitting: Oncology

## 2024-12-02 ENCOUNTER — Inpatient Hospital Stay

## 2024-12-02 ENCOUNTER — Inpatient Hospital Stay: Admitting: Oncology

## 2024-12-03 ENCOUNTER — Inpatient Hospital Stay

## 2024-12-03 ENCOUNTER — Inpatient Hospital Stay: Admitting: Oncology
